# Patient Record
Sex: Female | Born: 1935 | Race: White | Hispanic: No | Smoking: Current every day smoker
Health system: Southern US, Community
[De-identification: ages and names within clinical notes are randomized; demographics above are authoritative.]

## PROBLEM LIST (undated history)

## (undated) DIAGNOSIS — Z923 Personal history of irradiation: Secondary | ICD-10-CM

## (undated) DIAGNOSIS — C3490 Malignant neoplasm of unspecified part of unspecified bronchus or lung: Secondary | ICD-10-CM

## (undated) DIAGNOSIS — I10 Essential (primary) hypertension: Secondary | ICD-10-CM

## (undated) DIAGNOSIS — T4145XA Adverse effect of unspecified anesthetic, initial encounter: Secondary | ICD-10-CM

## (undated) DIAGNOSIS — C801 Malignant (primary) neoplasm, unspecified: Secondary | ICD-10-CM

## (undated) DIAGNOSIS — Z8619 Personal history of other infectious and parasitic diseases: Secondary | ICD-10-CM

## (undated) DIAGNOSIS — G9340 Encephalopathy, unspecified: Secondary | ICD-10-CM

## (undated) DIAGNOSIS — M1990 Unspecified osteoarthritis, unspecified site: Secondary | ICD-10-CM

## (undated) DIAGNOSIS — G4734 Idiopathic sleep related nonobstructive alveolar hypoventilation: Secondary | ICD-10-CM

## (undated) DIAGNOSIS — J449 Chronic obstructive pulmonary disease, unspecified: Secondary | ICD-10-CM

## (undated) DIAGNOSIS — T8859XA Other complications of anesthesia, initial encounter: Secondary | ICD-10-CM

## (undated) DIAGNOSIS — Z720 Tobacco use: Secondary | ICD-10-CM

## (undated) DIAGNOSIS — I739 Peripheral vascular disease, unspecified: Secondary | ICD-10-CM

## (undated) HISTORY — PX: OTHER SURGICAL HISTORY: SHX169

## (undated) HISTORY — PX: APPENDECTOMY: SHX54

## (undated) HISTORY — DX: Idiopathic sleep related nonobstructive alveolar hypoventilation: G47.34

## (undated) HISTORY — DX: Personal history of other infectious and parasitic diseases: Z86.19

## (undated) HISTORY — DX: Personal history of irradiation: Z92.3

## (undated) HISTORY — PX: TONSILLECTOMY: SUR1361

## (undated) HISTORY — DX: Tobacco use: Z72.0

## (undated) HISTORY — DX: Chronic obstructive pulmonary disease, unspecified: J44.9

## (undated) HISTORY — DX: Essential (primary) hypertension: I10

## (undated) HISTORY — DX: Peripheral vascular disease, unspecified: I73.9

---

## 1983-12-11 DIAGNOSIS — Z8619 Personal history of other infectious and parasitic diseases: Secondary | ICD-10-CM

## 1983-12-11 HISTORY — DX: Personal history of other infectious and parasitic diseases: Z86.19

## 1999-12-12 ENCOUNTER — Other Ambulatory Visit: Admission: RE | Admit: 1999-12-12 | Discharge: 1999-12-12 | Payer: Self-pay | Admitting: Cardiology

## 2000-02-01 ENCOUNTER — Ambulatory Visit (HOSPITAL_COMMUNITY): Admission: RE | Admit: 2000-02-01 | Discharge: 2000-02-01 | Payer: Self-pay | Admitting: Gastroenterology

## 2000-02-01 ENCOUNTER — Encounter (INDEPENDENT_AMBULATORY_CARE_PROVIDER_SITE_OTHER): Payer: Self-pay | Admitting: Specialist

## 2003-10-22 ENCOUNTER — Encounter (INDEPENDENT_AMBULATORY_CARE_PROVIDER_SITE_OTHER): Payer: Self-pay | Admitting: Specialist

## 2003-10-22 ENCOUNTER — Encounter: Admission: RE | Admit: 2003-10-22 | Discharge: 2003-10-22 | Payer: Self-pay | Admitting: General Surgery

## 2006-12-10 HISTORY — PX: ELBOW SURGERY: SHX618

## 2007-02-14 ENCOUNTER — Encounter: Admission: RE | Admit: 2007-02-14 | Discharge: 2007-03-16 | Payer: Self-pay | Admitting: Orthopedic Surgery

## 2007-03-17 ENCOUNTER — Encounter: Admission: RE | Admit: 2007-03-17 | Discharge: 2007-05-20 | Payer: Self-pay | Admitting: Orthopedic Surgery

## 2007-07-28 ENCOUNTER — Ambulatory Visit (HOSPITAL_COMMUNITY): Admission: RE | Admit: 2007-07-28 | Discharge: 2007-07-28 | Payer: Self-pay | Admitting: Internal Medicine

## 2007-08-13 ENCOUNTER — Ambulatory Visit: Payer: Self-pay | Admitting: Internal Medicine

## 2007-09-25 ENCOUNTER — Ambulatory Visit: Payer: Self-pay | Admitting: Internal Medicine

## 2009-05-05 ENCOUNTER — Ambulatory Visit: Payer: Self-pay | Admitting: Emergency Medicine

## 2009-05-05 ENCOUNTER — Inpatient Hospital Stay (HOSPITAL_COMMUNITY): Admission: EM | Admit: 2009-05-05 | Discharge: 2009-05-23 | Payer: Self-pay | Admitting: Emergency Medicine

## 2009-05-05 ENCOUNTER — Ambulatory Visit: Payer: Self-pay | Admitting: Cardiology

## 2009-05-08 ENCOUNTER — Encounter (INDEPENDENT_AMBULATORY_CARE_PROVIDER_SITE_OTHER): Payer: Self-pay | Admitting: Internal Medicine

## 2009-06-23 ENCOUNTER — Ambulatory Visit (HOSPITAL_COMMUNITY): Admission: RE | Admit: 2009-06-23 | Discharge: 2009-06-23 | Payer: Self-pay | Admitting: Internal Medicine

## 2009-07-04 ENCOUNTER — Encounter: Admission: RE | Admit: 2009-07-04 | Discharge: 2009-08-30 | Payer: Self-pay | Admitting: Internal Medicine

## 2010-07-15 ENCOUNTER — Encounter: Admission: RE | Admit: 2010-07-15 | Discharge: 2010-07-15 | Payer: Self-pay | Admitting: Internal Medicine

## 2010-12-21 ENCOUNTER — Inpatient Hospital Stay (HOSPITAL_COMMUNITY)
Admission: AD | Admit: 2010-12-21 | Discharge: 2010-12-26 | Payer: Self-pay | Source: Home / Self Care | Attending: Internal Medicine | Admitting: Internal Medicine

## 2010-12-25 LAB — DIFFERENTIAL
Basophils Absolute: 0 10*3/uL (ref 0.0–0.1)
Basophils Absolute: 0 10*3/uL (ref 0.0–0.1)
Basophils Relative: 0 % (ref 0–1)
Basophils Relative: 0 % (ref 0–1)
Eosinophils Absolute: 0 10*3/uL (ref 0.0–0.7)
Eosinophils Absolute: 0 10*3/uL (ref 0.0–0.7)
Eosinophils Relative: 0 % (ref 0–5)
Eosinophils Relative: 0 % (ref 0–5)
Lymphocytes Relative: 7 % — ABNORMAL LOW (ref 12–46)
Lymphocytes Relative: 8 % — ABNORMAL LOW (ref 12–46)
Lymphs Abs: 1 10*3/uL (ref 0.7–4.0)
Lymphs Abs: 1.7 10*3/uL (ref 0.7–4.0)
Monocytes Absolute: 0.3 10*3/uL (ref 0.1–1.0)
Monocytes Absolute: 0.5 10*3/uL (ref 0.1–1.0)
Monocytes Relative: 2 % — ABNORMAL LOW (ref 3–12)
Monocytes Relative: 2 % — ABNORMAL LOW (ref 3–12)
Neutro Abs: 13.2 10*3/uL — ABNORMAL HIGH (ref 1.7–7.7)
Neutro Abs: 20.5 10*3/uL — ABNORMAL HIGH (ref 1.7–7.7)
Neutrophils Relative %: 91 % — ABNORMAL HIGH (ref 43–77)
Neutrophils Relative %: 90 % — ABNORMAL HIGH (ref 43–77)

## 2010-12-25 LAB — COMPREHENSIVE METABOLIC PANEL
ALT: 20 U/L (ref 0–35)
ALT: 34 U/L (ref 0–35)
AST: 27 U/L (ref 0–37)
AST: 35 U/L (ref 0–37)
Albumin: 2.9 g/dL — ABNORMAL LOW (ref 3.5–5.2)
Albumin: 2.5 g/dL — ABNORMAL LOW (ref 3.5–5.2)
Alkaline Phosphatase: 58 U/L (ref 39–117)
Alkaline Phosphatase: 57 U/L (ref 39–117)
BUN: 6 mg/dL (ref 6–23)
BUN: 14 mg/dL (ref 6–23)
CO2: 28 mEq/L (ref 19–32)
CO2: 30 mEq/L (ref 19–32)
Calcium: 9.3 mg/dL (ref 8.4–10.5)
Calcium: 9.2 mg/dL (ref 8.4–10.5)
Chloride: 96 mEq/L (ref 96–112)
Chloride: 88 mEq/L — ABNORMAL LOW (ref 96–112)
Creatinine, Ser: 0.61 mg/dL (ref 0.4–1.2)
Creatinine, Ser: 0.58 mg/dL (ref 0.4–1.2)
GFR calc Af Amer: >60 mL/min (ref >60)
GFR calc Af Amer: >60 mL/min (ref >60)
GFR calc non Af Amer: >60 mL/min (ref >60)
GFR calc non Af Amer: >60 mL/min (ref >60)
Glucose, Bld: 155 mg/dL — ABNORMAL HIGH (ref 70–99)
Glucose, Bld: 170 mg/dL — ABNORMAL HIGH (ref 70–99)
Potassium: 3.6 mEq/L (ref 3.5–5.1)
Potassium: 3.8 mEq/L (ref 3.5–5.1)
Sodium: 134 mEq/L — ABNORMAL LOW (ref 135–145)
Sodium: 126 mEq/L — ABNORMAL LOW (ref 135–145)
Total Bilirubin: 0.6 mg/dL (ref 0.3–1.2)
Total Bilirubin: 0.4 mg/dL (ref 0.3–1.2)
Total Protein: 7.2 g/dL (ref 6.0–8.3)
Total Protein: 6.1 g/dL (ref 6.0–8.3)

## 2010-12-25 LAB — BASIC METABOLIC PANEL
BUN: 15 mg/dL (ref 6–23)
CO2: 28 mEq/L (ref 19–32)
Calcium: 9.3 mg/dL (ref 8.4–10.5)
Chloride: 88 mEq/L — ABNORMAL LOW (ref 96–112)
Creatinine, Ser: 0.68 mg/dL (ref 0.4–1.2)
GFR calc Af Amer: >60 mL/min (ref >60)
GFR calc non Af Amer: >60 mL/min (ref >60)
Glucose, Bld: 128 mg/dL — ABNORMAL HIGH (ref 70–99)
Potassium: 4.5 mEq/L (ref 3.5–5.1)
Sodium: 126 mEq/L — ABNORMAL LOW (ref 135–145)

## 2010-12-25 LAB — GLUCOSE, CAPILLARY
Glucose-Capillary: 293 mg/dL — ABNORMAL HIGH (ref 70–99)
Glucose-Capillary: 169 mg/dL — ABNORMAL HIGH (ref 70–99)
Glucose-Capillary: 240 mg/dL — ABNORMAL HIGH (ref 70–99)
Glucose-Capillary: 174 mg/dL — ABNORMAL HIGH (ref 70–99)
Glucose-Capillary: 169 mg/dL — ABNORMAL HIGH (ref 70–99)
Glucose-Capillary: 235 mg/dL — ABNORMAL HIGH (ref 70–99)
Glucose-Capillary: 152 mg/dL — ABNORMAL HIGH (ref 70–99)
Glucose-Capillary: 108 mg/dL — ABNORMAL HIGH (ref 70–99)
Glucose-Capillary: 133 mg/dL — ABNORMAL HIGH (ref 70–99)
Glucose-Capillary: 174 mg/dL — ABNORMAL HIGH (ref 70–99)
Glucose-Capillary: 181 mg/dL — ABNORMAL HIGH (ref 70–99)

## 2010-12-25 LAB — CBC
HCT: 37.3 % (ref 36.0–46.0)
HCT: 32.8 % — ABNORMAL LOW (ref 36.0–46.0)
HCT: 34.4 % — ABNORMAL LOW (ref 36.0–46.0)
Hemoglobin: 12.8 g/dL (ref 12.0–15.0)
Hemoglobin: 11.2 g/dL — ABNORMAL LOW (ref 12.0–15.0)
Hemoglobin: 11.7 g/dL — ABNORMAL LOW (ref 12.0–15.0)
MCH: 32.7 pg (ref 26.0–34.0)
MCH: 32 pg (ref 26.0–34.0)
MCH: 31.9 pg (ref 26.0–34.0)
MCHC: 34.3 g/dL (ref 30.0–36.0)
MCHC: 34.1 g/dL (ref 30.0–36.0)
MCHC: 34 g/dL (ref 30.0–36.0)
MCV: 95.2 fL (ref 78.0–100.0)
MCV: 93.7 fL (ref 78.0–100.0)
MCV: 93.7 fL (ref 78.0–100.0)
Platelets: 303 10*3/uL (ref 150–400)
Platelets: 320 10*3/uL (ref 150–400)
Platelets: 393 10*3/uL (ref 150–400)
RBC: 3.92 MIL/uL (ref 3.87–5.11)
RBC: 3.5 MIL/uL — ABNORMAL LOW (ref 3.87–5.11)
RBC: 3.67 MIL/uL — ABNORMAL LOW (ref 3.87–5.11)
RDW: 13.5 % (ref 11.5–15.5)
RDW: 13.4 % (ref 11.5–15.5)
RDW: 13.6 % (ref 11.5–15.5)
WBC: 14.5 10*3/uL — ABNORMAL HIGH (ref 4.0–10.5)
WBC: 22.7 10*3/uL — ABNORMAL HIGH (ref 4.0–10.5)
WBC: 20.6 10*3/uL — ABNORMAL HIGH (ref 4.0–10.5)

## 2010-12-25 LAB — PHOSPHORUS: Phosphorus: 3.2 mg/dL (ref 2.3–4.6)

## 2010-12-25 LAB — MAGNESIUM: Magnesium: 1.7 mg/dL (ref 1.5–2.5)

## 2010-12-27 LAB — BASIC METABOLIC PANEL
BUN: 14 mg/dL (ref 6–23)
CO2: 32 mEq/L (ref 19–32)
Calcium: 9.4 mg/dL (ref 8.4–10.5)
Chloride: 99 mEq/L (ref 96–112)
Creatinine, Ser: 0.6 mg/dL (ref 0.4–1.2)
GFR calc Af Amer: >60 mL/min (ref >60)
GFR calc non Af Amer: >60 mL/min (ref >60)
Glucose, Bld: 97 mg/dL (ref 70–99)
Potassium: 4.2 mEq/L (ref 3.5–5.1)
Sodium: 138 mEq/L (ref 135–145)

## 2010-12-27 LAB — CBC
HCT: 36.9 % (ref 36.0–46.0)
Hemoglobin: 12.4 g/dL (ref 12.0–15.0)
MCH: 32.4 pg (ref 26.0–34.0)
MCHC: 33.6 g/dL (ref 30.0–36.0)
MCV: 96.3 fL (ref 78.0–100.0)
Platelets: 379 10*3/uL (ref 150–400)
RBC: 3.83 MIL/uL — ABNORMAL LOW (ref 3.87–5.11)
RDW: 13.9 % (ref 11.5–15.5)
WBC: 14.8 10*3/uL — ABNORMAL HIGH (ref 4.0–10.5)

## 2010-12-27 LAB — GLUCOSE, CAPILLARY
Glucose-Capillary: 77 mg/dL (ref 70–99)
Glucose-Capillary: 127 mg/dL — ABNORMAL HIGH (ref 70–99)
Glucose-Capillary: 190 mg/dL — ABNORMAL HIGH (ref 70–99)
Glucose-Capillary: 107 mg/dL — ABNORMAL HIGH (ref 70–99)
Glucose-Capillary: 77 mg/dL (ref 70–99)

## 2011-01-01 LAB — GLUCOSE, CAPILLARY: Glucose-Capillary: 118 mg/dL — ABNORMAL HIGH (ref 70–99)

## 2011-01-01 NOTE — Discharge Summary (Signed)
NAMEMARIATERESA, [name]             ACCOUNT NO.:  [id number]  MEDICAL RECORD NO.:  [id number]          PATIENT TYPE:  INP  LOCATION:  1435                         FACILITY:  [facility]  PHYSICIAN:  Jarika Robben A. Sylas Twombly, M.D.   DATE OF BIRTH:  08-May-1936  DATE OF ADMISSION:  12/21/2010 DATE OF DISCHARGE:  12/26/2010                              DISCHARGE SUMMARY   DISCHARGE DIAGNOSES: 1. Community-acquired pneumonia. 2. Chronic obstructive pulmonary disease with an acute exacerbation of     chronic destructive pulmonary disease in this admission. 3. Atherosclerotic coronary artery disease by CT scan evidence 4. Type 2 diabetes. 5. Leukocytosis. 6. Hypertension. 7. Hyperlipidemia. 8. Osteoporosis. 9. Peripheral vascular disease. 10.Seasonal allergic rhinitis. 11.Ongoing tobacco abuse.  PROCEDURES:  None.  DISCHARGE MEDICATIONS: 1. Tylenol 650 mg every 6 hours as needed. 2. Nystatin mouthwash 100,000 units/mL 5 mL swish, gargle, and swallow     4 times daily. 3. Prednisone taper 40 mg for 1 day, then 30 mg for 2 days, then 20 mg     for 2 days, and 10 mg for 2 days, then 5 mg for 2 days, then off. 4. Albuterol nebulized one neb every 6 hours as needed. 5. Align over-the-counter for 21 days. 6. Augmentin 875 mg twice daily with food for 10 further days. 7. Lovastatin 40 mg q.h.s. 8. Advair 250/50 one puff twice daily. 9. Aspirin 81 mg daily. 10.Two liters of oxygen per nasal cannula continuous. 11.Cyclobenzaprine 5 mg once daily as needed for muscle spasm. 12.Omega-3 fish oil twice daily. 13.Fosamax 1 tablet weekly as directed. 14.Losartan 50 mg daily. 15.Multivitamin daily. 16.Prilosec 20 mg daily. 17.Albuterol HFA as needed if not doing the albuterol nebulizer. 18.Spiriva 18 mcg one dose inhaled daily. 19.Vitamin D 2000 units daily long-term orally.  HISTORY OF PRESENT ILLNESS:  Andrea [name] a 75 year old female with severe COPD and chronic oxygen use at home with severe and  ongoing tobacco abuse despite an admission a year and half ago with pneumonia and ventilator course where Andrea nearly died during that admission.  Yet, Andrea [name] continued to smoke significant amounts.  Andrea presented to the office with coughing that had become worse.  Andrea had a runny nose,  as a prodrome.  Andrea developed worsening shortness of breath and worsening cough.  Andrea presented to the office where oxygen saturation was 86% on room air.  Andrea required 3 L of oxygen in the office and chest x-ray showed right lower lobe infiltrate and possible right hilar mass.  Andrea was admitted for further care.  HOSPITAL COURSE:  Andrea [name] was admitted to a telemetry bed.  Andrea remained stable from a cardiovascular and pulmonary standpoint.  Andrea was treated with Rocephin and azithromycin intravenously as well as nebulizers and oxygen.  Andrea gradually improved over the next 2 to 3 days.  Andrea had a leukocytosis which improved during her stay.  By December 26, 2010, Andrea was deemed stable for discharge home.  DISCHARGE PHYSICAL EXAM:  VITAL SIGNS:  Temperature 97.5, afebrile, pulse 70, respiratory rate 16, blood pressure 147/67, 93% saturation on 2 L of oxygen.  Blood sugar 107 and 77 on the  2 checks prior to discharge. GENERAL:  Andrea [name] in no acute distress. LUNGS:  Essentially clear to auscultation bilaterally with no wheezes, rales or rhonchi. HEART:  Regular rate and rhythm with no murmur or gallop. ABDOMEN:  Soft, nontender, nondistended with no mass or hepatosplenomegaly. EXTREMITIES:  There was no peripheral edema.  NOTABLE LABORATORY DATA:  On 12/25/2010; sodium 138, potassium 4.2, chloride 99, CO2 32, BUN 14, creatinine 0.6, glucose 97, GFR greater than 60, calcium 9.4.  White count 14,800, hemoglobin 12.4, platelet count 379,000.  White count on December 23, 2010 was 23,000.  Chest x-ray on December 25, 2010 showed decreased and infiltrative density in the right lower midlung zone, atelectasis and  infiltrative densities in the right lung base had increased a little bit.  There was interval decrease in the infiltrate in the left base.  There [name] continued atelectasis and right middle lobe and lingula infiltrate.  Andrea had a CT scan of the chest on December 22, 2010 which showed pneumonia involving the right upper lobe and deep right lower lobe as well as some patchy acid on air- space opacities in the upper lobes bilaterally, right greater than left, all consistent with infection.  Andrea had evidence of COPD and emphysema and Andrea had a left adrenal nodule statistically consistent with adenoma. Also noted, there was a 1.8-cm borderline noncalcified right hilar lymph node.  There was severe three-vessel coronary artery calcification, severe atherosclerosis involving the thoracic and upper abdominal aorta and her branches without aneurysm.  There [name] stenosis of the origin of the left subclavian artery due to calcified and noncalcified plaque.  DISCHARGE INSTRUCTIONS:  Andrea [name] to follow a low-salt, heart- healthy diet.  Andrea [name] to follow with Dr. Waynard Edwards in 1 week in the office. Andrea [name] to call or return to emergency with any significant problems.     Deriyah Kunath A. Waynard Edwards, M.D.     MAP/MEDQ  D:  12/29/2010  T:  12/29/2010  Job:  [id number]  Electronically Signed by Rodrigo Ran M.D. on 01/01/2011 07:59:52 AM

## 2011-03-19 LAB — BLOOD GAS, ARTERIAL
Acid-Base Excess: 9.3 mmol/L — ABNORMAL HIGH (ref 0.0–2.0)
Bicarbonate: 32.1 mEq/L — ABNORMAL HIGH (ref 20.0–24.0)
Drawn by: 24610
FIO2: 0.4 %
FIO2: 0.4 %
MECHVT: 400 mL
O2 Saturation: 93 %
O2 Saturation: 94.9 %
PEEP: 5 cmH2O
PEEP: 5 cmH2O
Patient temperature: 98.6
Patient temperature: 98.6
Pressure support: 5 cmH2O
Pressure support: 5 cmH2O
RATE: 16 resp/min
TCO2: 32.9 mmol/L (ref 0–100)
TCO2: 33.9 mmol/L (ref 0–100)
pCO2 arterial: 55.9 mmHg — ABNORMAL HIGH (ref 35.0–45.0)
pCO2 arterial: 55.9 mmHg — ABNORMAL HIGH (ref 35.0–45.0)
pCO2 arterial: 59.1 mmHg (ref 35.0–45.0)
pH, Arterial: 7.355 (ref 7.350–7.400)
pH, Arterial: 7.387 (ref 7.350–7.400)
pH, Arterial: 7.393 (ref 7.350–7.400)
pO2, Arterial: 63 mmHg — ABNORMAL LOW (ref 80.0–100.0)
pO2, Arterial: 66.5 mmHg — ABNORMAL LOW (ref 80.0–100.0)
pO2, Arterial: 72.7 mmHg — ABNORMAL LOW (ref 80.0–100.0)

## 2011-03-19 LAB — MAGNESIUM
Magnesium: 2.2 mg/dL (ref 1.5–2.5)
Magnesium: 2.3 mg/dL (ref 1.5–2.5)
Magnesium: 2.4 mg/dL (ref 1.5–2.5)

## 2011-03-19 LAB — PHOSPHORUS
Phosphorus: 2.6 mg/dL (ref 2.3–4.6)
Phosphorus: 2.7 mg/dL (ref 2.3–4.6)
Phosphorus: 3.2 mg/dL (ref 2.3–4.6)
Phosphorus: 4.8 mg/dL — ABNORMAL HIGH (ref 2.3–4.6)

## 2011-03-19 LAB — GLUCOSE, CAPILLARY
Glucose-Capillary: 103 mg/dL — ABNORMAL HIGH (ref 70–99)
Glucose-Capillary: 108 mg/dL — ABNORMAL HIGH (ref 70–99)
Glucose-Capillary: 113 mg/dL — ABNORMAL HIGH (ref 70–99)
Glucose-Capillary: 115 mg/dL — ABNORMAL HIGH (ref 70–99)
Glucose-Capillary: 117 mg/dL — ABNORMAL HIGH (ref 70–99)
Glucose-Capillary: 119 mg/dL — ABNORMAL HIGH (ref 70–99)
Glucose-Capillary: 119 mg/dL — ABNORMAL HIGH (ref 70–99)
Glucose-Capillary: 122 mg/dL — ABNORMAL HIGH (ref 70–99)
Glucose-Capillary: 123 mg/dL — ABNORMAL HIGH (ref 70–99)
Glucose-Capillary: 126 mg/dL — ABNORMAL HIGH (ref 70–99)
Glucose-Capillary: 127 mg/dL — ABNORMAL HIGH (ref 70–99)
Glucose-Capillary: 128 mg/dL — ABNORMAL HIGH (ref 70–99)
Glucose-Capillary: 138 mg/dL — ABNORMAL HIGH (ref 70–99)
Glucose-Capillary: 138 mg/dL — ABNORMAL HIGH (ref 70–99)
Glucose-Capillary: 138 mg/dL — ABNORMAL HIGH (ref 70–99)
Glucose-Capillary: 139 mg/dL — ABNORMAL HIGH (ref 70–99)
Glucose-Capillary: 140 mg/dL — ABNORMAL HIGH (ref 70–99)
Glucose-Capillary: 143 mg/dL — ABNORMAL HIGH (ref 70–99)
Glucose-Capillary: 144 mg/dL — ABNORMAL HIGH (ref 70–99)
Glucose-Capillary: 147 mg/dL — ABNORMAL HIGH (ref 70–99)
Glucose-Capillary: 151 mg/dL — ABNORMAL HIGH (ref 70–99)
Glucose-Capillary: 152 mg/dL — ABNORMAL HIGH (ref 70–99)
Glucose-Capillary: 163 mg/dL — ABNORMAL HIGH (ref 70–99)
Glucose-Capillary: 164 mg/dL — ABNORMAL HIGH (ref 70–99)
Glucose-Capillary: 165 mg/dL — ABNORMAL HIGH (ref 70–99)
Glucose-Capillary: 175 mg/dL — ABNORMAL HIGH (ref 70–99)
Glucose-Capillary: 187 mg/dL — ABNORMAL HIGH (ref 70–99)
Glucose-Capillary: 199 mg/dL — ABNORMAL HIGH (ref 70–99)
Glucose-Capillary: 205 mg/dL — ABNORMAL HIGH (ref 70–99)
Glucose-Capillary: 206 mg/dL — ABNORMAL HIGH (ref 70–99)
Glucose-Capillary: 213 mg/dL — ABNORMAL HIGH (ref 70–99)
Glucose-Capillary: 70 mg/dL (ref 70–99)
Glucose-Capillary: 78 mg/dL (ref 70–99)
Glucose-Capillary: 81 mg/dL (ref 70–99)
Glucose-Capillary: 87 mg/dL (ref 70–99)
Glucose-Capillary: 93 mg/dL (ref 70–99)

## 2011-03-19 LAB — CLOSTRIDIUM DIFFICILE EIA

## 2011-03-19 LAB — BASIC METABOLIC PANEL
BUN: 18 mg/dL (ref 6–23)
BUN: 26 mg/dL — ABNORMAL HIGH (ref 6–23)
BUN: 28 mg/dL — ABNORMAL HIGH (ref 6–23)
BUN: 29 mg/dL — ABNORMAL HIGH (ref 6–23)
CO2: 30 mEq/L (ref 19–32)
CO2: 31 mEq/L (ref 19–32)
CO2: 31 mEq/L (ref 19–32)
CO2: 33 mEq/L — ABNORMAL HIGH (ref 19–32)
CO2: 38 mEq/L — ABNORMAL HIGH (ref 19–32)
Calcium: 8.8 mg/dL (ref 8.4–10.5)
Calcium: 8.9 mg/dL (ref 8.4–10.5)
Chloride: 102 mEq/L (ref 96–112)
Chloride: 102 mEq/L (ref 96–112)
Chloride: 103 mEq/L (ref 96–112)
Chloride: 104 mEq/L (ref 96–112)
Chloride: 107 mEq/L (ref 96–112)
Chloride: 110 mEq/L (ref 96–112)
Chloride: 99 mEq/L (ref 96–112)
Creatinine, Ser: 0.54 mg/dL (ref 0.4–1.2)
Creatinine, Ser: 0.68 mg/dL (ref 0.4–1.2)
GFR calc Af Amer: >60 mL/min (ref >60)
GFR calc Af Amer: >60 mL/min (ref >60)
GFR calc non Af Amer: >60 mL/min (ref >60)
GFR calc non Af Amer: >60 mL/min (ref >60)
Glucose, Bld: 128 mg/dL — ABNORMAL HIGH (ref 70–99)
Glucose, Bld: 137 mg/dL — ABNORMAL HIGH (ref 70–99)
Glucose, Bld: 147 mg/dL — ABNORMAL HIGH (ref 70–99)
Glucose, Bld: 87 mg/dL (ref 70–99)
Potassium: 2.7 mEq/L — CL (ref 3.5–5.1)
Potassium: 3.7 mEq/L (ref 3.5–5.1)
Potassium: 4.5 mEq/L (ref 3.5–5.1)
Potassium: 4.6 mEq/L (ref 3.5–5.1)
Potassium: 4.8 mEq/L (ref 3.5–5.1)
Potassium: 4.9 mEq/L (ref 3.5–5.1)
Sodium: 141 mEq/L (ref 135–145)
Sodium: 141 mEq/L (ref 135–145)
Sodium: 142 mEq/L (ref 135–145)
Sodium: 144 mEq/L (ref 135–145)

## 2011-03-19 LAB — DIFFERENTIAL
Basophils Absolute: 0 10*3/uL (ref 0.0–0.1)
Basophils Absolute: 0.1 10*3/uL (ref 0.0–0.1)
Basophils Absolute: 0.1 10*3/uL (ref 0.0–0.1)
Basophils Relative: 1 % (ref 0–1)
Basophils Relative: 1 % (ref 0–1)
Basophils Relative: 1 % (ref 0–1)
Basophils Relative: 1 % (ref 0–1)
Eosinophils Absolute: 0 10*3/uL (ref 0.0–0.7)
Eosinophils Absolute: 0.2 10*3/uL (ref 0.0–0.7)
Eosinophils Absolute: 0.3 10*3/uL (ref 0.0–0.7)
Eosinophils Absolute: 0.3 10*3/uL (ref 0.0–0.7)
Eosinophils Relative: 3 % (ref 0–5)
Eosinophils Relative: 3 % (ref 0–5)
Lymphocytes Relative: 40 % (ref 12–46)
Lymphocytes Relative: 50 % — ABNORMAL HIGH (ref 12–46)
Lymphs Abs: 3.4 10*3/uL (ref 0.7–4.0)
Lymphs Abs: 3.4 10*3/uL (ref 0.7–4.0)
Lymphs Abs: 3.9 10*3/uL (ref 0.7–4.0)
Monocytes Absolute: 0.4 10*3/uL (ref 0.1–1.0)
Monocytes Absolute: 0.5 10*3/uL (ref 0.1–1.0)
Monocytes Absolute: 0.5 10*3/uL (ref 0.1–1.0)
Monocytes Absolute: 0.6 10*3/uL (ref 0.1–1.0)
Monocytes Relative: 5 % (ref 3–12)
Monocytes Relative: 6 % (ref 3–12)
Monocytes Relative: 6 % (ref 3–12)
Monocytes Relative: 6 % (ref 3–12)
Neutro Abs: 3.5 10*3/uL (ref 1.7–7.7)
Neutro Abs: 3.6 10*3/uL (ref 1.7–7.7)
Neutro Abs: 4.6 10*3/uL (ref 1.7–7.7)
Neutro Abs: 8.1 10*3/uL — ABNORMAL HIGH (ref 1.7–7.7)
Neutrophils Relative %: 43 % (ref 43–77)
Neutrophils Relative %: 45 % (ref 43–77)
Neutrophils Relative %: 51 % (ref 43–77)
Neutrophils Relative %: 78 % — ABNORMAL HIGH (ref 43–77)

## 2011-03-19 LAB — COMPREHENSIVE METABOLIC PANEL
ALT: 103 U/L — ABNORMAL HIGH (ref 0–35)
ALT: 109 U/L — ABNORMAL HIGH (ref 0–35)
ALT: 40 U/L — ABNORMAL HIGH (ref 0–35)
ALT: 45 U/L — ABNORMAL HIGH (ref 0–35)
AST: 31 U/L (ref 0–37)
AST: 95 U/L — ABNORMAL HIGH (ref 0–37)
Albumin: 2.1 g/dL — ABNORMAL LOW (ref 3.5–5.2)
Albumin: 2.3 g/dL — ABNORMAL LOW (ref 3.5–5.2)
Albumin: 2.4 g/dL — ABNORMAL LOW (ref 3.5–5.2)
Albumin: 2.5 g/dL — ABNORMAL LOW (ref 3.5–5.2)
Albumin: 2.8 g/dL — ABNORMAL LOW (ref 3.5–5.2)
Alkaline Phosphatase: 41 U/L (ref 39–117)
Alkaline Phosphatase: 42 U/L (ref 39–117)
Alkaline Phosphatase: 59 U/L (ref 39–117)
BUN: 10 mg/dL (ref 6–23)
BUN: 16 mg/dL (ref 6–23)
BUN: 7 mg/dL (ref 6–23)
CO2: 29 mEq/L (ref 19–32)
CO2: 32 mEq/L (ref 19–32)
Calcium: 9 mg/dL (ref 8.4–10.5)
Calcium: 9.3 mg/dL (ref 8.4–10.5)
Chloride: 102 mEq/L (ref 96–112)
Creatinine, Ser: 0.67 mg/dL (ref 0.4–1.2)
Creatinine, Ser: 0.77 mg/dL (ref 0.4–1.2)
GFR calc Af Amer: >60 mL/min (ref >60)
GFR calc Af Amer: >60 mL/min (ref >60)
GFR calc Af Amer: >60 mL/min (ref >60)
GFR calc non Af Amer: >60 mL/min (ref >60)
GFR calc non Af Amer: >60 mL/min (ref >60)
Glucose, Bld: 76 mg/dL (ref 70–99)
Glucose, Bld: 86 mg/dL (ref 70–99)
Glucose, Bld: 95 mg/dL (ref 70–99)
Glucose, Bld: 98 mg/dL (ref 70–99)
Potassium: 4.5 mEq/L (ref 3.5–5.1)
Potassium: 4.6 mEq/L (ref 3.5–5.1)
Potassium: 4.6 mEq/L (ref 3.5–5.1)
Sodium: 136 mEq/L (ref 135–145)
Sodium: 140 mEq/L (ref 135–145)
Sodium: 140 mEq/L (ref 135–145)
Sodium: 141 mEq/L (ref 135–145)
Sodium: 143 mEq/L (ref 135–145)
Total Bilirubin: 0.5 mg/dL (ref 0.3–1.2)
Total Bilirubin: 0.6 mg/dL (ref 0.3–1.2)
Total Protein: 5.7 g/dL — ABNORMAL LOW (ref 6.0–8.3)
Total Protein: 5.8 g/dL — ABNORMAL LOW (ref 6.0–8.3)
Total Protein: 6 g/dL (ref 6.0–8.3)
Total Protein: 6.6 g/dL (ref 6.0–8.3)

## 2011-03-19 LAB — CBC
HCT: 30.9 % — ABNORMAL LOW (ref 36.0–46.0)
HCT: 35.2 % — ABNORMAL LOW (ref 36.0–46.0)
HCT: 35.9 % — ABNORMAL LOW (ref 36.0–46.0)
Hemoglobin: 12.1 g/dL (ref 12.0–15.0)
Hemoglobin: 12.1 g/dL (ref 12.0–15.0)
Hemoglobin: 12.3 g/dL (ref 12.0–15.0)
Hemoglobin: 12.6 g/dL (ref 12.0–15.0)
Hemoglobin: 12.7 g/dL (ref 12.0–15.0)
MCHC: 33.8 g/dL (ref 30.0–36.0)
MCHC: 33.8 g/dL (ref 30.0–36.0)
MCHC: 33.8 g/dL (ref 30.0–36.0)
MCHC: 34 g/dL (ref 30.0–36.0)
MCHC: 34.1 g/dL (ref 30.0–36.0)
MCHC: 34.2 g/dL (ref 30.0–36.0)
MCHC: 34.3 g/dL (ref 30.0–36.0)
MCV: 100.2 fL — ABNORMAL HIGH (ref 78.0–100.0)
MCV: 98.6 fL (ref 78.0–100.0)
MCV: 98.9 fL (ref 78.0–100.0)
MCV: 99.1 fL (ref 78.0–100.0)
MCV: 99.4 fL (ref 78.0–100.0)
MCV: 99.9 fL (ref 78.0–100.0)
Platelets: 244 10*3/uL (ref 150–400)
Platelets: 252 10*3/uL (ref 150–400)
Platelets: 279 10*3/uL (ref 150–400)
Platelets: 286 10*3/uL (ref 150–400)
Platelets: 287 10*3/uL (ref 150–400)
Platelets: 288 10*3/uL (ref 150–400)
Platelets: 296 10*3/uL (ref 150–400)
Platelets: 312 10*3/uL (ref 150–400)
RBC: 3.59 MIL/uL — ABNORMAL LOW (ref 3.87–5.11)
RBC: 3.62 MIL/uL — ABNORMAL LOW (ref 3.87–5.11)
RBC: 3.8 MIL/uL — ABNORMAL LOW (ref 3.87–5.11)
RDW: 15.1 % (ref 11.5–15.5)
RDW: 15.1 % (ref 11.5–15.5)
RDW: 15.1 % (ref 11.5–15.5)
RDW: 15.3 % (ref 11.5–15.5)
RDW: 15.5 % (ref 11.5–15.5)
RDW: 15.6 % — ABNORMAL HIGH (ref 11.5–15.5)
RDW: 15.9 % — ABNORMAL HIGH (ref 11.5–15.5)
WBC: 13.4 10*3/uL — ABNORMAL HIGH (ref 4.0–10.5)
WBC: 14.6 10*3/uL — ABNORMAL HIGH (ref 4.0–10.5)
WBC: 18.6 10*3/uL — ABNORMAL HIGH (ref 4.0–10.5)
WBC: 19.6 10*3/uL — ABNORMAL HIGH (ref 4.0–10.5)
WBC: 9.2 10*3/uL (ref 4.0–10.5)

## 2011-03-19 LAB — POCT I-STAT 3, ART BLOOD GAS (G3+)
Acid-Base Excess: 10 mmol/L — ABNORMAL HIGH (ref 0.0–2.0)
Bicarbonate: 36 mEq/L — ABNORMAL HIGH (ref 20.0–24.0)
Bicarbonate: 37.5 mEq/L — ABNORMAL HIGH (ref 20.0–24.0)
Bicarbonate: 38.5 mEq/L — ABNORMAL HIGH (ref 20.0–24.0)
O2 Saturation: 91 %
Patient temperature: 37
Patient temperature: 99.1
TCO2: 38 mmol/L (ref 0–100)
TCO2: 39 mmol/L (ref 0–100)
pH, Arterial: 7.412 — ABNORMAL HIGH (ref 7.350–7.400)
pH, Arterial: 7.457 — ABNORMAL HIGH (ref 7.350–7.400)
pH, Arterial: 7.532 — ABNORMAL HIGH (ref 7.350–7.400)
pO2, Arterial: 61 mmHg — ABNORMAL LOW (ref 80.0–100.0)
pO2, Arterial: 71 mmHg — ABNORMAL LOW (ref 80.0–100.0)

## 2011-03-19 LAB — URINALYSIS, ROUTINE W REFLEX MICROSCOPIC
Bilirubin Urine: NEGATIVE
Ketones, ur: 15 mg/dL — AB
Nitrite: NEGATIVE
Protein, ur: 100 mg/dL — AB
pH: 5.5 (ref 5.0–8.0)

## 2011-03-19 LAB — HEPATIC FUNCTION PANEL
AST: 67 U/L — ABNORMAL HIGH (ref 0–37)
Albumin: 2.7 g/dL — ABNORMAL LOW (ref 3.5–5.2)
Alkaline Phosphatase: 40 U/L (ref 39–117)
Bilirubin, Direct: 0.1 mg/dL (ref 0.0–0.3)
Total Bilirubin: 0.5 mg/dL (ref 0.3–1.2)

## 2011-03-19 LAB — URINE MICROSCOPIC-ADD ON

## 2011-03-20 LAB — URINE MICROSCOPIC-ADD ON

## 2011-03-20 LAB — URINE CULTURE: Colony Count: NO GROWTH

## 2011-03-20 LAB — COMPREHENSIVE METABOLIC PANEL
ALT: 41 U/L — ABNORMAL HIGH (ref 0–35)
ALT: 46 U/L — ABNORMAL HIGH (ref 0–35)
AST: 43 U/L — ABNORMAL HIGH (ref 0–37)
Albumin: 2.2 g/dL — ABNORMAL LOW (ref 3.5–5.2)
BUN: 17 mg/dL (ref 6–23)
BUN: 9 mg/dL (ref 6–23)
CO2: 28 mEq/L (ref 19–32)
CO2: 36 mEq/L — ABNORMAL HIGH (ref 19–32)
Calcium: 8.5 mg/dL (ref 8.4–10.5)
Calcium: 9.1 mg/dL (ref 8.4–10.5)
Calcium: 9.4 mg/dL (ref 8.4–10.5)
Chloride: 102 mEq/L (ref 96–112)
Creatinine, Ser: 0.86 mg/dL (ref 0.4–1.2)
GFR calc Af Amer: >60 mL/min (ref >60)
GFR calc non Af Amer: >60 mL/min (ref >60)
GFR calc non Af Amer: >60 mL/min (ref >60)
Glucose, Bld: 163 mg/dL — ABNORMAL HIGH (ref 70–99)
Glucose, Bld: 168 mg/dL — ABNORMAL HIGH (ref 70–99)
Sodium: 136 mEq/L (ref 135–145)
Sodium: 145 mEq/L (ref 135–145)
Total Protein: 6.4 g/dL (ref 6.0–8.3)
Total Protein: 7.8 g/dL (ref 6.0–8.3)

## 2011-03-20 LAB — POCT I-STAT 3, VENOUS BLOOD GAS (G3P V)
Bicarbonate: 31.1 mEq/L — ABNORMAL HIGH (ref 20.0–24.0)
O2 Saturation: 100 %
Patient temperature: 99.3
TCO2: 33 mmol/L (ref 0–100)
pH, Ven: 7.201 — ABNORMAL LOW (ref 7.250–7.300)

## 2011-03-20 LAB — CARDIAC PANEL(CRET KIN+CKTOT+MB+TROPI)
CK, MB: 5.2 ng/mL — ABNORMAL HIGH (ref 0.3–4.0)
CK, MB: 6.8 ng/mL — ABNORMAL HIGH (ref 0.3–4.0)
Relative Index: 2.5 (ref 0.0–2.5)
Relative Index: 4.1 — ABNORMAL HIGH (ref 0.0–2.5)
Total CK: 232 U/L — ABNORMAL HIGH (ref 7–177)
Total CK: 276 U/L — ABNORMAL HIGH (ref 7–177)
Troponin I: 0.17 ng/mL — ABNORMAL HIGH (ref 0.00–0.06)
Troponin I: 0.23 ng/mL — ABNORMAL HIGH (ref 0.00–0.06)

## 2011-03-20 LAB — POCT I-STAT 3, ART BLOOD GAS (G3+)
Acid-Base Excess: 1 mmol/L (ref 0.0–2.0)
Acid-Base Excess: 9 mmol/L — ABNORMAL HIGH (ref 0.0–2.0)
Acid-base deficit: 2 mmol/L (ref 0.0–2.0)
Acid-base deficit: 2 mmol/L (ref 0.0–2.0)
Acid-base deficit: 2 mmol/L (ref 0.0–2.0)
Bicarbonate: 24.7 mEq/L — ABNORMAL HIGH (ref 20.0–24.0)
Bicarbonate: 25.5 mEq/L — ABNORMAL HIGH (ref 20.0–24.0)
Bicarbonate: 26.2 mEq/L — ABNORMAL HIGH (ref 20.0–24.0)
Bicarbonate: 35.5 mEq/L — ABNORMAL HIGH (ref 20.0–24.0)
O2 Saturation: 95 %
O2 Saturation: 97 %
Patient temperature: 97.6
Patient temperature: 98.1
Patient temperature: 98.6
Patient temperature: 98.7
Patient temperature: 98.8
Patient temperature: 98.8
TCO2: 26 mmol/L (ref 0–100)
TCO2: 28 mmol/L (ref 0–100)
pCO2 arterial: 36.8 mmHg (ref 35.0–45.0)
pCO2 arterial: 41.6 mmHg (ref 35.0–45.0)
pCO2 arterial: 52.8 mmHg — ABNORMAL HIGH (ref 35.0–45.0)
pH, Arterial: 7.283 — ABNORMAL LOW (ref 7.350–7.400)
pH, Arterial: 7.294 — ABNORMAL LOW (ref 7.350–7.400)
pH, Arterial: 7.299 — ABNORMAL LOW (ref 7.350–7.400)
pH, Arterial: 7.448 — ABNORMAL HIGH (ref 7.350–7.400)
pO2, Arterial: 100 mmHg (ref 80.0–100.0)
pO2, Arterial: 117 mmHg — ABNORMAL HIGH (ref 80.0–100.0)
pO2, Arterial: 91 mmHg (ref 80.0–100.0)

## 2011-03-20 LAB — RAPID URINE DRUG SCREEN, HOSP PERFORMED
Barbiturates: NOT DETECTED
Opiates: NOT DETECTED

## 2011-03-20 LAB — GLUCOSE, CAPILLARY
Glucose-Capillary: 138 mg/dL — ABNORMAL HIGH (ref 70–99)
Glucose-Capillary: 163 mg/dL — ABNORMAL HIGH (ref 70–99)
Glucose-Capillary: 163 mg/dL — ABNORMAL HIGH (ref 70–99)
Glucose-Capillary: 166 mg/dL — ABNORMAL HIGH (ref 70–99)
Glucose-Capillary: 167 mg/dL — ABNORMAL HIGH (ref 70–99)
Glucose-Capillary: 179 mg/dL — ABNORMAL HIGH (ref 70–99)
Glucose-Capillary: 184 mg/dL — ABNORMAL HIGH (ref 70–99)
Glucose-Capillary: 187 mg/dL — ABNORMAL HIGH (ref 70–99)
Glucose-Capillary: 190 mg/dL — ABNORMAL HIGH (ref 70–99)
Glucose-Capillary: 192 mg/dL — ABNORMAL HIGH (ref 70–99)

## 2011-03-20 LAB — LEGIONELLA ANTIGEN, URINE: Legionella Antigen, Urine: NEGATIVE

## 2011-03-20 LAB — CBC
HCT: 32.4 % — ABNORMAL LOW (ref 36.0–46.0)
HCT: 33.3 % — ABNORMAL LOW (ref 36.0–46.0)
HCT: 33.9 % — ABNORMAL LOW (ref 36.0–46.0)
Hemoglobin: 11.2 g/dL — ABNORMAL LOW (ref 12.0–15.0)
Hemoglobin: 11.6 g/dL — ABNORMAL LOW (ref 12.0–15.0)
Hemoglobin: 14.2 g/dL (ref 12.0–15.0)
MCHC: 34.2 g/dL (ref 30.0–36.0)
MCHC: 34.3 g/dL (ref 30.0–36.0)
MCV: 98.4 fL (ref 78.0–100.0)
MCV: 98.5 fL (ref 78.0–100.0)
Platelets: 240 10*3/uL (ref 150–400)
Platelets: 253 10*3/uL (ref 150–400)
Platelets: 258 10*3/uL (ref 150–400)
RBC: 3.09 MIL/uL — ABNORMAL LOW (ref 3.87–5.11)
RBC: 3.29 MIL/uL — ABNORMAL LOW (ref 3.87–5.11)
RBC: 4.21 MIL/uL (ref 3.87–5.11)
RDW: 14.4 % (ref 11.5–15.5)
RDW: 14.6 % (ref 11.5–15.5)
WBC: 12.4 10*3/uL — ABNORMAL HIGH (ref 4.0–10.5)
WBC: 16.4 10*3/uL — ABNORMAL HIGH (ref 4.0–10.5)
WBC: 9.2 10*3/uL (ref 4.0–10.5)

## 2011-03-20 LAB — BASIC METABOLIC PANEL
BUN: 12 mg/dL (ref 6–23)
BUN: 17 mg/dL (ref 6–23)
Chloride: 95 mEq/L — ABNORMAL LOW (ref 96–112)
Creatinine, Ser: 0.65 mg/dL (ref 0.4–1.2)
GFR calc non Af Amer: >60 mL/min (ref >60)
Glucose, Bld: 138 mg/dL — ABNORMAL HIGH (ref 70–99)
Potassium: 3.3 mEq/L — ABNORMAL LOW (ref 3.5–5.1)
Potassium: 4.4 mEq/L (ref 3.5–5.1)
Sodium: 146 mEq/L — ABNORMAL HIGH (ref 135–145)

## 2011-03-20 LAB — CULTURE, RESPIRATORY W GRAM STAIN

## 2011-03-20 LAB — DIFFERENTIAL
Eosinophils Absolute: 0 10*3/uL (ref 0.0–0.7)
Lymphocytes Relative: 15 % (ref 12–46)
Lymphs Abs: 2.7 10*3/uL (ref 0.7–4.0)
Monocytes Relative: 6 % (ref 3–12)
Neutro Abs: 14.2 10*3/uL — ABNORMAL HIGH (ref 1.7–7.7)
Neutrophils Relative %: 79 % — ABNORMAL HIGH (ref 43–77)

## 2011-03-20 LAB — STREP PNEUMONIAE URINARY ANTIGEN: Strep Pneumo Urinary Antigen: NEGATIVE

## 2011-03-20 LAB — PROTIME-INR
INR: 1.3 (ref 0.00–1.49)
Prothrombin Time: 16.2 seconds — ABNORMAL HIGH (ref 11.6–15.2)

## 2011-03-20 LAB — OSMOLALITY: Osmolality: 275 mOsm/kg (ref 275–300)

## 2011-03-20 LAB — URINALYSIS, ROUTINE W REFLEX MICROSCOPIC
Glucose, UA: NEGATIVE mg/dL
Ketones, ur: 15 mg/dL — AB
pH: 6 (ref 5.0–8.0)

## 2011-03-20 LAB — TSH: TSH: 0.581 u[IU]/mL (ref 0.350–4.500)

## 2011-03-20 LAB — PHOSPHORUS: Phosphorus: 2.9 mg/dL (ref 2.3–4.6)

## 2011-03-20 LAB — CULTURE, BAL-QUANTITATIVE W GRAM STAIN

## 2011-03-20 LAB — POCT CARDIAC MARKERS
CKMB, poc: 37.8 ng/mL (ref 1.0–8.0)
Myoglobin, poc: >500 ng/mL (ref 12–200)

## 2011-03-20 LAB — CULTURE, BLOOD (ROUTINE X 2)

## 2011-03-20 LAB — APTT: aPTT: 40 seconds — ABNORMAL HIGH (ref 24–37)

## 2011-04-24 NOTE — Discharge Summary (Signed)
Andrea [name], Andrea [name]             ACCOUNT NO.:  [id number]   MEDICAL RECORD NO.:  [id number]          PATIENT TYPE:  INP   LOCATION:  5155                         FACILITY:  [facility]   PHYSICIAN:  Mark A. Perini, M.D.   DATE OF BIRTH:  21-Jul-1936   DATE OF ADMISSION:  05/05/2009  DATE OF DISCHARGE:  05/23/2009                               DISCHARGE SUMMARY   DISCHARGE DIAGNOSES:  1. 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  2. Elevated liver function test improving at the time of discharge.  3. Hyperglycemia.  4. Diarrhea, improved at the time of discharge.  5. Delirium, improved at the time of discharge.  6. Tobacco abuse.  7. Chronic hypoxia.  8. Dysphagia.  9. Anemia.  10.Type 2 diabetes.   PROCEDURES:  1. Mechanical ventilation.  2. Swallowing study on May 17, 2009.  3. Abdominal ultrasound on May 06, 2009, which was unremarkable.  4. CT scan of the head on May 05, 2009, which showed no acute      intracranial abnormality, but did show some nasal cavities possibly      due to nasal polyposis.  5. Placement and eventual removal of central venous access.   DISCHARGE MEDICATIONS:  1. Lovastatin 40 mg every other evening.  2. Alendronate 70 mg once weekly as directed.  3. Advair 250/50 one puff twice daily.  4. Albuterol two puffs every 4 hours as needed.  5. Spiriva 18 mcg one puff/dose daily.  6. Oxygen 2 liters per minute nasal cannula, continuous.  7. Vitamin D and multivitamins to be resumed as previously.  She is to      discontinue smoking.  8. She is take Prilosec over-the-counter as needed for stomach acid      issues.  9. Aspirin 81 mg daily.  10.Tylenol 650 mg every 6 hours as needed for pain or fever.   HISTORY OF PRESENT ILLNESS:  Andrea [name] is a 75 year old female with  significant COPD and ongoing tobacco use who  was found down at home by a  caregiver.  She was poorly responsive and she would was brought to the  emergency room.  She was intubated.   HOSPITAL COURSE:  Andrea [name] was admitted to the Pulmonary Critical Care  Service.  She was treated with Rocephin and azithromycin.  Abundant H.  flu was obtained from her tracheal aspirate from May 05, 2009.  Urinary  strep pneumonia study was negative.  She was treated with tube feeds and  free water boluses as well as antibiotics and nebulizer treatments and  ventilator therapy.  She developed diarrhea and was treated empirically  for C. diff colitis, however, this was never proven and her diarrhea  resolved fairly, promptly, arguing against true C. diff colitis.  On  May 12, 2009 and May 13, 2009, she was being actively weaned from the  ventilator.  On May 15, 2009, it was determined that she would be a do  not resuscitate code status and if she  failed extubation that she would  not be reintubated or have a tracheostomy.  She was extubated on May 16, 2009.  Hypokalemia was corrected.  She was treated for agitation with  Seroquel and benzodiazepines.  On May 17, 2009, she was placed on a  dysphagia III mechanical soft diet with nectar-thick liquids.  We  assumed care on May 18, 2009 from the Critical Care Service.  At that  time, a erector of rectal Foley was discontinued.  Physical Therapy and  Occupational Therapy were consulted.  Her steroids were being tapered.  Her Klonopin and Seroquel were also tapered to off over the next few  days.  Progressed over the weekend of May 21, 2009 and on May 23, 2009, she was deemed stable for discharge home.   DISCHARGE PHYSICAL EXAM:  VITAL SIGNS:  Temperature 98.2, pulse 108,  respiratory rate 16, blood pressure 110/72, 95% saturation on 3 liters.  Blood sugars range from 79-175.  GENERAL:  She was alert and oriented x3.  LUNGS:  Clear to auscultation bilaterally.  HEART:  Regular with no murmur.   ABDOMEN:  Soft and nontender.  EXTREMITIES:  She had some mild bruising of her extremities, be dressing  from her left neck, IV access was removed with no sign of infection.   DISCHARGE LABORATORY DATA:  AST was down to 67, ALT was down to 100.  White count 8.2, hemoglobin 12.8, platelet count 252,000.  Serum glucose  was 98 and the rest of her BMET was normal at that time.  Albumin was  2.7.   DISCHARGE INSTRUCTIONS:  Andrea [name] is to follow a diabetic diet.  She is  on a dysphasic diet per Speech Therapy.  She is to call with any  recurrent problems.  She is to increase her activity slowly.  She is to  follow up in our office in 1-2 weeks.  She left the hospital as a full  code status.      Mark A. Perini, M.D.  Electronically Signed     MAP/MEDQ  D:  06/08/2009  T:  06/08/2009  Job:  [id number]

## 2011-04-24 NOTE — Assessment & Plan Note (Signed)
[facility]                             PULMONARY OFFICE NOTE   TONNETTE, ZWIEBEL                    MRN:          [id number]  DATE:09/25/2007                            DOB:          [birth year]    A 75 year old white female with a baseline FEV1 of 0.88 documented  July 28, 2007 who continues to smoke and who did not benefit from  Advair.  However, she did reduce the smoking down to 1/2 pack per day  and could not name anything today that limits her in terms of activities  of daily living.  She has not had any recent exacerbations of cough or  variability in terms of her dyspnea or need for any form of rescue  therapy.   PHYSICAL EXAMINATION:  She is a stoic ambulatory white female in no  acute distress.  She had afebrile vital signs.  HEENT:  Unremarkable, oropharynx clear.  LUNGS:  Fields reveal diminished breath sounds bilaterally, no wheezing.  Regular rhythm without murmur, gallop, rub.  ABDOMEN:  Soft, benign.  EXTREMITIES:  Warm without calf tenderness, cyanosis, clubbing, edema.   IMPRESSION:  1. This patient has at least moderate chronic obstructive pulmonary      disease with nocturnal hypoxemia previously documented and      continues to therefore use oxygen at bedtime.  2. The fact that she has no significant dyspnea with activity tells me      that she is relatively inactive and she could not actually name one      thing that she would like to do but her breathing holds her back.      Since she did not respond in a convincing fashion to Advair and did      not notice the benefit nor the lack of benefit when she stopped it,      I do not believe there is any role here for Advair except to      recommend it if she begins having exacerbations.   Obviously the most important aspect of her care is to fully commit to  smoking cessation.  She says she is making progress but not there yet.  I therefore referred her back to Dr. Rodrigo Ran for regular medical  followup and we would be happy to see her back here at Dr. Laurey Morale  discretion on a p.r.n. basis.     Charlaine Dalton. Sherene Sires, MD, [facility]  Electronically Signed    MBW/MedQ  DD: 09/25/2007  DT: 09/26/2007  Job #: [id number]   cc:   Loraine Leriche A. Perini, M.D.

## 2011-04-24 NOTE — Assessment & Plan Note (Signed)
[facility]                             PULMONARY OFFICE NOTE   Andrea [name], Andrea [name]                    MRN:          [id number]  DATE:08/13/2007                            DOB:          Apr 17, 1936    PULMONARY CONSULTATION:   CHIEF COMPLAINT:  Dyspnea.   HISTORY:  This is a 75 year old white female with moderate obesity, a  long-term smoker who has had a progressive decline over the last year,  more consistent with fatigue than dyspnea, in terms of living activity.  Intermittently, she has severe coughing paroxysms that occur all hours  of the day and night but are productive of minimal white sputum.  This  problem has been worse in the last several weeks, but between paroxysms,  she still feels run down and cannot get her breath like she use to.  She has been given Chantix but has not actually set a quit date or start  of the Chantix yet.   She denies any pleuritic pain, fevers, chills, sweats, or classic  pattern of environmental or weather-related changes in her breathing.   PAST MEDICAL HISTORY:  Significant for the absence of chronic heart  disease or diabetes.  She does have hyperlipidemia.   ALLERGIES:  None known.   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   FAMILY HISTORY:  Positive for colon cancer in her mother's side of the  family.   REVIEW OF SYSTEMS:  Taken in detail on the work sheet and essentially  negative except as outlined above.   PHYSICAL EXAMINATION:  This is a slightly anxious white female in no  acute distress.  She is afebrile with normal vital signs.  HEENT:  Unremarkable.  Her oropharynx is clear.  Nasal turbinates are  normal.  Ear canals are clear bilaterally.  Dentition was intact.  NECK:  Supple without cervical adenopathy or tenderness.  Trachea is  midline.  No thyromegaly.  LUNGS:  Diminished breath sounds bilaterally.   There is no wheezing.  HEART:  Regular rhythm without murmur, rub or gallop.  ABDOMEN:  Obese, soft, benign.  EXTREMITIES:  Warm without calf tenderness, clubbing, cyanosis or edema.   Chest x-ray is reported to be normal at Dr. Laurey Morale office within the  last three months, not available today.   Overnight pulse oximetry indicates desaturation to 48%.  Lung functions  were done on August 18th, indicating an FEV1 of 0.88 but a ratio of 60%.   IMPRESSION:  This patient has moderate chronic obstructive pulmonary  disease with probable restrictive component secondary to morbid obesity  and is still actively smoking with what appears to be intermittent acute  exacerbation of chronic obstructive pulmonary disease with bronchitis.  I think it would be reasonable to challenge her with Advair 250/50  b.i.d., to see to what extent it improves her best-day function, reduces  exacerbation rate, and helps her with her cough.  One caveat is that it  may backfire because of reflux, which I found Advair will bring  out in  patients who are prone to it because of the effect on the upper airway.   I therefore spent extra time educating her regarding the impact of  reflux and her risk because of her abdominal compartment obesity for  having reflux, advised diet plus rinse and gargle after Advair.  Advised  that if she coughs, to use Mucinex DM 1-2 q.12h., and if she continues  to cough, to add empiric Prilosec over the counter 30 minutes before  breakfast and supper until she returns for followup here in six weeks.   I also reviewed a GERD diet and lifestyle modifications, the first of  which is to stop smoking in all forms.   The nocturnal hypoxemia is not surprising, given her abdominal girth and  smoking history.  I note that she has been a full seven hours at a  saturation below 88% during this study and would probably have it  repeated on oxygen to see if the problem has been eliminated at some   point but obviously, the emphasis here is on smoking cessation rather  than considering additional pulmonary interventions.   I spent extra time teaching her optimal DPI technique, which she seemed  to master fairly quickly, and we will see her back here in 4-6 weeks for  followup, sooner if needed.     Charlaine Dalton. Sherene Sires, MD, [facility]  Electronically Signed    MBW/MedQ  DD: 08/13/2007  DT: 08/13/2007  Job #: [id number]   cc:   Loraine Leriche A. Perini, M.D.

## 2011-09-26 ENCOUNTER — Other Ambulatory Visit: Payer: Self-pay | Admitting: Internal Medicine

## 2011-09-26 DIAGNOSIS — R911 Solitary pulmonary nodule: Secondary | ICD-10-CM

## 2011-09-28 ENCOUNTER — Other Ambulatory Visit: Payer: Self-pay

## 2011-11-22 ENCOUNTER — Other Ambulatory Visit (HOSPITAL_COMMUNITY): Payer: Self-pay | Admitting: Internal Medicine

## 2011-11-22 DIAGNOSIS — R918 Other nonspecific abnormal finding of lung field: Secondary | ICD-10-CM

## 2011-11-28 ENCOUNTER — Other Ambulatory Visit: Payer: Self-pay

## 2011-11-28 ENCOUNTER — Encounter (HOSPITAL_COMMUNITY)
Admission: RE | Admit: 2011-11-28 | Discharge: 2011-11-28 | Disposition: A | Discharge status: Home / Self Care | Payer: Medicare Other | Source: Ambulatory Visit | Attending: Internal Medicine | Admitting: Internal Medicine

## 2011-11-28 ENCOUNTER — Encounter: Payer: Self-pay | Admitting: Thoracic Surgery

## 2011-11-28 ENCOUNTER — Other Ambulatory Visit: Payer: Self-pay | Admitting: Thoracic Surgery

## 2011-11-28 ENCOUNTER — Ambulatory Visit (INDEPENDENT_AMBULATORY_CARE_PROVIDER_SITE_OTHER): Payer: Medicare Other | Admitting: Thoracic Surgery

## 2011-11-28 VITALS — BP 130/67 | HR 59 | Resp 16 | Ht 59.5 in | Wt 134.0 lb

## 2011-11-28 DIAGNOSIS — D381 Neoplasm of uncertain behavior of trachea, bronchus and lung: Secondary | ICD-10-CM

## 2011-11-28 DIAGNOSIS — R918 Other nonspecific abnormal finding of lung field: Secondary | ICD-10-CM

## 2011-11-28 DIAGNOSIS — C7949 Secondary malignant neoplasm of other parts of nervous system: Secondary | ICD-10-CM

## 2011-11-28 DIAGNOSIS — C3410 Malignant neoplasm of upper lobe, unspecified bronchus or lung: Secondary | ICD-10-CM | POA: Insufficient documentation

## 2011-11-28 DIAGNOSIS — R222 Localized swelling, mass and lump, trunk: Secondary | ICD-10-CM | POA: Insufficient documentation

## 2011-11-28 DIAGNOSIS — R51 Headache: Secondary | ICD-10-CM

## 2011-11-28 DIAGNOSIS — C7931 Secondary malignant neoplasm of brain: Secondary | ICD-10-CM

## 2011-11-28 LAB — CREATININE, SERUM: Creat: 0.76 mg/dL (ref 0.50–1.10)

## 2011-11-28 LAB — GLUCOSE, CAPILLARY: Glucose-Capillary: 105 mg/dL — ABNORMAL HIGH (ref 70–99)

## 2011-11-28 LAB — BUN: BUN: 17 mg/dL (ref 6–23)

## 2011-11-28 MED ORDER — FLUDEOXYGLUCOSE F - 18 (FDG) INJECTION
16.8000 | Freq: Once | INTRAVENOUS | Status: AC | PRN
  Administered 2011-11-28: 16.8 via INTRAVENOUS

## 2011-11-28 NOTE — Progress Notes (Signed)
PCP is [name], MD, MD Referring Provider is Perini, Redge Gainer, MD  Chief Complaint  Patient presents with  . Lung Mass    Referrl from Dr Waynard Edwards for eval on Lung mass    HPI: This 75 year old patient was in the hospital with severe pneumonia in early 2012.SHe has a history of chronic obstructive pulmonary disease. She is on home oxygen. She was found to have a left upper lobe nodule CT scan actually revealed 2 left upper lobe nodules and is a small 3 mm nodule in the left lower lobe. PET scan was positive in the left upper lobe nodules. She is referred here for evaluation. She's had no mouth is fever chills or excessive sputum.   No past medical history on file.  No past surgical history on file.  No family history on file.  Social History History  Substance Use Topics  . Smoking status: Current Everyday Smoker -- 1.0 packs/day for 40 years    Types: Cigarettes  . Smokeless tobacco: Never Used  . Alcohol Use: No    No current outpatient prescriptions on file.   No current facility-administered medications for this visit.   Facility-Administered Medications Ordered in Other Visits  Medication Dose Route Frequency Provider Last Rate Last Dose  . fludeoxyglucose F - 18 (FDG) injection 16.8 milli Curie  16.8 milli Curie Intravenous Once PRN Medication Radiologist   16.8 milli Curie at 11/28/11 [phone]    No Known Allergies  Review of Systems  Constitutional: Negative.   Eyes: Negative.   Respiratory: Positive for shortness of breath and stridor.   Cardiovascular: Negative.   Genitourinary: Negative.   Musculoskeletal: Negative.   Neurological: Negative.   Hematological: Negative.   Psychiatric/Behavioral: Positive for agitation.    BP 130/67  Pulse 59  Resp 16  Ht 4' 11.5" (1.511 m)  Wt 134 lb (60.782 kg)  BMI 26.61 kg/m2  SpO2 92% Physical Exam  Constitutional: She is oriented to person, place, and time. She appears well-developed.  HENT:  Head: Normocephalic  and atraumatic.  Right Ear: External ear normal.  Mouth/Throat: Oropharynx is clear and moist.  Eyes: Conjunctivae are normal. Pupils are equal, round, and reactive to light.  Neck: Normal range of motion. Neck supple.  Cardiovascular: Normal rate, normal heart sounds and intact distal pulses.   Pulmonary/Chest: Effort normal. No respiratory distress. She has wheezes.  Abdominal: Soft. Bowel sounds are normal.  Musculoskeletal: Normal range of motion.  Lymphadenopathy:    She has no cervical adenopathy.  Neurological: She is alert and oriented to person, place, and time. She has normal reflexes.  Skin: Skin is warm and dry.  Psychiatric: She has a normal mood and affect. Her behavior is normal.     Diagnostic Tests:   Impression:   Plan:

## 2011-11-29 ENCOUNTER — Ambulatory Visit (HOSPITAL_COMMUNITY)
Admission: RE | Admit: 2011-11-29 | Discharge: 2011-11-29 | Disposition: A | Discharge status: Home / Self Care | Payer: Medicare Other | Source: Ambulatory Visit | Attending: Thoracic Surgery | Admitting: Thoracic Surgery

## 2011-11-29 ENCOUNTER — Encounter: Payer: Self-pay | Admitting: *Deleted

## 2011-11-29 DIAGNOSIS — I739 Peripheral vascular disease, unspecified: Secondary | ICD-10-CM | POA: Insufficient documentation

## 2011-11-29 DIAGNOSIS — F17200 Nicotine dependence, unspecified, uncomplicated: Secondary | ICD-10-CM | POA: Insufficient documentation

## 2011-11-29 DIAGNOSIS — I672 Cerebral atherosclerosis: Secondary | ICD-10-CM | POA: Insufficient documentation

## 2011-11-29 DIAGNOSIS — J449 Chronic obstructive pulmonary disease, unspecified: Secondary | ICD-10-CM | POA: Insufficient documentation

## 2011-11-29 DIAGNOSIS — R05 Cough: Secondary | ICD-10-CM | POA: Insufficient documentation

## 2011-11-29 DIAGNOSIS — R0609 Other forms of dyspnea: Secondary | ICD-10-CM | POA: Insufficient documentation

## 2011-11-29 DIAGNOSIS — R059 Cough, unspecified: Secondary | ICD-10-CM | POA: Insufficient documentation

## 2011-11-29 DIAGNOSIS — C7931 Secondary malignant neoplasm of brain: Secondary | ICD-10-CM

## 2011-11-29 DIAGNOSIS — Z79899 Other long term (current) drug therapy: Secondary | ICD-10-CM | POA: Insufficient documentation

## 2011-11-29 DIAGNOSIS — R51 Headache: Secondary | ICD-10-CM | POA: Insufficient documentation

## 2011-11-29 DIAGNOSIS — C3490 Malignant neoplasm of unspecified part of unspecified bronchus or lung: Secondary | ICD-10-CM | POA: Insufficient documentation

## 2011-11-29 DIAGNOSIS — G4734 Idiopathic sleep related nonobstructive alveolar hypoventilation: Secondary | ICD-10-CM | POA: Insufficient documentation

## 2011-11-29 DIAGNOSIS — R0989 Other specified symptoms and signs involving the circulatory and respiratory systems: Secondary | ICD-10-CM | POA: Insufficient documentation

## 2011-11-29 DIAGNOSIS — Z8619 Personal history of other infectious and parasitic diseases: Secondary | ICD-10-CM | POA: Insufficient documentation

## 2011-11-29 DIAGNOSIS — I10 Essential (primary) hypertension: Secondary | ICD-10-CM | POA: Insufficient documentation

## 2011-11-29 DIAGNOSIS — D381 Neoplasm of uncertain behavior of trachea, bronchus and lung: Secondary | ICD-10-CM

## 2011-11-29 DIAGNOSIS — R222 Localized swelling, mass and lump, trunk: Secondary | ICD-10-CM

## 2011-11-29 LAB — PULMONARY FUNCTION TEST

## 2011-11-29 MED ORDER — IOHEXOL 300 MG/ML  SOLN
100.0000 mL | Freq: Once | INTRAMUSCULAR | Status: AC | PRN
  Administered 2011-11-29: 100 mL via INTRAVENOUS

## 2011-11-30 ENCOUNTER — Encounter (HOSPITAL_COMMUNITY): Payer: Self-pay

## 2011-12-03 ENCOUNTER — Other Ambulatory Visit (HOSPITAL_COMMUNITY): Payer: Self-pay | Admitting: Physician Assistant

## 2011-12-06 ENCOUNTER — Encounter (HOSPITAL_COMMUNITY): Payer: Self-pay

## 2011-12-06 ENCOUNTER — Ambulatory Visit (HOSPITAL_COMMUNITY)
Admission: RE | Admit: 2011-12-06 | Discharge: 2011-12-06 | Disposition: A | Discharge status: Home / Self Care | Payer: Medicare Other | Source: Ambulatory Visit | Attending: Thoracic Surgery | Admitting: Thoracic Surgery

## 2011-12-06 ENCOUNTER — Ambulatory Visit (HOSPITAL_COMMUNITY)
Admission: RE | Admit: 2011-12-06 | Discharge: 2011-12-06 | Disposition: A | Discharge status: Home / Self Care | Payer: Medicare Other | Source: Ambulatory Visit | Attending: Diagnostic Radiology | Admitting: Diagnostic Radiology

## 2011-12-06 DIAGNOSIS — C3490 Malignant neoplasm of unspecified part of unspecified bronchus or lung: Secondary | ICD-10-CM

## 2011-12-06 DIAGNOSIS — I739 Peripheral vascular disease, unspecified: Secondary | ICD-10-CM | POA: Insufficient documentation

## 2011-12-06 DIAGNOSIS — D381 Neoplasm of uncertain behavior of trachea, bronchus and lung: Secondary | ICD-10-CM | POA: Insufficient documentation

## 2011-12-06 DIAGNOSIS — J4489 Other specified chronic obstructive pulmonary disease: Secondary | ICD-10-CM | POA: Insufficient documentation

## 2011-12-06 DIAGNOSIS — Z79899 Other long term (current) drug therapy: Secondary | ICD-10-CM | POA: Insufficient documentation

## 2011-12-06 DIAGNOSIS — J449 Chronic obstructive pulmonary disease, unspecified: Secondary | ICD-10-CM | POA: Insufficient documentation

## 2011-12-06 DIAGNOSIS — I10 Essential (primary) hypertension: Secondary | ICD-10-CM | POA: Insufficient documentation

## 2011-12-06 DIAGNOSIS — Z7982 Long term (current) use of aspirin: Secondary | ICD-10-CM | POA: Insufficient documentation

## 2011-12-06 HISTORY — DX: Malignant neoplasm of unspecified part of unspecified bronchus or lung: C34.90

## 2011-12-06 LAB — CBC
HCT: 34.7 % — ABNORMAL LOW (ref 36.0–46.0)
MCH: 31.6 pg (ref 26.0–34.0)
MCV: 93.8 fL (ref 78.0–100.0)
RBC: 3.7 MIL/uL — ABNORMAL LOW (ref 3.87–5.11)
WBC: 7.6 10*3/uL (ref 4.0–10.5)

## 2011-12-06 MED ORDER — FENTANYL CITRATE 0.05 MG/ML IJ SOLN
INTRAMUSCULAR | Status: AC | PRN
  Administered 2011-12-06: 100 ug via INTRAVENOUS

## 2011-12-06 MED ORDER — SODIUM CHLORIDE 0.9 % IV SOLN
INTRAVENOUS | Status: DC
  Administered 2011-12-06: 12:00:00 via INTRAVENOUS

## 2011-12-06 MED ORDER — MIDAZOLAM HCL 5 MG/5ML IJ SOLN
INTRAMUSCULAR | Status: AC | PRN
  Administered 2011-12-06: 1 mg via INTRAVENOUS

## 2011-12-06 NOTE — Progress Notes (Signed)
Called IR and updated patient on time of her procedure. She verbalizes understanding. Sister verbalizes understanding.

## 2011-12-06 NOTE — Progress Notes (Signed)
CXR shows no pneumothorax. Patient allowed to eat lunch and drink a soda.

## 2011-12-06 NOTE — ED Notes (Signed)
Patient denies pain and is resting comfortably.  

## 2011-12-06 NOTE — Progress Notes (Signed)
Portable CXR taken.

## 2011-12-06 NOTE — H&P (Signed)
Andrea [name] is an 75 y.o. female.   Chief Complaint: LEFT LUNG MASS HPI: Patient with history of COPD and two hypermetabolic left upper lung nodules presents today for elective CT guided left upper lung mass(larger of two) biopsy.  Past Medical History  Diagnosis Date  . Hypertension   . PVD (peripheral vascular disease)   . COPD (chronic obstructive pulmonary disease)   . History of shingles 1985  . Sleep related hypoxia     USES CPAP  . Tobacco abuse     Past Surgical History  Procedure Date  . Appendectomy     1957  . Elbow surgery 2008    right elbow  . Tonsillectomy     Family History  Problem Relation Age of Onset  . Stroke Mother   . Stroke Father   . Cancer Sister   father with lung cancer; sister and uncle with colon cancer Social History:  reports that she has been smoking Cigarettes.  She has a 40 pack-year smoking history. She has never used smokeless tobacco. She reports that she drinks alcohol. She reports that she does not use illicit drugs.  Allergies: No Known Allergies  Medications Prior to Admission  Medication Sig Dispense Refill  . albuterol (PROVENTIL HFA;VENTOLIN HFA) 108 (90 BASE) MCG/ACT inhaler Inhale 2 puffs into the lungs every 4 (four) hours as needed.        Marland Kitchen albuterol (PROVENTIL) (2.5 MG/3ML) 0.083% nebulizer solution Take 2.5 mg by nebulization every 4 (four) hours as needed.        Marland Kitchen alendronate (FOSAMAX) 70 MG tablet Take 70 mg by mouth every 7 (seven) days. Take with a full glass of water on an empty stomach.       Marland Kitchen aspirin EC 81 MG tablet Take 81 mg by mouth daily.        . cholecalciferol (VITAMIN D) 1000 UNITS tablet Take 2,000 Units by mouth daily.        . cyclobenzaprine (FLEXERIL) 5 MG tablet Take 5 mg by mouth 3 (three) times daily as needed.        . Fluticasone-Salmeterol (ADVAIR) 250-50 MCG/DOSE AEPB Inhale 1 puff into the lungs every 12 (twelve) hours.        Marland Kitchen galantamine (RAZADYNE ER) 8 MG 24 hr capsule Take 8 mg by  mouth daily with breakfast.        . losartan (COZAAR) 50 MG tablet Take 50 mg by mouth daily.        Marland Kitchen lovastatin (MEVACOR) 40 MG tablet Take 40 mg by mouth at bedtime.        . memantine (NAMENDA) 10 MG tablet Take 10 mg by mouth 2 (two) times daily.        . Multiple Vitamin (MULITIVITAMIN WITH MINERALS) TABS Take 1 tablet by mouth daily.        . Omega-3 Fatty Acids (FISH OIL) 1200 MG CAPS Take 1 capsule by mouth 2 (two) times daily.        . roflumilast (DALIRESP) 500 MCG TABS tablet Take 500 mcg by mouth daily.        Marland Kitchen tiotropium (SPIRIVA) 18 MCG inhalation capsule Place 18 mcg into inhaler and inhale daily.        . NON FORMULARY Inhale 2 puffs into the lungs daily. Respimate Inhaler through clinical study.        Medications Prior to Admission  Medication Dose Route Frequency Provider Last Rate Last Dose  . 0.9 %  sodium  chloride infusion   Intravenous Continuous Anselm Pancoast, PA 20 mL/hr at 12/06/11 1134      Results for orders placed during the hospital encounter of 12/06/11 (from the past 48 hour(s))  CBC     Status: Abnormal   Collection Time   12/06/11 11:25 AM      Component Value Range Comment   WBC 7.6  4.0 - 10.5 (K/uL)    RBC 3.70 (*) 3.87 - 5.11 (MIL/uL)    Hemoglobin 11.7 (*) 12.0 - 15.0 (g/dL)    HCT 16.1 (*) 09.6 - 46.0 (%)    MCV 93.8  78.0 - 100.0 (fL)    MCH 31.6  26.0 - 34.0 (pg)    MCHC 33.7  30.0 - 36.0 (g/dL)    RDW 04.5  40.9 - 81.1 (%)    Platelets 222  150 - 400 (K/uL)   PROTIME-INR     Status: Normal   Collection Time   12/06/11 11:25 AM      Component Value Range Comment   Prothrombin Time 13.4  11.6 - 15.2 (seconds)    INR 1.00  0.00 - 1.49     No results found.  Review of Systems  Constitutional: Negative for fever and chills.  Respiratory: Positive for cough and shortness of breath.        Occasional cough, some dyspnea with exertion  Cardiovascular: Negative for chest pain.  Gastrointestinal: Negative for nausea, vomiting and  abdominal pain.  Neurological: Negative for headaches.  Endo/Heme/Allergies: Does not bruise/bleed easily.    Blood pressure 121/66, pulse 90, temperature 97.3 F (36.3 C), temperature source Oral, resp. rate 18, SpO2 95.00%. Physical Exam  Constitutional: She is oriented to person, place, and time. She appears well-developed and well-nourished.  Cardiovascular: Normal rate and regular rhythm.   Respiratory: Effort normal and breath sounds normal.  GI: Soft. Bowel sounds are normal. She exhibits no distension. There is no tenderness.  Musculoskeletal: Normal range of motion. She exhibits no edema.  Neurological: She is alert and oriented to person, place, and time.     Assessment/Plan Patient with significant smoking history/COPD and two hypermetabolic left upper lung nodules; plan is for CT guided biopsy of larger left upper lung nodule. Details/risks of procedure (including, but not limited to, internal bleeding, infection, pneumothorax with chest tube placement) discussed with patient/sister with their apparent understanding and consent.  Maynard David,D KEVIN 12/06/2011, 12:31 PM

## 2011-12-06 NOTE — Procedures (Signed)
CT guided FNAs of left upper lobe nodule.  3 FNAs.  No immediate complication.

## 2011-12-06 NOTE — ED Notes (Signed)
Biopsy is complete. MD reviewing lab collection before pt. Is removed from CT table.

## 2011-12-07 NOTE — H&P (Signed)
Agree with PA note. 

## 2011-12-12 ENCOUNTER — Encounter: Payer: Self-pay | Admitting: Thoracic Surgery

## 2011-12-12 ENCOUNTER — Other Ambulatory Visit: Payer: Self-pay

## 2011-12-12 ENCOUNTER — Ambulatory Visit (INDEPENDENT_AMBULATORY_CARE_PROVIDER_SITE_OTHER): Payer: Medicare Other | Admitting: Thoracic Surgery

## 2011-12-12 ENCOUNTER — Other Ambulatory Visit (HOSPITAL_COMMUNITY): Payer: Medicare Other

## 2011-12-12 VITALS — BP 121/64 | HR 110 | Resp 20 | Ht 59.5 in | Wt 135.0 lb

## 2011-12-12 DIAGNOSIS — C3410 Malignant neoplasm of upper lobe, unspecified bronchus or lung: Secondary | ICD-10-CM

## 2011-12-12 DIAGNOSIS — C3490 Malignant neoplasm of unspecified part of unspecified bronchus or lung: Secondary | ICD-10-CM

## 2011-12-12 DIAGNOSIS — Z9889 Other specified postprocedural states: Secondary | ICD-10-CM

## 2011-12-12 NOTE — Progress Notes (Signed)
HPI patient returns for followup of her needle biopsy. The needle biopsy was positive for non-small cell lung cancer. Her pulmonary function tests were surprisingly good with a FVC of 1.8 an FEV1 of 1.2 and a diffusion capacity of 54%. All these are satisfactory for a left upper lobectomy. I've recommended a left upper lobectomy and the patient agrees to surgery. Risk of the surgery were explained to the patient. There is a good chance of success.   Current Outpatient Prescriptions  Medication Sig Dispense Refill  . albuterol (PROVENTIL HFA;VENTOLIN HFA) 108 (90 BASE) MCG/ACT inhaler Inhale 2 puffs into the lungs every 4 (four) hours as needed.        . albuterol (PROVENTIL) (2.5 MG/3ML) 0.083% nebulizer solution Take 2.5 mg by nebulization every 4 (four) hours as needed.        . alendronate (FOSAMAX) 70 MG tablet Take 70 mg by mouth every 7 (seven) days. Take with a full glass of water on an empty stomach.       . aspirin EC 81 MG tablet Take 81 mg by mouth daily.        . cholecalciferol (VITAMIN D) 1000 UNITS tablet Take 2,000 Units by mouth daily.        . cyclobenzaprine (FLEXERIL) 5 MG tablet Take 5 mg by mouth 3 (three) times daily as needed.        . Fluticasone-Salmeterol (ADVAIR) 250-50 MCG/DOSE AEPB Inhale 1 puff into the lungs every 12 (twelve) hours.        . galantamine (RAZADYNE ER) 8 MG 24 hr capsule Take 8 mg by mouth daily with breakfast.        . losartan (COZAAR) 50 MG tablet Take 50 mg by mouth daily.        . lovastatin (MEVACOR) 40 MG tablet Take 40 mg by mouth at bedtime.        . memantine (NAMENDA) 10 MG tablet Take 10 mg by mouth 2 (two) times daily.        . Multiple Vitamin (MULITIVITAMIN WITH MINERALS) TABS Take 1 tablet by mouth daily.        . NON FORMULARY Inhale 2 puffs into the lungs daily. Respimate Inhaler through clinical study.       . Omega-3 Fatty Acids (FISH OIL) 1200 MG CAPS Take 1 capsule by mouth 2 (two) times daily.        . roflumilast (DALIRESP) 500  MCG TABS tablet Take 500 mcg by mouth daily.        . tiotropium (SPIRIVA) 18 MCG inhalation capsule Place 18 mcg into inhaler and inhale daily.           Review of Systems: Unchanged   Physical Exam lungs are clear aspiration percussion heart regular sinus rhythm   Diagnostic Tests: Needle biopsy rule out non-small cell lung cancer left upper lobe   Impression: Stage IIa non-small cell lung cancer left upper lobe 2 lesions in left upper lobe   Plan: Left upper lobectomy with node dissection      

## 2011-12-14 ENCOUNTER — Encounter (HOSPITAL_COMMUNITY): Payer: Self-pay | Admitting: *Deleted

## 2011-12-14 NOTE — Progress Notes (Signed)
Pt is HOH.  Get pts attention, speak clearly in right ear- per pt.

## 2011-12-16 MED ORDER — DEXTROSE 5 % IV SOLN
1.5000 g | INTRAVENOUS | Status: AC
  Administered 2011-12-17: 1.5 g via INTRAVENOUS
  Filled 2011-12-16: qty 1.5

## 2011-12-17 ENCOUNTER — Other Ambulatory Visit: Payer: Self-pay

## 2011-12-17 ENCOUNTER — Encounter (HOSPITAL_COMMUNITY): Payer: Self-pay | Admitting: *Deleted

## 2011-12-17 ENCOUNTER — Encounter (HOSPITAL_COMMUNITY): Payer: Self-pay | Admitting: Anesthesiology

## 2011-12-17 ENCOUNTER — Encounter (HOSPITAL_COMMUNITY)
Admission: RE | Disposition: A | Discharge status: Home / Self Care | Payer: Self-pay | Source: Ambulatory Visit | Attending: Thoracic Surgery

## 2011-12-17 ENCOUNTER — Ambulatory Visit (HOSPITAL_COMMUNITY): Payer: Medicare Other

## 2011-12-17 ENCOUNTER — Inpatient Hospital Stay (HOSPITAL_COMMUNITY)
Admission: RE | Admit: 2011-12-17 | Discharge: 2011-12-22 | DRG: 165 | Disposition: A | Discharge status: Home / Self Care | Payer: Medicare Other | Source: Ambulatory Visit | Attending: Thoracic Surgery | Admitting: Thoracic Surgery

## 2011-12-17 ENCOUNTER — Other Ambulatory Visit: Payer: Self-pay | Admitting: Thoracic Surgery

## 2011-12-17 ENCOUNTER — Ambulatory Visit (HOSPITAL_COMMUNITY): Payer: Medicare Other | Admitting: Anesthesiology

## 2011-12-17 DIAGNOSIS — J4489 Other specified chronic obstructive pulmonary disease: Secondary | ICD-10-CM | POA: Diagnosis present

## 2011-12-17 DIAGNOSIS — Z7982 Long term (current) use of aspirin: Secondary | ICD-10-CM

## 2011-12-17 DIAGNOSIS — F0390 Unspecified dementia without behavioral disturbance: Secondary | ICD-10-CM | POA: Diagnosis present

## 2011-12-17 DIAGNOSIS — Z79899 Other long term (current) drug therapy: Secondary | ICD-10-CM

## 2011-12-17 DIAGNOSIS — Z87891 Personal history of nicotine dependence: Secondary | ICD-10-CM

## 2011-12-17 DIAGNOSIS — G4734 Idiopathic sleep related nonobstructive alveolar hypoventilation: Secondary | ICD-10-CM | POA: Diagnosis present

## 2011-12-17 DIAGNOSIS — I10 Essential (primary) hypertension: Secondary | ICD-10-CM | POA: Diagnosis present

## 2011-12-17 DIAGNOSIS — C3410 Malignant neoplasm of upper lobe, unspecified bronchus or lung: Secondary | ICD-10-CM

## 2011-12-17 DIAGNOSIS — J449 Chronic obstructive pulmonary disease, unspecified: Secondary | ICD-10-CM | POA: Diagnosis present

## 2011-12-17 DIAGNOSIS — I739 Peripheral vascular disease, unspecified: Secondary | ICD-10-CM | POA: Diagnosis present

## 2011-12-17 HISTORY — DX: Adverse effect of unspecified anesthetic, initial encounter: T41.45XA

## 2011-12-17 HISTORY — DX: Other complications of anesthesia, initial encounter: T88.59XA

## 2011-12-17 HISTORY — DX: Idiopathic sleep related nonobstructive alveolar hypoventilation: G47.34

## 2011-12-17 HISTORY — DX: Malignant (primary) neoplasm, unspecified: C80.1

## 2011-12-17 HISTORY — DX: Unspecified osteoarthritis, unspecified site: M19.90

## 2011-12-17 LAB — URINALYSIS, ROUTINE W REFLEX MICROSCOPIC
Bilirubin Urine: NEGATIVE
Hgb urine dipstick: NEGATIVE
Nitrite: NEGATIVE
Specific Gravity, Urine: 1.015 (ref 1.005–1.030)
pH: 5.5 (ref 5.0–8.0)

## 2011-12-17 LAB — TYPE AND SCREEN

## 2011-12-17 LAB — CBC
HCT: 35.6 % — ABNORMAL LOW (ref 36.0–46.0)
MCV: 94.4 fL (ref 78.0–100.0)
RBC: 3.77 MIL/uL — ABNORMAL LOW (ref 3.87–5.11)
RDW: 14.3 % (ref 11.5–15.5)
WBC: 7.6 10*3/uL (ref 4.0–10.5)

## 2011-12-17 LAB — COMPREHENSIVE METABOLIC PANEL
BUN: 10 mg/dL (ref 6–23)
CO2: 25 mEq/L (ref 19–32)
Chloride: 106 mEq/L (ref 96–112)
Creatinine, Ser: 0.54 mg/dL (ref 0.50–1.10)
GFR calc Af Amer: >90 mL/min (ref >90)
GFR calc non Af Amer: >90 mL/min (ref >90)
Glucose, Bld: 94 mg/dL (ref 70–99)
Total Bilirubin: 0.5 mg/dL (ref 0.3–1.2)

## 2011-12-17 LAB — BLOOD GAS, ARTERIAL
Acid-Base Excess: 1.4 mmol/L (ref 0.0–2.0)
TCO2: 27 mmol/L (ref 0–100)
pCO2 arterial: 42.9 mmHg (ref 35.0–45.0)

## 2011-12-17 LAB — PROTIME-INR
INR: 1.01 (ref 0.00–1.49)
Prothrombin Time: 13.5 seconds (ref 11.6–15.2)

## 2011-12-17 LAB — GLUCOSE, CAPILLARY: Glucose-Capillary: 120 mg/dL — ABNORMAL HIGH (ref 70–99)

## 2011-12-17 LAB — ABO/RH: ABO/RH(D): A POS

## 2011-12-17 LAB — SURGICAL PCR SCREEN
MRSA, PCR: NEGATIVE
Staphylococcus aureus: NEGATIVE

## 2011-12-17 SURGERY — VIDEO ASSISTED THORACOSCOPY (VATS)/ LOBECTOMY
Anesthesia: General | Site: Chest | Laterality: Left | Wound class: Clean Contaminated

## 2011-12-17 MED ORDER — ONDANSETRON HCL 4 MG/2ML IJ SOLN: 4.0000 mg | Freq: Four times a day (QID) | INTRAMUSCULAR | Status: DC | PRN

## 2011-12-17 MED ORDER — BUPIVACAINE-EPINEPHRINE 0.25% -1:200000 IJ SOLN
INTRAMUSCULAR | Status: DC | PRN
  Administered 2011-12-17: 30 mL

## 2011-12-17 MED ORDER — OXYCODONE-ACETAMINOPHEN 5-325 MG PO TABS: 1.0000 | ORAL_TABLET | ORAL | Status: DC | PRN

## 2011-12-17 MED ORDER — SODIUM CHLORIDE 0.9 % IJ SOLN: 9.0000 mL | INTRAMUSCULAR | Status: DC | PRN

## 2011-12-17 MED ORDER — ONDANSETRON HCL 4 MG/2ML IJ SOLN
INTRAMUSCULAR | Status: DC | PRN
  Administered 2011-12-17: 4 mg via INTRAVENOUS

## 2011-12-17 MED ORDER — FLUTICASONE-SALMETEROL 250-50 MCG/DOSE IN AEPB
1.0000 | INHALATION_SPRAY | Freq: Two times a day (BID) | RESPIRATORY_TRACT | Status: DC
  Administered 2011-12-18 – 2011-12-22 (×9): 1 via RESPIRATORY_TRACT
  Filled 2011-12-17: qty 14

## 2011-12-17 MED ORDER — NEOSTIGMINE METHYLSULFATE 1 MG/ML IJ SOLN
INTRAMUSCULAR | Status: DC | PRN
  Administered 2011-12-17: 4 mg via INTRAVENOUS

## 2011-12-17 MED ORDER — POTASSIUM CHLORIDE 10 MEQ/50ML IV SOLN
10.0000 meq | Freq: Every day | INTRAVENOUS | Status: DC | PRN
  Administered 2011-12-19: 10 meq via INTRAVENOUS
  Filled 2011-12-17: qty 50

## 2011-12-17 MED ORDER — MIDAZOLAM HCL 5 MG/5ML IJ SOLN
INTRAMUSCULAR | Status: DC | PRN
  Administered 2011-12-17 (×2): 1 mg via INTRAVENOUS

## 2011-12-17 MED ORDER — MEMANTINE HCL 10 MG PO TABS
10.0000 mg | ORAL_TABLET | Freq: Two times a day (BID) | ORAL | Status: DC
  Administered 2011-12-18 – 2011-12-22 (×10): 10 mg via ORAL
  Filled 2011-12-17 (×11): qty 1

## 2011-12-17 MED ORDER — FENTANYL CITRATE 0.05 MG/ML IJ SOLN
INTRAMUSCULAR | Status: DC | PRN
  Administered 2011-12-17: 100 ug via INTRAVENOUS
  Administered 2011-12-17 (×2): 50 ug via INTRAVENOUS

## 2011-12-17 MED ORDER — BISACODYL 5 MG PO TBEC
10.0000 mg | DELAYED_RELEASE_TABLET | Freq: Every day | ORAL | Status: DC
  Administered 2011-12-18 – 2011-12-21 (×4): 10 mg via ORAL
  Filled 2011-12-17 (×4): qty 2

## 2011-12-17 MED ORDER — SENNOSIDES-DOCUSATE SODIUM 8.6-50 MG PO TABS
1.0000 | ORAL_TABLET | Freq: Every evening | ORAL | Status: DC | PRN
  Filled 2011-12-17: qty 1

## 2011-12-17 MED ORDER — DEXTROSE 5 % IV SOLN
1.5000 g | Freq: Two times a day (BID) | INTRAVENOUS | Status: AC
  Administered 2011-12-17 – 2011-12-18 (×2): 1.5 g via INTRAVENOUS
  Filled 2011-12-17 (×3): qty 1.5

## 2011-12-17 MED ORDER — BUPIVACAINE 0.25 % ON-Q PUMP SINGLE CATH 400 ML
400.0000 mL | INJECTION | Status: DC
  Filled 2011-12-17: qty 400

## 2011-12-17 MED ORDER — SIMVASTATIN 40 MG PO TABS
40.0000 mg | ORAL_TABLET | Freq: Every day | ORAL | Status: DC
  Administered 2011-12-18 – 2011-12-21 (×3): 40 mg via ORAL
  Filled 2011-12-17 (×6): qty 1

## 2011-12-17 MED ORDER — ASPIRIN EC 81 MG PO TBEC
81.0000 mg | DELAYED_RELEASE_TABLET | Freq: Every day | ORAL | Status: DC
  Administered 2011-12-18 – 2011-12-22 (×5): 81 mg via ORAL
  Filled 2011-12-17 (×5): qty 1

## 2011-12-17 MED ORDER — BUPIVACAINE 0.5 % ON-Q PUMP SINGLE CATH 400 ML
400.0000 mL | INJECTION | Status: AC
  Filled 2011-12-17: qty 400

## 2011-12-17 MED ORDER — VECURONIUM BROMIDE 10 MG IV SOLR
INTRAVENOUS | Status: DC | PRN
  Administered 2011-12-17: 1 mg via INTRAVENOUS

## 2011-12-17 MED ORDER — BUPIVACAINE ON-Q PAIN PUMP (FOR ORDER SET NO CHG)
INJECTION | Status: DC
  Filled 2011-12-17: qty 1

## 2011-12-17 MED ORDER — MEPERIDINE HCL 25 MG/ML IJ SOLN: 6.2500 mg | INTRAMUSCULAR | Status: DC | PRN

## 2011-12-17 MED ORDER — HETASTARCH-ELECTROLYTES 6 % IV SOLN
INTRAVENOUS | Status: DC | PRN
  Administered 2011-12-17: 09:00:00 via INTRAVENOUS

## 2011-12-17 MED ORDER — PROPOFOL 10 MG/ML IV EMUL
INTRAVENOUS | Status: DC | PRN
  Administered 2011-12-17: 90 mg via INTRAVENOUS

## 2011-12-17 MED ORDER — 0.9 % SODIUM CHLORIDE (POUR BTL) OPTIME
TOPICAL | Status: DC | PRN
  Administered 2011-12-17: 2000 mL

## 2011-12-17 MED ORDER — ACETAMINOPHEN 10 MG/ML IV SOLN
1000.0000 mg | Freq: Four times a day (QID) | INTRAVENOUS | Status: AC
  Administered 2011-12-17 – 2011-12-18 (×4): 1000 mg via INTRAVENOUS
  Filled 2011-12-17 (×6): qty 100

## 2011-12-17 MED ORDER — ALBUTEROL SULFATE HFA 108 (90 BASE) MCG/ACT IN AERS
2.0000 | INHALATION_SPRAY | RESPIRATORY_TRACT | Status: DC | PRN
  Administered 2011-12-19: 2 via RESPIRATORY_TRACT
  Filled 2011-12-17: qty 6.7

## 2011-12-17 MED ORDER — ZOLPIDEM TARTRATE 5 MG PO TABS: 5.0000 mg | ORAL_TABLET | Freq: Every evening | ORAL | Status: DC | PRN

## 2011-12-17 MED ORDER — OXYCODONE-ACETAMINOPHEN 5-325 MG PO TABS
1.0000 | ORAL_TABLET | ORAL | Status: DC | PRN
  Administered 2011-12-19 – 2011-12-20 (×2): 1 via ORAL
  Filled 2011-12-17 (×2): qty 1

## 2011-12-17 MED ORDER — CYCLOBENZAPRINE HCL 10 MG PO TABS: 5.0000 mg | ORAL_TABLET | Freq: Three times a day (TID) | ORAL | Status: DC | PRN

## 2011-12-17 MED ORDER — LACTATED RINGERS IV SOLN
INTRAVENOUS | Status: DC | PRN
  Administered 2011-12-17: 08:00:00 via INTRAVENOUS

## 2011-12-17 MED ORDER — GALANTAMINE HYDROBROMIDE ER 8 MG PO CP24: 8.0000 mg | ORAL_CAPSULE | Freq: Every day | ORAL | Status: DC

## 2011-12-17 MED ORDER — TRAMADOL HCL 50 MG PO TABS
50.0000 mg | ORAL_TABLET | Freq: Four times a day (QID) | ORAL | Status: DC | PRN
  Administered 2011-12-18 – 2011-12-19 (×2): 100 mg via ORAL
  Filled 2011-12-17 (×2): qty 2

## 2011-12-17 MED ORDER — PROMETHAZINE HCL 25 MG/ML IJ SOLN: 6.2500 mg | INTRAMUSCULAR | Status: DC | PRN

## 2011-12-17 MED ORDER — LACTATED RINGERS IV SOLN
INTRAVENOUS | Status: DC | PRN
  Administered 2011-12-17 (×2): via INTRAVENOUS

## 2011-12-17 MED ORDER — DIPHENHYDRAMINE HCL 50 MG/ML IJ SOLN
12.5000 mg | Freq: Four times a day (QID) | INTRAMUSCULAR | Status: DC | PRN
  Filled 2011-12-17: qty 0.25

## 2011-12-17 MED ORDER — OXYCODONE HCL 5 MG PO TABS: 5.0000 mg | ORAL_TABLET | ORAL | Status: AC | PRN

## 2011-12-17 MED ORDER — ROCURONIUM BROMIDE 100 MG/10ML IV SOLN
INTRAVENOUS | Status: DC | PRN
  Administered 2011-12-17: 50 mg via INTRAVENOUS

## 2011-12-17 MED ORDER — HEMOSTATIC AGENTS (NO CHARGE) OPTIME
TOPICAL | Status: DC | PRN
  Administered 2011-12-17: 1 via TOPICAL

## 2011-12-17 MED ORDER — TIOTROPIUM BROMIDE MONOHYDRATE 18 MCG IN CAPS
18.0000 ug | ORAL_CAPSULE | Freq: Every day | RESPIRATORY_TRACT | Status: DC
  Administered 2011-12-18 – 2011-12-22 (×5): 18 ug via RESPIRATORY_TRACT
  Filled 2011-12-17: qty 5

## 2011-12-17 MED ORDER — ROFLUMILAST 500 MCG PO TABS
500.0000 ug | ORAL_TABLET | Freq: Every day | ORAL | Status: DC
  Administered 2011-12-18 – 2011-12-22 (×5): 500 ug via ORAL
  Filled 2011-12-17 (×5): qty 1

## 2011-12-17 MED ORDER — PHENYLEPHRINE HCL 10 MG/ML IJ SOLN
10.0000 mg | INTRAVENOUS | Status: DC | PRN
  Administered 2011-12-17: 5 ug/min via INTRAVENOUS

## 2011-12-17 MED ORDER — ACETAMINOPHEN 10 MG/ML IV SOLN
1000.0000 mg | INTRAVENOUS | Status: AC
  Filled 2011-12-17 (×2): qty 100

## 2011-12-17 MED ORDER — GALANTAMINE HYDROBROMIDE ER 8 MG PO CP24
8.0000 mg | ORAL_CAPSULE | Freq: Every day | ORAL | Status: DC
  Administered 2011-12-18: 8 mg via ORAL
  Filled 2011-12-17 (×3): qty 1

## 2011-12-17 MED ORDER — POTASSIUM CHLORIDE IN NACL 20-0.9 MEQ/L-% IV SOLN
INTRAVENOUS | Status: DC
  Administered 2011-12-17 – 2011-12-18 (×2): via INTRAVENOUS
  Administered 2011-12-19: 75 mL/h via INTRAVENOUS
  Filled 2011-12-17 (×9): qty 1000

## 2011-12-17 MED ORDER — DIPHENHYDRAMINE HCL 12.5 MG/5ML PO ELIX
12.5000 mg | ORAL_SOLUTION | Freq: Four times a day (QID) | ORAL | Status: DC | PRN
  Filled 2011-12-17: qty 5

## 2011-12-17 MED ORDER — HYDROMORPHONE HCL PF 1 MG/ML IJ SOLN
0.2500 mg | INTRAMUSCULAR | Status: DC | PRN
  Administered 2011-12-17 (×2): 0.5 mg via INTRAVENOUS

## 2011-12-17 MED ORDER — ONDANSETRON HCL 4 MG/2ML IJ SOLN
4.0000 mg | Freq: Four times a day (QID) | INTRAMUSCULAR | Status: DC | PRN
  Filled 2011-12-17: qty 2

## 2011-12-17 MED ORDER — NALOXONE HCL 0.4 MG/ML IJ SOLN
0.4000 mg | INTRAMUSCULAR | Status: DC | PRN
  Filled 2011-12-17: qty 1

## 2011-12-17 MED ORDER — GLYCOPYRROLATE 0.2 MG/ML IJ SOLN
INTRAMUSCULAR | Status: DC | PRN
  Administered 2011-12-17: .8 mg via INTRAVENOUS

## 2011-12-17 MED ORDER — FENTANYL 10 MCG/ML IV SOLN
INTRAVENOUS | Status: DC
  Administered 2011-12-17: 10 ug via INTRAVENOUS
  Administered 2011-12-17: 30 ug via INTRAVENOUS
  Administered 2011-12-18: 70 ug via INTRAVENOUS
  Administered 2011-12-18: 40 ug via INTRAVENOUS
  Administered 2011-12-18: 25.99 ug via INTRAVENOUS
  Administered 2011-12-18: 22 ug via INTRAVENOUS
  Administered 2011-12-18: 20 ug via INTRAVENOUS
  Administered 2011-12-18: 80 ug via INTRAVENOUS
  Administered 2011-12-19: 30 ug via INTRAVENOUS
  Administered 2011-12-19: 20 ug via INTRAVENOUS
  Administered 2011-12-19: 70 ug via INTRAVENOUS
  Administered 2011-12-19: 20 ug via INTRAVENOUS
  Filled 2011-12-17: qty 50

## 2011-12-17 MED ORDER — INSULIN ASPART 100 UNIT/ML ~~LOC~~ SOLN
0.0000 [IU] | Freq: Three times a day (TID) | SUBCUTANEOUS | Status: DC
  Administered 2011-12-18 – 2011-12-19 (×5): 2 [IU] via SUBCUTANEOUS
  Filled 2011-12-17: qty 3

## 2011-12-17 MED ORDER — MUPIROCIN 2 % EX OINT
TOPICAL_OINTMENT | CUTANEOUS | Status: AC
  Filled 2011-12-17: qty 22

## 2011-12-17 SURGICAL SUPPLY — 84 items
APPLICATOR TIP EXT COSEAL (VASCULAR PRODUCTS) IMPLANT
APPLIER CLIP ROT 10 11.4 M/L (STAPLE) ×2
BENZOIN TINCTURE PRP APPL 2/3 (GAUZE/BANDAGES/DRESSINGS) ×2 IMPLANT
CANISTER SUCTION 2500CC (MISCELLANEOUS) ×2 IMPLANT
CATH HYDRAGLIDE XL THORACIC (CATHETERS) IMPLANT
CATH KIT ON Q 5IN SLV (PAIN MANAGEMENT) ×2 IMPLANT
CATH THORACIC 28FR (CATHETERS) ×4 IMPLANT
CATH THORACIC 28FR RT ANG (CATHETERS) IMPLANT
CATH THORACIC 36FR (CATHETERS) IMPLANT
CATH THORACIC 36FR RT ANG (CATHETERS) IMPLANT
CATH URET ROBINSON 16FR STRL (CATHETERS) IMPLANT
CLIP APPLIE ROT 10 11.4 M/L (STAPLE) ×1 IMPLANT
CLIP TI MEDIUM 6 (CLIP) ×4 IMPLANT
CLOTH BEACON ORANGE TIMEOUT ST (SAFETY) ×2 IMPLANT
CONN Y 3/8X3/8X3/8  BEN (MISCELLANEOUS)
CONN Y 3/8X3/8X3/8 BEN (MISCELLANEOUS) IMPLANT
CONT SPEC 4OZ CLIKSEAL STRL BL (MISCELLANEOUS) ×4 IMPLANT
COVER SURGICAL LIGHT HANDLE (MISCELLANEOUS) ×4 IMPLANT
DECANTER SPIKE VIAL GLASS SM (MISCELLANEOUS) ×2 IMPLANT
DERMABOND ADVANCED (GAUZE/BANDAGES/DRESSINGS) ×1
DERMABOND ADVANCED .7 DNX12 (GAUZE/BANDAGES/DRESSINGS) ×1 IMPLANT
DRAPE CAMERA CLOSED 9X96 (DRAPES) ×2 IMPLANT
DRAPE LAPAROSCOPIC ABDOMINAL (DRAPES) ×2 IMPLANT
DRAPE WARM FLUID 44X44 (DRAPE) ×2 IMPLANT
DRILL BIT 7/64X5 (BIT) ×2 IMPLANT
DRSG TEGADERM 4X10 (GAUZE/BANDAGES/DRESSINGS) ×2 IMPLANT
ELECT REM PT RETURN 9FT ADLT (ELECTROSURGICAL) ×2
ELECTRODE REM PT RTRN 9FT ADLT (ELECTROSURGICAL) ×1 IMPLANT
GLOVE BIOGEL PI IND STRL 6.5 (GLOVE) ×2 IMPLANT
GLOVE BIOGEL PI IND STRL 7.0 (GLOVE) ×2 IMPLANT
GLOVE BIOGEL PI IND STRL 7.5 (GLOVE) ×1 IMPLANT
GLOVE BIOGEL PI INDICATOR 6.5 (GLOVE) ×2
GLOVE BIOGEL PI INDICATOR 7.0 (GLOVE) ×2
GLOVE BIOGEL PI INDICATOR 7.5 (GLOVE) ×1
GLOVE ECLIPSE 7.0 STRL STRAW (GLOVE) ×2 IMPLANT
GLOVE SS BIOGEL STRL SZ 6.5 (GLOVE) ×1 IMPLANT
GLOVE SUPERSENSE BIOGEL SZ 6.5 (GLOVE) ×1
GLOVE SURG SIGNA 7.5 PF LTX (GLOVE) ×4 IMPLANT
GOWN BRE IMP PREV XXLGXLNG (GOWN DISPOSABLE) ×2 IMPLANT
GOWN STRL NON-REIN LRG LVL3 (GOWN DISPOSABLE) ×4 IMPLANT
HANDLE STAPLE ENDO GIA SHORT (STAPLE) ×1
HEMOSTAT SURGICEL 2X14 (HEMOSTASIS) ×2 IMPLANT
KIT BASIN OR (CUSTOM PROCEDURE TRAY) ×2 IMPLANT
KIT ROOM TURNOVER OR (KITS) ×2 IMPLANT
LIGASURE 5MM LAPAROSCOPIC (INSTRUMENTS) IMPLANT
LOOP VESSEL MAXI BLUE (MISCELLANEOUS) ×2 IMPLANT
NS IRRIG 1000ML POUR BTL (IV SOLUTION) ×4 IMPLANT
PACK CHEST (CUSTOM PROCEDURE TRAY) ×2 IMPLANT
PAD ARMBOARD 7.5X6 YLW CONV (MISCELLANEOUS) ×4 IMPLANT
RELOAD EGIA TRIS TAN 45 CVD (STAPLE) ×4 IMPLANT
RELOAD ENDO GIA 30 3.5 (STAPLE) ×2 IMPLANT
RELOAD GIA 30 2.0 ROTI (ENDOMECHANICALS) ×2 IMPLANT
SEALANT PROGEL (MISCELLANEOUS) ×2 IMPLANT
SEALANT SURG COSEAL 4ML (VASCULAR PRODUCTS) IMPLANT
SEALANT SURG COSEAL 8ML (VASCULAR PRODUCTS) IMPLANT
SOLUTION ANTI FOG 6CC (MISCELLANEOUS) ×2 IMPLANT
SPONGE GAUZE 4X4 12PLY (GAUZE/BANDAGES/DRESSINGS) ×2 IMPLANT
STAPLER ENDO GIA 12MM SHORT (STAPLE) ×1 IMPLANT
STAPLER TA30 4.8 NON-ABS (STAPLE) ×2 IMPLANT
STRIP PERI DRY VERITAS 45 (STAPLE) IMPLANT
STRIP PERI DRY VERITAS 60 (STAPLE) IMPLANT
SUT PROLENE 3 0 SH DA (SUTURE) IMPLANT
SUT PROLENE 4 0 RB 1 (SUTURE)
SUT PROLENE 4-0 RB1 .5 CRCL 36 (SUTURE) IMPLANT
SUT SILK 0 FSL (SUTURE) ×8 IMPLANT
SUT SILK 2 0SH CR/8 30 (SUTURE) ×2 IMPLANT
SUT VIC AB 1 CTX 18 (SUTURE) ×2 IMPLANT
SUT VIC AB 1 CTX 36 (SUTURE)
SUT VIC AB 1 CTX36XBRD ANBCTR (SUTURE) IMPLANT
SUT VIC AB 2-0 CTX 36 (SUTURE) ×2 IMPLANT
SUT VIC AB 3-0 MH 27 (SUTURE) ×2 IMPLANT
SUT VIC AB 3-0 X1 27 (SUTURE) ×2 IMPLANT
SUT VICRYL 2 TP 1 (SUTURE) ×2 IMPLANT
SYSTEM SAHARA CHEST DRAIN RE-I (WOUND CARE) ×2 IMPLANT
TAPE CLOTH 4X10 WHT NS (GAUZE/BANDAGES/DRESSINGS) ×2 IMPLANT
TAPE CLOTH SURG 6X10 WHT LF (GAUZE/BANDAGES/DRESSINGS) ×2 IMPLANT
TAPE TRANSPARENT 1/2IN (GAUZE/BANDAGES/DRESSINGS) ×2 IMPLANT
TAPE UMBILICAL COTTON 1/8X30 (MISCELLANEOUS) ×2 IMPLANT
TIP APPLICATOR SPRAY EXTEND 16 (VASCULAR PRODUCTS) ×2 IMPLANT
TOWEL OR 17X24 6PK STRL BLUE (TOWEL DISPOSABLE) ×4 IMPLANT
TOWEL OR 17X26 10 PK STRL BLUE (TOWEL DISPOSABLE) ×4 IMPLANT
TRAY FOLEY CATH 14FRSI W/METER (CATHETERS) ×2 IMPLANT
TUNNELER SHEATH ON-Q 11GX8 (MISCELLANEOUS) ×2 IMPLANT
WATER STERILE IRR 1000ML POUR (IV SOLUTION) ×4 IMPLANT

## 2011-12-17 NOTE — Interval H&P Note (Signed)
History and Physical Interval Note:  12/17/2011 8:10 AM  Andrea [name]  has presented today for surgery, with the diagnosis of Lung Cancer  The various methods of treatment have been discussed with the patient and family. After consideration of risks, benefits and other options for treatment, the patient has consented to  Procedure(s): VIDEO ASSISTED THORACOSCOPY (VATS)/ LOBECTOMY as a surgical intervention .  The patients' history has been reviewed, patient examined, no change in status, stable for surgery.  I have reviewed the patients' chart and labs.  Questions were answered to the patient's satisfaction.     Cameron Proud

## 2011-12-17 NOTE — Progress Notes (Signed)
Report to Delorise Shiner, Charity fundraiser. Primary caregiver.

## 2011-12-17 NOTE — Anesthesia Postprocedure Evaluation (Signed)
  Anesthesia Post-op Note  Patient: Andrea [name]  Procedure(s) Performed:  VIDEO ASSISTED THORACOSCOPY (VATS)/ LOBECTOMY - Left video assisted thoracoscopy thoracotomy, left upper lobectomy with node dissection  Patient Location: PACU  Anesthesia Type: General  Level of Consciousness: awake  Airway and Oxygen Therapy: Patient Spontanous Breathing and Patient connected to face mask oxygen  Post-op Pain: mild  Post-op Assessment: Post-op Vital signs reviewed, Patient's Cardiovascular Status Stable, Respiratory Function Stable and Patent Airway  Post-op Vital Signs: Reviewed and stable  Complications: No apparent anesthesia complications

## 2011-12-17 NOTE — Progress Notes (Signed)
CBC, CMP sent to lab

## 2011-12-17 NOTE — Brief Op Note (Signed)
12/17/2011  10:49 AM  PATIENT:  Andrea [name]  76 y.o. female  PRE-OPERATIVE DIAGNOSIS:  Lung Cancer  POST-OPERATIVE DIAGNOSIS:   Non small cell carcinoma  PROCEDURE:  Procedure(s): VIDEO ASSISTED THORACOSCOPY (VATS)/ LOBECTOMY  SURGEON:  Surgeon(s): D Karle Plumber, MD  PHYSICIAN ASSISTANT:   ASSISTANTS: Gerald Dexter RNFA   ANESTHESIA:   general  EBL:  Total I/O In: 2100 [I.V.:1600; IV Piggyback:500] Out: 200 [Urine:200]  BLOOD ADMINISTERED:none  DRAINS: left Chest Tube(s) in the pleura   LOCAL MEDICATIONS USED:  MARCAINE 30CC  SPECIMEN:  Excision  DISPOSITION OF SPECIMEN:  PATHOLOGY  COUNTS:  YES  TOURNIQUET:  * No tourniquets in log *  DICTATION: .Other Dictation: Dictation Number [id number]  PLAN OF CARE: Admit to inpatient   PATIENT DISPOSITION:  PACU - hemodynamically stable.   Delay start of Pharmacological VTE agent (>24hrs) due to surgical blood loss or risk of bleeding:  YES

## 2011-12-17 NOTE — H&P (View-Only) (Signed)
HPI patient returns for followup of her needle biopsy. The needle biopsy was positive for non-small cell lung cancer. Her pulmonary function tests were surprisingly good with a FVC of 1.8 an FEV1 of 1.2 and a diffusion capacity of 54%. All these are satisfactory for a left upper lobectomy. I've recommended a left upper lobectomy and the patient agrees to surgery. Risk of the surgery were explained to the patient. There is a good chance of success.   Current Outpatient Prescriptions  Medication Sig Dispense Refill  . albuterol (PROVENTIL HFA;VENTOLIN HFA) 108 (90 BASE) MCG/ACT inhaler Inhale 2 puffs into the lungs every 4 (four) hours as needed.        Marland Kitchen albuterol (PROVENTIL) (2.5 MG/3ML) 0.083% nebulizer solution Take 2.5 mg by nebulization every 4 (four) hours as needed.        Marland Kitchen alendronate (FOSAMAX) 70 MG tablet Take 70 mg by mouth every 7 (seven) days. Take with a full glass of water on an empty stomach.       Marland Kitchen aspirin EC 81 MG tablet Take 81 mg by mouth daily.        . cholecalciferol (VITAMIN D) 1000 UNITS tablet Take 2,000 Units by mouth daily.        . cyclobenzaprine (FLEXERIL) 5 MG tablet Take 5 mg by mouth 3 (three) times daily as needed.        . Fluticasone-Salmeterol (ADVAIR) 250-50 MCG/DOSE AEPB Inhale 1 puff into the lungs every 12 (twelve) hours.        Marland Kitchen galantamine (RAZADYNE ER) 8 MG 24 hr capsule Take 8 mg by mouth daily with breakfast.        . losartan (COZAAR) 50 MG tablet Take 50 mg by mouth daily.        Marland Kitchen lovastatin (MEVACOR) 40 MG tablet Take 40 mg by mouth at bedtime.        . memantine (NAMENDA) 10 MG tablet Take 10 mg by mouth 2 (two) times daily.        . Multiple Vitamin (MULITIVITAMIN WITH MINERALS) TABS Take 1 tablet by mouth daily.        . NON FORMULARY Inhale 2 puffs into the lungs daily. Respimate Inhaler through clinical study.       . Omega-3 Fatty Acids (FISH OIL) 1200 MG CAPS Take 1 capsule by mouth 2 (two) times daily.        . roflumilast (DALIRESP) 500  MCG TABS tablet Take 500 mcg by mouth daily.        Marland Kitchen tiotropium (SPIRIVA) 18 MCG inhalation capsule Place 18 mcg into inhaler and inhale daily.           Review of Systems: Unchanged   Physical Exam lungs are clear aspiration percussion heart regular sinus rhythm   Diagnostic Tests: Needle biopsy rule out non-small cell lung cancer left upper lobe   Impression: Stage IIa non-small cell lung cancer left upper lobe 2 lesions in left upper lobe   Plan: Left upper lobectomy with node dissection

## 2011-12-17 NOTE — Anesthesia Preprocedure Evaluation (Addendum)
Anesthesia Evaluation  Patient identified by MRN, date of birth, ID band Patient awake    Reviewed: Allergy & Precautions, H&P , NPO status , Patient's Chart, lab work & pertinent test results, reviewed documented beta blocker date and time   Airway Mallampati: II TM Distance: >3 FB Neck ROM: Full    Dental No notable dental hx. (+) Partial Lower and Partial Upper   Pulmonary COPD COPD inhaler,  clear to auscultation  Pulmonary exam normal       Cardiovascular hypertension, On Medications Regular Normal    Neuro/Psych PSYCHIATRIC DISORDERS Negative Neurological ROS     GI/Hepatic negative GI ROS, Neg liver ROS,   Endo/Other  Negative Endocrine ROS  Renal/GU negative Renal ROS  Genitourinary negative   Musculoskeletal   Abdominal   Peds  Hematology negative hematology ROS (+)   Anesthesia Other Findings   Reproductive/Obstetrics negative OB ROS                          Anesthesia Physical Anesthesia Plan  ASA: III  Anesthesia Plan: General   Post-op Pain Management:    Induction: Intravenous  Airway Management Planned: Double Lumen EBT  Additional Equipment: Arterial line, CVP and Ultrasound Guidance Line Placement  Intra-op Plan:   Post-operative Plan: Extubation in OR and Possible Post-op intubation/ventilation  Informed Consent: I have reviewed the patients History and Physical, chart, labs and discussed the procedure including the risks, benefits and alternatives for the proposed anesthesia with the patient or authorized representative who has indicated his/her understanding and acceptance.     Plan Discussed with: CRNA  Anesthesia Plan Comments:         Anesthesia Quick Evaluation

## 2011-12-17 NOTE — Transfer of Care (Signed)
Immediate Anesthesia Transfer of Care Note  Patient: Andrea [name]  Procedure(s) Performed:  VIDEO ASSISTED THORACOSCOPY (VATS)/ LOBECTOMY - Left video assisted thoracoscopy thoracotomy, left upper lobectomy with node dissection  Patient Location: PACU  Anesthesia Type: General  Level of Consciousness: sedated  Airway & Oxygen Therapy: Patient Spontanous Breathing and Patient connected to face mask oxygen  Post-op Assessment: Report given to PACU RN and Post -op Vital signs reviewed and stable  Post vital signs: Reviewed and stable  Complications: No apparent anesthesia complications

## 2011-12-17 NOTE — Preoperative (Signed)
Beta Blockers   Reason not to administer Beta Blockers:Not Applicable. No home beta blockers 

## 2011-12-17 NOTE — Anesthesia Procedure Notes (Signed)
Procedure Name: Intubation Date/Time: 12/17/2011 8:56 AM Performed by: Margaree Mackintosh Pre-anesthesia Checklist: Patient identified, Emergency Drugs available, Patient being monitored, Suction available and Timeout performed Patient Re-evaluated:Patient Re-evaluated prior to inductionOxygen Delivery Method: Circle System Utilized Preoxygenation: Pre-oxygenation with 100% oxygen Intubation Type: IV induction Ventilation: Mask ventilation without difficulty Laryngoscope Size: Mac and 3 Grade View: Grade I Tube type: MLT Endobronchial tube: Left, Double lumen EBT, EBT position confirmed by auscultation and EBT position confirmed by fiberoptic bronchoscope and 37 Fr Number of attempts: 1 Airway Equipment and Method: stylet and fiberoptic brochoscope Placement Confirmation: ETT inserted through vocal cords under direct vision,  positive ETCO2 and breath sounds checked- equal and bilateral Secured at: 28 cm Tube secured with: Tape Dental Injury: Teeth and Oropharynx as per pre-operative assessment

## 2011-12-18 ENCOUNTER — Other Ambulatory Visit (HOSPITAL_COMMUNITY): Payer: Medicare Other

## 2011-12-18 ENCOUNTER — Inpatient Hospital Stay (HOSPITAL_COMMUNITY): Payer: Medicare Other

## 2011-12-18 LAB — BASIC METABOLIC PANEL
BUN: 8 mg/dL (ref 6–23)
CO2: 26 mEq/L (ref 19–32)
Chloride: 108 mEq/L (ref 96–112)
GFR calc Af Amer: >90 mL/min (ref >90)
Potassium: 4.4 mEq/L (ref 3.5–5.1)

## 2011-12-18 LAB — CBC
HCT: 33.1 % — ABNORMAL LOW (ref 36.0–46.0)
MCV: 95.1 fL (ref 78.0–100.0)
Platelets: 193 10*3/uL (ref 150–400)
RBC: 3.48 MIL/uL — ABNORMAL LOW (ref 3.87–5.11)
RDW: 14.1 % (ref 11.5–15.5)
WBC: 10.7 10*3/uL — ABNORMAL HIGH (ref 4.0–10.5)

## 2011-12-18 LAB — GLUCOSE, CAPILLARY
Glucose-Capillary: 97 mg/dL (ref 70–99)
Glucose-Capillary: 132 mg/dL — ABNORMAL HIGH (ref 70–99)
Glucose-Capillary: 144 mg/dL — ABNORMAL HIGH (ref 70–99)

## 2011-12-18 LAB — MRSA PCR SCREENING: MRSA by PCR: NEGATIVE

## 2011-12-18 MED ORDER — BIOTENE DRY MOUTH MT LIQD
15.0000 mL | Freq: Two times a day (BID) | OROMUCOSAL | Status: DC
  Administered 2011-12-18 – 2011-12-22 (×8): 15 mL via OROMUCOSAL

## 2011-12-18 MED FILL — Mupirocin Oint 2%: CUTANEOUS | Qty: 22 | Status: AC

## 2011-12-18 NOTE — Progress Notes (Signed)
                                              1 Day Post-Op Procedure(s) (LRB): VIDEO ASSISTED THORACOSCOPY (VATS)/ LOBECTOMY (Left) Subjective: Stable. I no airleak. Patient is doing well overall. We will discontinue arterial line. Will leave in the ICU one more day. Will DC suction.  Objective: Vital signs in last 24 hours: Temp:  [97 F (36.1 C)-99.4 F (37.4 C)] 97.8 F (36.6 C) (01/08 0300) Pulse Rate:  [46-134] 90  (01/08 0700) Cardiac Rhythm:  [-] Normal sinus rhythm (01/08 0400) Resp:  [10-27] 20  (01/08 0700) BP: (100-151)/(42-94) 133/42 mmHg (01/08 0700) SpO2:  [92 %-100 %] 96 % (01/08 0700) Arterial Line BP: (127-184)/(45-65) 153/47 mmHg (01/08 0700) Weight:  [67.6 kg (149 lb 0.5 oz)-71.5 kg (157 lb 10.1 oz)] 149 lb 0.5 oz (67.6 kg) (01/08 0600)  Hemodynamic parameters for last 24 hours:    Intake/Output from previous day: 01/07 0701 - 01/08 0700 In: 4627 [P.O.:140; I.V.:3537; IV Piggyback:950] Out: 1745 [Urine:1185; Blood:50; Chest Tube:510] Intake/Output this shift:    General appearance: alert Heart: regular rate and rhythm, S1, S2 normal, no murmur, click, rub or gallop Lungs: clear to auscultation bilaterally  Lab Results:  Basename 12/18/11 0400 12/17/11 0642  WBC 10.7* 7.6  HGB 11.0* 12.0  HCT 33.1* 35.6*  PLT 193 211   BMET:  Basename 12/18/11 0400 12/17/11 0642  NA 139 141  K 4.4 3.7  CL 108 106  CO2 26 25  GLUCOSE 103* 94  BUN 8 10  CREATININE 0.54 0.54  CALCIUM 8.4 9.6    PT/INR:  Basename 12/17/11 0642  LABPROT 13.5  INR 1.01   ABG    Component Value Date/Time   PHART 7.395 12/17/2011 0642   HCO3 25.7* 12/17/2011 0642   TCO2 27.0 12/17/2011 0642   ACIDBASEDEF 2.0 05/06/2009 0232   O2SAT 95.7 12/17/2011 0642   CBG (last 3)   Basename 12/18/11 0034 12/17/11 1723  GLUCAP 134* 120*    Assessment/Plan: S/P Procedure(s) (LRB): VIDEO ASSISTED THORACOSCOPY (VATS)/ LOBECTOMY (Left) Decreased suction. Discontinue arterial line.   LOS: 1 day    Andrea [name] PATRICK 12/18/2011

## 2011-12-18 NOTE — Progress Notes (Signed)
eLink Physician-Brief Progress Note Patient Name: CHERELLE MIDKIFF DOB: Nov 01, 1936 MRN: [id number]  Date of Service  12/18/2011   HPI/Events of Note   Best Practice  eICU Interventions  SCDs ordered   Intervention Category Intermediate Interventions: Best-practice therapies (e.g. DVT, beta blocker, etc.)  DETERDING,ELIZABETH 12/18/2011, 2:50 AM

## 2011-12-18 NOTE — Op Note (Signed)
NAMECARIANN, KINNAMON NO.:  [id number]  MEDICAL RECORD NO.:  [id number]  LOCATION:  2313                         FACILITY:  [facility]  PHYSICIAN:  Ines Bloomer, M.D. DATE OF BIRTH:  09-Jan-1936  DATE OF PROCEDURE:  12/17/2011 DATE OF DISCHARGE:                              OPERATIVE REPORT   PREOPERATIVE DIAGNOSIS:  Non-small cell lung cancer, left upper lobe.  POSTOPERATIVE DIAGNOSIS:  Non-small cell lung cancer, left upper lobe.  OPERATION PERFORMED:  Left video-assisted thoracoscopy, left thoracotomy, left upper lobectomy.  SURGEON:  Ines Bloomer, MD  FIRST ASSISTANT:  Billee Cashing, P.A.-C.  ANESTHESIA:  General anesthesia.  DESCRIPTION OF PROCEDURE:  After general anesthesia, the patient __________ dual-lumen tube was inserted and was turned into the left lateral thoracotomy position, prepped and draped in the usual sterile manner, 2 dual-lumen tubes were QAMARKER] inserted.  An exploration was carried out and no intrapleural lesions were seen.  So, we decided to proceed with an incision of about 5-6 cm over the triangle of auscultation.  We partially divided the latissimus, resected, deflated the serratus anteriorly and put a small tube __________.  The patient had multiple nodes and we first dissected out the nodes out of the pulmonary window dissecting out both 5 and 6 nodes.  Then, we dissected around the superior pulmonary vein, looped it with a vascular tape, and dissected superiorly and dissected out the apical posterior branch, and looped the largest branch with a vascular tape.  The other one we ligated with 2-0 silk, clipped, and divided.  Several 10R nodes were dissected free as well as a 4R node dissected down near the mainstem bronchus.  We then went posteriorly and dissected the rest of the posterior mediastinum, dissecting out some more 10R nodes.  We identified the fissure and dissected out several 11L and 10L nodes from around the  lingular branch and divided the lingular branch with Covidien stapler and divided the apical branch with the Covidien stapler, and also divided the superior pulmonary vein with hook Covidien stapler. The patient had a large anterior branch, which we dissected out and divided with the Covidien hook stapler.  Several more 10R nodes were dissected free from the bronchus.  The bronchus was looped with umbilical tape, and then stapled with a TA-30 after checking that the lower lobe would expand.  We then divided the bronchus with a 16 blade and sent the specimen.  There were 2 lesions in the left upper lobe, both were squamous cell cancer on frozen section and the margins were negative.  Inferior pulmonary ligament was taken down the electrocautery.  All bleeding was electrocoagulated.  ProGEL was applied to the staple line.  Two chest tubes were brought into the trocar sites and put in place, tied, and placed with 0 silk.  A Marcaine block was done in the usual fashion.  A single On-Q was inserted in the usual fashion.  The patient was turned to the recovery room in stable condition.     Ines Bloomer, M.D.     DPB/MEDQ  D:  12/17/2011  T:  12/18/2011  Job:  [id number]

## 2011-12-18 NOTE — Progress Notes (Signed)
eLink Physician-Brief Progress Note Patient Name: MAKAYLIE DEDEAUX DOB: June 17, 1936 MRN: [id number]  Date of Service  12/18/2011   HPI/Events of Note   Best Practice  eICU Interventions  SCDs placed   Intervention Category Intermediate Interventions: Best-practice therapies (e.g. DVT, beta blocker, etc.)  DETERDING,ELIZABETH 12/18/2011, 12:16 AM

## 2011-12-19 ENCOUNTER — Inpatient Hospital Stay (HOSPITAL_COMMUNITY): Payer: Medicare Other

## 2011-12-19 LAB — COMPREHENSIVE METABOLIC PANEL WITH GFR
ALT: 14 U/L (ref 0–35)
AST: 21 U/L (ref 0–37)
Albumin: 2.7 g/dL — ABNORMAL LOW (ref 3.5–5.2)
Alkaline Phosphatase: 43 U/L (ref 39–117)
BUN: 7 mg/dL (ref 6–23)
CO2: 27 meq/L (ref 19–32)
Calcium: 8.6 mg/dL (ref 8.4–10.5)
Chloride: 103 meq/L (ref 96–112)
Creatinine, Ser: 0.5 mg/dL (ref 0.50–1.10)
GFR calc Af Amer: >90 mL/min
GFR calc non Af Amer: >90 mL/min
Glucose, Bld: 127 mg/dL — ABNORMAL HIGH (ref 70–99)
Potassium: 3.5 meq/L (ref 3.5–5.1)
Sodium: 136 meq/L (ref 135–145)
Total Bilirubin: 0.6 mg/dL (ref 0.3–1.2)
Total Protein: 5.5 g/dL — ABNORMAL LOW (ref 6.0–8.3)

## 2011-12-19 LAB — GLUCOSE, CAPILLARY
Glucose-Capillary: 116 mg/dL — ABNORMAL HIGH (ref 70–99)
Glucose-Capillary: 124 mg/dL — ABNORMAL HIGH (ref 70–99)
Glucose-Capillary: 110 mg/dL — ABNORMAL HIGH (ref 70–99)

## 2011-12-19 LAB — CBC
HCT: 31.2 % — ABNORMAL LOW (ref 36.0–46.0)
Hemoglobin: 10.6 g/dL — ABNORMAL LOW (ref 12.0–15.0)
MCH: 32.1 pg (ref 26.0–34.0)
MCHC: 34 g/dL (ref 30.0–36.0)
RDW: 14 % (ref 11.5–15.5)

## 2011-12-19 MED ORDER — METOPROLOL TARTRATE 25 MG PO TABS
25.0000 mg | ORAL_TABLET | Freq: Two times a day (BID) | ORAL | Status: DC
  Filled 2011-12-19 (×2): qty 1

## 2011-12-19 MED ORDER — METOPROLOL TARTRATE 12.5 MG HALF TABLET
12.5000 mg | ORAL_TABLET | Freq: Two times a day (BID) | ORAL | Status: DC
  Filled 2011-12-19 (×3): qty 1

## 2011-12-19 MED ORDER — POTASSIUM CHLORIDE 10 MEQ/100ML IV SOLN: 10.0000 meq | INTRAVENOUS | Status: AC

## 2011-12-19 MED ORDER — METOPROLOL TARTRATE 1 MG/ML IV SOLN
2.5000 mg | INTRAVENOUS | Status: DC | PRN
  Administered 2011-12-19: 2.5 mg via INTRAVENOUS
  Filled 2011-12-19: qty 5

## 2011-12-19 MED ORDER — POTASSIUM CHLORIDE 10 MEQ/50ML IV SOLN
10.0000 meq | INTRAVENOUS | Status: AC | PRN
  Administered 2011-12-19 (×3): 10 meq via INTRAVENOUS
  Filled 2011-12-19: qty 150

## 2011-12-19 MED ORDER — GALANTAMINE HYDROBROMIDE 4 MG PO TABS
8.0000 mg | ORAL_TABLET | Freq: Two times a day (BID) | ORAL | Status: DC
  Administered 2011-12-19 – 2011-12-22 (×7): 8 mg via ORAL
  Filled 2011-12-19 (×9): qty 2

## 2011-12-19 MED ORDER — FENTANYL CITRATE 0.05 MG/ML IJ SOLN: 50.0000 ug | INTRAMUSCULAR | Status: DC | PRN

## 2011-12-19 NOTE — Progress Notes (Signed)
                                              2 Days Post-Op Procedure(s) (LRB): VIDEO ASSISTED THORACOSCOPY (VATS)/ LOBECTOMY (Left) Subjective: Patient has no air leak. Chest x-ray shows lung expanded. Patient is not using PCA. Patient is stable overall. Potassium is 3.5. We'll transfer to 3300. Will DC posterior chest tube. Will DC on Q and Foley.  Objective: Vital signs in last 24 hours: Temp:  [97.9 F (36.6 C)-98.5 F (36.9 C)] 98.3 F (36.8 C) (01/09 0719) Pulse Rate:  [45-117] 99  (01/09 0700) Cardiac Rhythm:  [-] Sinus tachycardia (01/09 0700) Resp:  [15-31] 26  (01/09 0700) BP: (122-159)/(40-86) 128/58 mmHg (01/09 0700) SpO2:  [90 %-98 %] 95 % (01/09 0700) Arterial Line BP: (145)/(47) 145/47 mmHg (01/08 0800) Weight:  [67.9 kg (149 lb 11.1 oz)] 67.9 kg (149 lb 11.1 oz) (01/09 0615)  Hemodynamic parameters for last 24 hours:    Intake/Output from previous day: 01/08 0701 - 01/09 0700 In: 2366.3 [P.O.:490; I.V.:1776.3; IV Piggyback:100] Out: 3810 [Urine:3600; Chest Tube:210] Intake/Output this shift:    General appearance: alert Heart: regular rate and rhythm, S1, S2 normal, no murmur, click, rub or gallop Lungs: clear to auscultation bilaterally  Lab Results:  Basename 12/19/11 0350 12/18/11 0400  WBC 12.2* 10.7*  HGB 10.6* 11.0*  HCT 31.2* 33.1*  PLT 170 193   BMET:  Basename 12/19/11 0350 12/18/11 0400  NA 136 139  K 3.5 4.4  CL 103 108  CO2 27 26  GLUCOSE 127* 103*  BUN 7 8  CREATININE 0.50 0.54  CALCIUM 8.6 8.4    PT/INR:  Basename 12/17/11 0642  LABPROT 13.5  INR 1.01   ABG    Component Value Date/Time   PHART 7.395 12/17/2011 0642   HCO3 25.7* 12/17/2011 0642   TCO2 27.0 12/17/2011 0642   ACIDBASEDEF 2.0 05/06/2009 0232   O2SAT 95.7 12/17/2011 0642   CBG (last 3)   Basename 12/18/11 2050 12/18/11 1747 12/18/11 1209  GLUCAP 144* 132* 118*    Assessment/Plan: S/P Procedure(s) (LRB): VIDEO ASSISTED THORACOSCOPY (VATS)/ LOBECTOMY  (Left) Plan to discontinue posterior chest tube, PCA, on Q. Transfer to 3300   LOS: 2 days    Tynlee Bayle PATRICK 12/19/2011

## 2011-12-19 NOTE — Progress Notes (Signed)
UR Completed.  Smantha Boakye Jane [phone] 12/19/2011     

## 2011-12-20 ENCOUNTER — Inpatient Hospital Stay (HOSPITAL_COMMUNITY): Payer: Medicare Other

## 2011-12-20 ENCOUNTER — Other Ambulatory Visit (HOSPITAL_COMMUNITY): Payer: Medicare Other

## 2011-12-20 LAB — BASIC METABOLIC PANEL
Calcium: 9.3 mg/dL (ref 8.4–10.5)
GFR calc Af Amer: >90 mL/min (ref >90)
GFR calc non Af Amer: >90 mL/min (ref >90)
Sodium: 135 mEq/L (ref 135–145)

## 2011-12-20 LAB — CBC
MCH: 32.5 pg (ref 26.0–34.0)
MCHC: 34.2 g/dL (ref 30.0–36.0)
Platelets: 182 10*3/uL (ref 150–400)
RDW: 14.2 % (ref 11.5–15.5)

## 2011-12-20 LAB — GLUCOSE, CAPILLARY
Glucose-Capillary: 107 mg/dL — ABNORMAL HIGH (ref 70–99)
Glucose-Capillary: 95 mg/dL (ref 70–99)

## 2011-12-20 MED ORDER — SODIUM CHLORIDE 0.9 % IJ SOLN
INTRAMUSCULAR | Status: AC
  Filled 2011-12-20: qty 20

## 2011-12-20 MED ORDER — METOPROLOL TARTRATE 12.5 MG HALF TABLET
12.5000 mg | ORAL_TABLET | Freq: Two times a day (BID) | ORAL | Status: DC
  Administered 2011-12-20 – 2011-12-22 (×4): 12.5 mg via ORAL
  Filled 2011-12-20 (×6): qty 1

## 2011-12-20 MED FILL — Galantamine Hydrobromide Tab 4 MG: ORAL | Qty: 2 | Status: AC

## 2011-12-20 NOTE — Progress Notes (Signed)
Pt. Ambulated entire unit on room air and several times oxygen sats dropped down to 87% but pt. Quickly recovered back up in the 90's. Pt. Does wear oxygen at home mainly at night.

## 2011-12-20 NOTE — Progress Notes (Signed)
                                              3 Days Post-Op Procedure(s) (LRB): VIDEO ASSISTED THORACOSCOPY (VATS)/ LOBECTOMY (Left) Subjective: CXR is stable. No air leak. DC chest tube.Home1-2 days.Path is T3N0M0.  Objective: Vital signs in last 24 hours: Temp:  [98.2 F (36.8 C)-99.2 F (37.3 C)] 98.9 F (37.2 C) (01/10 0415) Pulse Rate:  [50-115] 96  (01/10 0415) Cardiac Rhythm:  [-] Sinus tachycardia (01/10 0400) Resp:  [20-32] 26  (01/10 0415) BP: (71-130)/(32-59) 118/53 mmHg (01/10 0415) SpO2:  [93 %-97 %] 95 % (01/10 0415)  Hemodynamic parameters for last 24 hours:    Intake/Output from previous day: 01/09 0701 - 01/10 0700 In: 1164.7 [P.O.:600; I.V.:414.7; IV Piggyback:150] Out: 2775 [Urine:2775] Intake/Output this shift: Total I/O In: -  Out: 100 [Urine:100]  General appearance: alert Heart: regular rate and rhythm, S1, S2 normal, no murmur, click, rub or gallop Lungs: clear to auscultation bilaterally  Lab Results:  Basename 12/20/11 0420 12/19/11 0350  WBC 9.8 12.2*  HGB 10.3* 10.6*  HCT 30.1* 31.2*  PLT 182 170   BMET:  Basename 12/20/11 0420 12/19/11 0350  NA 135 136  K 4.0 3.5  CL 102 103  CO2 28 27  GLUCOSE 102* 127*  BUN 8 7  CREATININE 0.53 0.50  CALCIUM 9.3 8.6    PT/INR: No results found for this basename: LABPROT,INR in the last 72 hours ABG    Component Value Date/Time   PHART 7.395 12/17/2011 0642   HCO3 25.7* 12/17/2011 0642   TCO2 27.0 12/17/2011 0642   ACIDBASEDEF 2.0 05/06/2009 0232   O2SAT 95.7 12/17/2011 0642   CBG (last 3)   Basename 12/20/11 0730 12/19/11 2128 12/19/11 1803  GLUCAP 103* 122* 110*    Assessment/Plan: S/P Procedure(s) (LRB): VIDEO ASSISTED THORACOSCOPY (VATS)/ LOBECTOMY (Left) DC chest tube.   LOS: 3 days    Andrea [name] PATRICK 12/20/2011

## 2011-12-20 NOTE — Progress Notes (Signed)
2nd chest tube D/C at 13:40 pm, pt. Tolerated removal well. Unable to document properly due to previous nurse closing documentation. See flow sheet if any questions.

## 2011-12-20 NOTE — Progress Notes (Signed)
                    [address]            [name] [address]          [phone]     3 Days Post-Op Procedure(s) (LRB): VIDEO ASSISTED THORACOSCOPY (VATS)/ LOBECTOMY (Left)  Subjective: Stable, no complaints.  + cough.  Brief runs AF/PACs this am.   Objective: Vital signs in last 24 hours: Patient Vitals for the past 24 hrs:  BP Temp Temp src Pulse Resp SpO2  12/20/11 0415 118/53 mmHg 98.9 F (37.2 C) Oral 96  26  95 %  12/20/11 0027 106/53 mmHg 99.2 F (37.3 C) Oral 102  24  94 %  12/19/11 2153 100/46 mmHg - - - - -  12/19/11 2119 - - - - - 95 %  12/19/11 2005 130/48 mmHg 98.6 F (37 C) Oral 103  24  93 %  12/19/11 1645 - 98.5 F (36.9 C) Oral - - -  12/19/11 1600 108/59 mmHg - - 50  26  -  12/19/11 1523 - 98.2 F (36.8 C) Oral - - -  12/19/11 1500 107/43 mmHg - - 52  26  96 %  12/19/11 1400 107/51 mmHg - - 115  32  93 %  12/19/11 1300 105/40 mmHg - - 52  23  96 %  12/19/11 1210 - 98.2 F (36.8 C) Oral - - -  12/19/11 1200 118/51 mmHg - - 113  24  96 %  12/19/11 1100 105/38 mmHg - - 62  24  96 %  12/19/11 1000 71/32 mmHg - - 110  22  96 %  12/19/11 0900 116/49 mmHg - - 101  20  96 %  12/19/11 0833 - - - - - 97 %   Current Weight  12/19/11 67.9 kg (149 lb 11.1 oz)     Intake/Output from previous day: 01/09 0701 - 01/10 0700 In: 1164.7 [P.O.:600; I.V.:414.7; IV Piggyback:150] Out: 2775 [Urine:2775]    PHYSICAL EXAM:  Heart: RRR, freq PACs, runs of AF Lungs: generally clear Wound: stable Chest tube: no air leak  Lab Results: CBC: Basename 12/20/11 0420 12/19/11 0350  WBC 9.8 12.2*  HGB 10.3* 10.6*  HCT 30.1* 31.2*  PLT 182 170   BMET:  Basename 12/20/11 0420 12/19/11 0350  NA 135 136  K 4.0 3.5  CL 102 103  CO2 28 27  GLUCOSE 102* 127*  BUN 8 7  CREATININE 0.53 0.50  CALCIUM 9.3 8.6    CXR- stable  Assessment/Plan: S/P Procedure(s) (LRB): VIDEO ASSISTED THORACOSCOPY (VATS)/ LOBECTOMY (Left) D/C chest tube, IVF  today PACs/AF- Lopressor held yesterday secondary to low BP.  Will write hold parameters and continue beta blocker.  Watch rhythm. Mobilize, continue pulm toilet.   LOS: 3 days    Andrea [name] H 12/20/2011

## 2011-12-21 ENCOUNTER — Inpatient Hospital Stay (HOSPITAL_COMMUNITY): Payer: Medicare Other

## 2011-12-21 LAB — GLUCOSE, CAPILLARY
Glucose-Capillary: 100 mg/dL — ABNORMAL HIGH (ref 70–99)
Glucose-Capillary: 97 mg/dL (ref 70–99)

## 2011-12-21 MED ORDER — METOPROLOL TARTRATE 12.5 MG HALF TABLET: 12.5000 mg | ORAL_TABLET | Freq: Two times a day (BID) | ORAL | Status: DC

## 2011-12-21 MED ORDER — TRAMADOL HCL 50 MG PO TABS: 50.0000 mg | ORAL_TABLET | Freq: Four times a day (QID) | ORAL | Status: AC | PRN

## 2011-12-21 NOTE — Discharge Summary (Signed)
[address]            [name] [address]          [phone]         Discharge Summary  Name: Andrea [name] DOB: 07-30-1936 76 y.o. 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 female with a history of COPD who was hospitalized in early 2012 with severe pneumonia. At that time she was found to have 2 nodules in the left upper lobe and a small 3 mm nodule in the left lower lobe. A PET scan was performed which showed increased uptake in the left upper lobe. She was referred to Dr. Edwyna Shell for surgical evaluation. A CT-guided needle biopsy was performed and was positive for non-small cell carcinoma. He recommended proceeding with a left upper lobectomy at this time.   Hospital Course:  The patient was admitted to [facility] on 12/17/2011. All risks, benefits and alternatives of surgery were explained in detail, and the patient agreed to proceed. The patient was taken to the operating room and underwent the above procedure.    The postoperative course has generally been uneventful. She did have one brief episode of atrial fibrillation which converted spontaneously to sinus rhythm, but she continued to have frequent PACs. She was started on low dose Lopressor and since that time has remained in sinus rhythm.  Her pulmonary status has remained stable. Her chest tubes have been removed in the standard fashion and her followup chest x-ray shows no pneumothorax. Her final pathology was positive for invasive poorly differentiated squamous  cell carcinoma (T3, N0).  She has remained afebrile and her vital signs have been stable. She is tolerating a regular diet and is having normal bowel and bladder function. She is ambulating in the halls without difficulty. She will undergo a repeat chest x-ray and hopefully will be ready for discharge home within the next 24 hours if she remains stable.   Recent vital signs:  Filed Vitals:   12/21/11 1200  BP: 120/44  Pulse: 85  Temp: 98.6 F (37 C)  Resp: 28    Recent laboratory studies:  CBC: Basename 12/20/11 0420 12/19/11 0350  WBC 9.8 12.2*  HGB 10.3* 10.6*  HCT 30.1* 31.2*  PLT 182 170   BMET:  Basename 12/20/11 0420 12/19/11 0350  NA 135 136  K 4.0 3.5  CL 102 103  CO2 28 27  GLUCOSE 102* 127*  BUN 8 7  CREATININE 0.53 0.50  CALCIUM 9.3 8.6    PT/INR: No results found for this basename: LABPROT,INR in the last 72 hours  Discharge Medications:  Current Discharge Medication List    START taking these medications   Details  metoprolol tartrate (LOPRESSOR) 12.5 mg TABS Take 0.5 tablets (12.5 mg total) by mouth 2 (two) times daily. Qty: 30 tablet, Refills: 1    traMADol (ULTRAM) 50 MG tablet Take 1-2 tablets (50-100 mg total) by mouth every 6 (six) hours as needed for pain. Qty: 30 tablet, Refills: 0  CONTINUE these medications which have NOT CHANGED   Details  albuterol (PROVENTIL HFA;VENTOLIN HFA) 108 (90 BASE) MCG/ACT inhaler Inhale 2 puffs into the lungs every 4 (four) hours as needed.      albuterol (PROVENTIL) (2.5 MG/3ML) 0.083% nebulizer solution Take 2.5 mg by nebulization every 4 (four) hours as needed.      alendronate (FOSAMAX) 70 MG tablet Take 70 mg by mouth every 7 (seven) days. Take with a full glass of water on an empty stomach.     aspirin EC 81 MG tablet Take 81 mg by mouth daily.      cholecalciferol (VITAMIN D) 1000 UNITS tablet Take 2,000 Units by mouth daily.     Fluticasone-Salmeterol (ADVAIR) 250-50 MCG/DOSE AEPB Inhale 1 puff  into the lungs every 12 (twelve) hours.      galantamine (RAZADYNE ER) 8 MG 24 hr capsule Take 8 mg by mouth daily with breakfast.      lovastatin (MEVACOR) 40 MG tablet Take 40 mg by mouth at bedtime.      memantine (NAMENDA) 10 MG tablet Take 10 mg by mouth 2 (two) times daily.      Multiple Vitamin (MULITIVITAMIN WITH MINERALS) TABS Take 1 tablet by mouth daily.      NON FORMULARY Inhale 2 puffs into the lungs daily. Respimate Inhaler through clinical study.    Omega-3 Fatty Acids (FISH OIL) 1200 MG CAPS Take 1 capsule by mouth 2 (two) times daily.      roflumilast (DALIRESP) 500 MCG TABS tablet Take 500 mcg by mouth daily.      tiotropium (SPIRIVA) 18 MCG inhalation capsule Place 18 mcg into inhaler and inhale daily.        STOP taking these medications     losartan (COZAAR) 50 MG tablet         Discharge Instructions:  The patient is to refrain from driving, heavy lifting or strenuous activity.  May shower daily and clean incisions with soap and water.  May resume regular diet.  Discharge Orders    Future Appointments: Provider: Department: Dept Phone: Center:   01/01/2012 1:15 PM D Karle Plumber, MD [facility] [phone] [facility]      Follow-up Information    Follow up with Cameron Proud, MD on 01/01/2012. (Have a chest x-ray at 12:30, then see MD at 1:15)    Contact information:   [address] [place] [address] [phone]           Esco Joslyn H 12/21/2011, 2:09 PM

## 2011-12-21 NOTE — Progress Notes (Signed)
                                              4 Days Post-Op Procedure(s) (LRB): VIDEO ASSISTED THORACOSCOPY (VATS)/ LOBECTOMY (Left) Subjective: Stable CXR is stable with lung expanded. Talked to patient and family. Plan dc tomorrow Objective: Vital signs in last 24 hours: Temp:  [97.8 F (36.6 C)-98.4 F (36.9 C)] 98.2 F (36.8 C) (01/11 0413) Pulse Rate:  [81-118] 93  (01/11 0413) Cardiac Rhythm:  [-] Normal sinus rhythm;Sinus tachycardia (01/11 0400) Resp:  [16-25] 25  (01/11 0413) BP: (91-124)/(45-54) 100/52 mmHg (01/11 0413) SpO2:  [94 %-98 %] 97 % (01/11 0413)  Hemodynamic parameters for last 24 hours:    Intake/Output from previous day: 01/10 0701 - 01/11 0700 In: 1080 [P.O.:1040; I.V.:40] Out: 1240 [Urine:1240] Intake/Output this shift:    General appearance: alert Heart: regular rate and rhythm, S1, S2 normal, no murmur, click, rub or gallop Lungs: clear to auscultation bilaterally  Lab Results:  Basename 12/20/11 0420 12/19/11 0350  WBC 9.8 12.2*  HGB 10.3* 10.6*  HCT 30.1* 31.2*  PLT 182 170   BMET:  Basename 12/20/11 0420 12/19/11 0350  NA 135 136  K 4.0 3.5  CL 102 103  CO2 28 27  GLUCOSE 102* 127*  BUN 8 7  CREATININE 0.53 0.50  CALCIUM 9.3 8.6    PT/INR: No results found for this basename: LABPROT,INR in the last 72 hours ABG    Component Value Date/Time   PHART 7.395 12/17/2011 0642   HCO3 25.7* 12/17/2011 0642   TCO2 27.0 12/17/2011 0642   ACIDBASEDEF 2.0 05/06/2009 0232   O2SAT 95.7 12/17/2011 0642   CBG (last 3)   Basename 12/20/11 2152 12/20/11 1651 12/20/11 1208  GLUCAP 104* 95 107*    Assessment/Plan: S/P Procedure(s) (LRB): VIDEO ASSISTED THORACOSCOPY (VATS)/ LOBECTOMY (Left) dc cvp line. wean oxygen   LOS: 4 days    Andrea [name] PATRICK 12/21/2011

## 2011-12-22 ENCOUNTER — Inpatient Hospital Stay (HOSPITAL_COMMUNITY): Payer: Medicare Other

## 2011-12-22 NOTE — Progress Notes (Signed)
                                              5 Days Post-Op Procedure(s) (LRB): VIDEO ASSISTED THORACOSCOPY (VATS)/ LOBECTOMY (Left) Subjective: Chest x-ray is stable. Will discharge home today.  Objective: Vital signs in last 24 hours: Temp:  [97.1 F (36.2 C)-98.9 F (37.2 C)] 98.1 F (36.7 C) (01/12 0742) Pulse Rate:  [85-126] 85  (01/12 0742) Cardiac Rhythm:  [-] Normal sinus rhythm (01/12 0400) Resp:  [18-28] 22  (01/12 0742) BP: (104-120)/(44-74) 111/50 mmHg (01/12 0742) SpO2:  [91 %-98 %] 97 % (01/12 0742)  Hemodynamic parameters for last 24 hours:    Intake/Output from previous day: 01/11 0701 - 01/12 0700 In: 360 [P.O.:360] Out: -  Intake/Output this shift:    General appearance: alert Heart: regular rate and rhythm, S1, S2 normal, no murmur, click, rub or gallop Lungs: clear to auscultation bilaterally  Lab Results:  [facility] 12/20/11 0420  WBC 9.8  HGB 10.3*  HCT 30.1*  PLT 182   BMET:  Basename 12/20/11 0420  NA 135  K 4.0  CL 102  CO2 28  GLUCOSE 102*  BUN 8  CREATININE 0.53  CALCIUM 9.3    PT/INR: No results found for this basename: LABPROT,INR in the last 72 hours ABG    Component Value Date/Time   PHART 7.395 12/17/2011 0642   HCO3 25.7* 12/17/2011 0642   TCO2 27.0 12/17/2011 0642   ACIDBASEDEF 2.0 05/06/2009 0232   O2SAT 95.7 12/17/2011 0642   CBG (last 3)   Basename 12/22/11 0811 12/21/11 2141 12/21/11 1728  GLUCAP 91 107* 97    Assessment/Plan: S/P Procedure(s) (LRB): VIDEO ASSISTED THORACOSCOPY (VATS)/ LOBECTOMY (Left) Plan for discharge: see discharge orders   LOS: 5 days    Andrea [name] PATRICK 12/22/2011

## 2011-12-22 NOTE — Progress Notes (Signed)
Pt discharged home with family per MD order. All discharge instruction reviewed and all questions answered. Pt belonging sent home with patient including, glasses, dentures, and hearing aids.

## 2011-12-28 ENCOUNTER — Other Ambulatory Visit: Payer: Self-pay | Admitting: Thoracic Surgery

## 2011-12-28 DIAGNOSIS — C3410 Malignant neoplasm of upper lobe, unspecified bronchus or lung: Secondary | ICD-10-CM

## 2012-01-01 ENCOUNTER — Encounter: Payer: Self-pay | Admitting: Thoracic Surgery

## 2012-01-01 ENCOUNTER — Ambulatory Visit
Admission: RE | Admit: 2012-01-01 | Discharge: 2012-01-01 | Disposition: A | Discharge status: Home / Self Care | Payer: Medicare Other | Source: Ambulatory Visit | Attending: Thoracic Surgery | Admitting: Thoracic Surgery

## 2012-01-01 ENCOUNTER — Ambulatory Visit (INDEPENDENT_AMBULATORY_CARE_PROVIDER_SITE_OTHER): Payer: Self-pay | Admitting: Thoracic Surgery

## 2012-01-01 VITALS — BP 118/72 | HR 98 | Temp 97.2°F | Resp 18 | Ht 60.0 in | Wt 143.0 lb

## 2012-01-01 DIAGNOSIS — C3410 Malignant neoplasm of upper lobe, unspecified bronchus or lung: Secondary | ICD-10-CM

## 2012-01-01 NOTE — Progress Notes (Signed)
HPI returns today. Incisions are healing well. We removed the chest tube sutures. Chest x-ray is stable she is status post a left upper lobectomy for non-small cell lung cancer stage Ib. I'll refer her to Dr. Gwenyth Bouillon for evaluation told her to gradually increase her activities. I will see her back in and 4 weeks with a chest   Current Outpatient Prescriptions  Medication Sig Dispense Refill  . albuterol (PROVENTIL HFA;VENTOLIN HFA) 108 (90 BASE) MCG/ACT inhaler Inhale 2 puffs into the lungs every 4 (four) hours as needed.        Marland Kitchen albuterol (PROVENTIL) (2.5 MG/3ML) 0.083% nebulizer solution Take 2.5 mg by nebulization every 4 (four) hours as needed.        Marland Kitchen alendronate (FOSAMAX) 70 MG tablet Take 70 mg by mouth every 7 (seven) days. Take with a full glass of water on an empty stomach.       Marland Kitchen aspirin EC 81 MG tablet Take 81 mg by mouth daily.        . cholecalciferol (VITAMIN D) 1000 UNITS tablet Take 2,000 Units by mouth daily.       . Fluticasone-Salmeterol (ADVAIR) 250-50 MCG/DOSE AEPB Inhale 1 puff into the lungs every 12 (twelve) hours.        Marland Kitchen galantamine (RAZADYNE ER) 8 MG 24 hr capsule Take 8 mg by mouth daily with breakfast.        . lovastatin (MEVACOR) 40 MG tablet Take 40 mg by mouth at bedtime.        . memantine (NAMENDA) 10 MG tablet Take 10 mg by mouth 2 (two) times daily.        . metoprolol tartrate (LOPRESSOR) 12.5 mg TABS Take 0.5 tablets (12.5 mg total) by mouth 2 (two) times daily.  30 tablet  1  . Multiple Vitamin (MULITIVITAMIN WITH MINERALS) TABS Take 1 tablet by mouth daily.        . NON FORMULARY Inhale 2 puffs into the lungs daily. Respimate Inhaler through clinical study.      . Omega-3 Fatty Acids (FISH OIL) 1200 MG CAPS Take 1 capsule by mouth 2 (two) times daily.        . roflumilast (DALIRESP) 500 MCG TABS tablet Take 500 mcg by mouth daily.        Marland Kitchen tiotropium (SPIRIVA) 18 MCG inhalation capsule Place 18 mcg into inhaler and inhale daily.        . traMADol  (ULTRAM) 50 MG tablet Take 1-2 tablets (50-100 mg total) by mouth every 6 (six) hours as needed for pain.  30 tablet  0     Review of Systems: No change   Physical Exam lungs were clear to auscultation percussion incision is healing well chest x-ray shows normal postoperative changes   Diagnostic Tests:   Impression: Status post left upper lobectomy for stage Ib non-small cell lung cancer   Plan: Return in 4 weeks

## 2012-01-02 ENCOUNTER — Telehealth: Payer: Self-pay | Admitting: Internal Medicine

## 2012-01-02 NOTE — Telephone Encounter (Signed)
called pt and she is aware of 1/28 appt,letter faxed to  Dr Edwyna Shell   aom

## 2012-01-03 ENCOUNTER — Telehealth: Payer: Self-pay | Admitting: Internal Medicine

## 2012-01-03 NOTE — Telephone Encounter (Signed)
Referred by Dr. Edwyna Shell, Dx- Lung Ca

## 2012-01-04 ENCOUNTER — Other Ambulatory Visit: Payer: Self-pay | Admitting: *Deleted

## 2012-01-04 DIAGNOSIS — C3490 Malignant neoplasm of unspecified part of unspecified bronchus or lung: Secondary | ICD-10-CM

## 2012-01-07 ENCOUNTER — Ambulatory Visit (HOSPITAL_BASED_OUTPATIENT_CLINIC_OR_DEPARTMENT_OTHER): Payer: Medicare Other | Admitting: Internal Medicine

## 2012-01-07 ENCOUNTER — Ambulatory Visit: Payer: Medicare Other

## 2012-01-07 ENCOUNTER — Encounter: Payer: Self-pay | Admitting: Internal Medicine

## 2012-01-07 ENCOUNTER — Other Ambulatory Visit: Payer: Medicare Other | Admitting: Lab

## 2012-01-07 VITALS — BP 127/69 | HR 100 | Temp 97.0°F | Ht 60.0 in | Wt 138.3 lb

## 2012-01-07 DIAGNOSIS — C3490 Malignant neoplasm of unspecified part of unspecified bronchus or lung: Secondary | ICD-10-CM

## 2012-01-07 LAB — COMPREHENSIVE METABOLIC PANEL
CO2: 24 mEq/L (ref 19–32)
Creatinine, Ser: 0.58 mg/dL (ref 0.50–1.10)
Glucose, Bld: 92 mg/dL (ref 70–99)
Total Bilirubin: 0.5 mg/dL (ref 0.3–1.2)

## 2012-01-07 LAB — CBC WITH DIFFERENTIAL/PLATELET
Eosinophils Absolute: 0.3 10*3/uL (ref 0.0–0.5)
HCT: 35.7 % (ref 34.8–46.6)
LYMPH%: 42.4 % (ref 14.0–49.7)
MCHC: 34.2 g/dL (ref 31.5–36.0)
MONO#: 0.8 10*3/uL (ref 0.1–0.9)
NEUT%: 44.1 % (ref 38.4–76.8)
Platelets: 313 10*3/uL (ref 145–400)
WBC: 9 10*3/uL (ref 3.9–10.3)

## 2012-01-07 NOTE — Progress Notes (Signed)
[facility] CONSULT NOTE  REASON FOR CONSULTATION:  76 years old white female diagnosed with non-small cell lung cancer.  HPI Andrea [name] is a 76 y.o. female was past medical history significant for hypertension, COPD, peripheral vascular disease, and long history of smoking. The patient mentions that in the fall 2012, she had several episodes of questionable pneumonia. She was seen by her primary care physician Dr. Waynard Edwards and chest x-ray was performed which showed questionable nodules in the left lung. CT scan of the chest followed by a PET scan on 11/28/2011 showed Within the left upper lobe, there is a hypermetabolic pulmonary nodule measuring 14 mm (image 60) with SUV max = 7.0. Within the same left upper lobe, there is a 10 mm nodule with SUV max = 5.6. There are no hypermetabolic mediastinal lymph nodes. A 9 mm precarinal lymph node has metabolic activity below blood pool activity. CT of the head on 11/29/2011 and showed no acute or focal findings and no evidence of metastatic disease. On 12/06/2011 the patient underwent CT-guided fine needle aspiration of a left upper lobe nodule by interventional radiology and the final cytology showed non-small cell lung cancer. The patient was then referred to Dr. Edwyna Shell and on 12/17/2011, she underwent Left video-assisted thoracoscopy, left  thoracotomy, left upper lobectomy. The final pathology showed invasive poorly differentiated squamous cell carcinoma, 1.5 CM and 1.1 CM, confined within the lung parenchyma.  No evidence of angiolymphatic invasion or visceral pleural involvement. The dissected lymph nodes from 11 L, 10 L, 4 L, 5 and 6 were negative for malignancy. The patient is recovering well from her surgery. Dr. Edwyna Shell kindly referred him to me today for evaluation and discussion of her adjuvant chemotherapy options. The patient denied having any significant complaints except for mild soreness in the left side of her chest as  well as shortness of breath with exertion and dry cough. She lost around 20 pounds in the last 6 months. No other significant complaints. She was accompanied by her sister Andrea [name]. 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 She has a history of smoking for about 35 years and unfortunately she continues to smoke.   @SFHPI @  Past Medical History  Diagnosis Date  . Hypertension   . PVD (peripheral vascular disease)   . History of shingles 1985  . Sleep related hypoxia     USES CPAP  . Tobacco abuse   . Complication of anesthesia     confusion after surgery  . COPD (chronic obstructive pulmonary disease)     Emphezma  . Hypoxia, sleep related     Oxygen 2 liters at night, HOB  approx 30-45 degree  . Arthritis     Hands  . DEMENTIA   . Cancer     l lung cancer  . Hearing loss     Past Surgical History  Procedure Date  . Appendectomy     1957  . Elbow surgery 2008    right elbow  . Tonsillectomy   . Lung biospy     Family History  Problem Relation Age of Onset  . Stroke Mother   . Stroke Father   . Cancer Sister   . Anesthesia problems Neg Hx     Social History History  Substance Use Topics  . Smoking status: Current Some Day Smoker -- 1.0 packs/day for 40 years    Types: Cigarettes  . Smokeless tobacco: Never Used  . Alcohol Use: Yes  rarely    No Known Allergies  Current Outpatient Prescriptions  Medication Sig Dispense Refill  . traMADol (ULTRAM) 50 MG tablet Take 50 mg by mouth every 6 (six) hours as needed.      Marland Kitchen albuterol (PROVENTIL HFA;VENTOLIN HFA) 108 (90 BASE) MCG/ACT inhaler Inhale 2 puffs into the lungs every 4 (four) hours as needed.        Marland Kitchen albuterol (PROVENTIL) (2.5 MG/3ML) 0.083% nebulizer solution Take 2.5 mg by nebulization every 4 (four) hours as needed.        Marland Kitchen alendronate (FOSAMAX) 70 MG tablet Take 70 mg by mouth every 7 (seven) days. Take with a full glass of water on an empty stomach.       Marland Kitchen aspirin EC 81 MG tablet Take 81 mg  by mouth daily.        . cholecalciferol (VITAMIN D) 1000 UNITS tablet Take 2,000 Units by mouth daily.       . Fluticasone-Salmeterol (ADVAIR) 250-50 MCG/DOSE AEPB Inhale 1 puff into the lungs every 12 (twelve) hours.        Marland Kitchen galantamine (RAZADYNE ER) 8 MG 24 hr capsule Take 8 mg by mouth daily with breakfast.        . lovastatin (MEVACOR) 40 MG tablet Take 40 mg by mouth at bedtime.        . memantine (NAMENDA) 10 MG tablet Take 10 mg by mouth 2 (two) times daily.        . metoprolol tartrate (LOPRESSOR) 12.5 mg TABS Take 0.5 tablets (12.5 mg total) by mouth 2 (two) times daily.  30 tablet  1  . Multiple Vitamin (MULITIVITAMIN WITH MINERALS) TABS Take 1 tablet by mouth daily.        . NON FORMULARY Inhale 2 puffs into the lungs daily. Respimate Inhaler through clinical study.      . Omega-3 Fatty Acids (FISH OIL) 1200 MG CAPS Take 1 capsule by mouth 2 (two) times daily.        . roflumilast (DALIRESP) 500 MCG TABS tablet Take 500 mcg by mouth daily.        Marland Kitchen tiotropium (SPIRIVA) 18 MCG inhalation capsule Place 18 mcg into inhaler and inhale daily.          Review of Systems  A comprehensive review of systems was negative except for: Constitutional: positive for fatigue and weight loss Respiratory: positive for cough, dyspnea on exertion and pleurisy/chest pain  Physical Exam  ZOX:WRUEA, healthy, no distress, well nourished and well developed SKIN: skin color, texture, turgor are normal HEAD: Normocephalic EYES: normal, PERRLA EARS: External ears normal OROPHARYNX:no exudate, no erythema and lips, buccal mucosa, and tongue normal  NECK: supple, no adenopathy LYMPH:  no palpable lymphadenopathy, no hepatosplenomegaly BREAST:not examined LUNGS: clear to auscultation  HEART: regular rate & rhythm, no murmurs and no gallops ABDOMEN:abdomen soft, non-tender and normal bowel sounds BACK: Back symmetric, no curvature. EXTREMITIES:no joint deformities, effusion, or inflammation, no edema,  no skin discoloration, no clubbing, no cyanosis  NEURO: alert & oriented x 3 with fluent speech, no focal motor/sensory deficits    Studies/Results: Dg Chest 2 View  01/01/2012  *RADIOLOGY REPORT*  Clinical Data: Status post lung surgery.  CHEST - 2 VIEW  Comparison: 12/22/2011.  Findings: Trachea is midline.  Heart size stable.  Thoracic aorta is calcified.  Postoperative changes and volume loss are seen in the left hemithorax.  Resolving left basilar atelectasis.  Right lung is clear.  No definite pleural fluid.  IMPRESSION:  Postoperative changes and volume loss in the left hemithorax with resolving left basilar atelectasis.  Original Report Authenticated By: Reyes Ivan, M.D.   Dg Chest 2 View  12/21/2011  *RADIOLOGY REPORT*  Clinical Data: Chest tube removal.  CHEST - 2 VIEW  Comparison: 12/20/2011  Findings: Left chest tube has been removed in the interval without evidence for residual pneumothorax.  Left IJ central line tip projects at the innominate vein confluence. The cardiopericardial silhouette is enlarged. Interstitial markings are diffusely coarsened with chronic features.  No overt airspace pulmonary edema or focal lung consolidation.  No substantial pleural effusion. Bones are diffusely demineralized.  IMPRESSION: Left chest tube removal without evidence for pneumothorax.  Original Report Authenticated By: ERIC A. MANSELL, M.D.   Dg Chest 2 View  12/17/2011  *RADIOLOGY REPORT*  Clinical Data: Preoperative radiograph.  VATS.  CHEST - 2 VIEW  Comparison: 12/06/2011.  Findings: Left upper lobe 13 mm pulmonary nodule appears little changed compared to the prior exam of 12/06/2011.  Bilateral parenchymal scarring is present at the lung bases.  There is no airspace disease.  Emphysema.  Aortic arch atherosclerosis.  IMPRESSION: No acute cardiopulmonary disease.  Unchanged 13 mm left upper lobe pulmonary nodule.  Original Report Authenticated By: Andreas Newport, M.D.   Dg Chest Port 1  View  12/22/2011  *RADIOLOGY REPORT*  Clinical Data: Lung cancer  PORTABLE CHEST - 1 VIEW  Comparison: Yesterday  Findings: Mild cardiomegaly.  Bibasilar atelectasis.  Upper lung zones clear.  No pneumothorax.  Left hemidiaphragm remains elevated compatible with volume loss.  No pneumothorax.  IMPRESSION: Stable bibasilar atelectasis.  No pneumothorax.  Original Report Authenticated By: Donavan Burnet, M.D.   Dg Chest Port 1 View  12/20/2011  *RADIOLOGY REPORT*  Clinical Data: Follow-up.  Shortness of breath  PORTABLE CHEST - 1 VIEW  Comparison: 12/19/2011  Findings: The lateral most of the left sided chest tubes has been removed and the medial left chest tube and left internal jugular CVP are stable in position. Heart and mediastinal contours are stable.  The lung fields are notable for bibasilar subsegmental atelectasis and some mild pulmonary vascular congestion.  The lungs are otherwise clear.  No pneumothorax is seen.  IMPRESSION: Stable cardiopulmonary appearance post single chest tube removal.  Original Report Authenticated By: Bertha Stakes, M.D.   Dg Chest Port 1 View  12/19/2011  *RADIOLOGY REPORT*  Clinical Data: Follow up.  Postop chest tubes  PORTABLE CHEST - 1 VIEW  Comparison: 12/18/2011  Findings: Two chest tubes are present on the left.  No pneumothorax.  Mild bibasilar atelectasis is unchanged.  Negative for heart failure.  Surgical clips in the left upper hilar region. Central venous catheter tip in the SVC, unchanged.  IMPRESSION: Mild bibasilar atelectasis, unchanged.  No pneumothorax.  Original Report Authenticated By: Camelia Phenes, M.D.   Dg Chest Port 1 View  12/18/2011  *RADIOLOGY REPORT*  Clinical Data: Chest tubes.  PORTABLE CHEST - 1 VIEW  Comparison: 12/17/2011  Findings: Support devices including two left chest tubes remain in place, unchanged.  Small amount of subcutaneous air in the left chest wall, stable.  No visible pneumothorax.  There is bibasilar atelectasis.   Heart is borderline in size.  No real change.  IMPRESSION: No interval change.  Original Report Authenticated By: Cyndie Chime, M.D.   Dg Chest Portable 1 View  12/17/2011  *RADIOLOGY REPORT*  Clinical Data: Postop left VATS.  Central line insertion.  PORTABLE CHEST - 1 VIEW  Comparison: 12/17/2011.  Findings: Trachea is midline.  Heart size normal.  Left IJ central line tip projects over the upper SVC.  Two left chest tubes are in place.  Heart size stable.  Postoperative changes and volume loss are seen in the left hemithorax.  No definite pneumothorax.  Subcutaneous air is seen along the left chest wall.  Mild bibasilar air space disease, right greater than left.  No definite pleural fluid.  IMPRESSION:  1.  Postoperative changes and volume loss in the left hemithorax without pneumothorax. 2.  Bibasilar air space disease, right greater than left.  Original Report Authenticated By: Reyes Ivan, M.D.     ASSESSMENT AND PLAN: this is a very pleasant 76 years old white female recently diagnosed with a stage IIB (T3, N0, M0) non-small cell lung cancer consistent with invasive squamous cell carcinoma. She status post left upper lobectomy with lymph node dissection. I have a lengthy discussion with the patient and her sister today about her disease stage, prognosis and treatment options. I explained to the patient that there is a survival benefit for adjuvant chemotherapy for patient with a stage IIB non-small cell lung cancer in the range of 5-10%.  I recommended for the patient and platinum based chemotherapy most likely in the form of carboplatin for AUC of 5 and paclitaxel 175 mg/M2 giving every 3 weeks with Neulasta support. I also discussed with the patient adverse effect of the chemotherapy including but not limited to alopecia, myelosuppression, peripheral neuropathy, liver or renal dysfunction, nausea and vomiting. I also explained to the patient that the best time for the adjuvant chemotherapy is  between 4-8 weeks after her surgical resection. The patient and her sister would like to have some time to think about this option before making a final decision. If she declined the adjuvant chemotherapy I will continue the patient observation with repeat CT scan of the chest with contrast in 3 months. If the patient decided to proceed with systemic chemotherapy I will arrange for her to have chemotherapy education class in the next one to 2 weeks and I would see the patient back for followup visit in 3 weeks with the start of cycle #1. I gave the patient and her sister the time to ask questions and I answered them completely to their satisfactions. The patient is expected to call in the next few days with her final decision regarding the adjuvant chemotherapy.  All questions were answered. The patient knows to call the clinic with any problems, questions or concerns. We can certainly see the patient much sooner if necessary.  Thank you so much for allowing me to participate in the care of Andrea [name]. I will continue to follow up the patient with you and assist in her care.  I spent 25 minutes counseling the patient face to face. The total time spent in the appointment was 55 minutes.   [place] K. 01/07/2012, 10:17 PM

## 2012-01-30 ENCOUNTER — Other Ambulatory Visit: Payer: Self-pay | Admitting: Thoracic Surgery

## 2012-01-30 DIAGNOSIS — C3410 Malignant neoplasm of upper lobe, unspecified bronchus or lung: Secondary | ICD-10-CM

## 2012-02-05 ENCOUNTER — Ambulatory Visit: Payer: Self-pay | Admitting: Thoracic Surgery

## 2012-02-11 ENCOUNTER — Other Ambulatory Visit: Payer: Self-pay | Admitting: Thoracic Surgery

## 2012-02-11 DIAGNOSIS — C3410 Malignant neoplasm of upper lobe, unspecified bronchus or lung: Secondary | ICD-10-CM

## 2012-02-12 ENCOUNTER — Encounter: Payer: Self-pay | Admitting: Thoracic Surgery

## 2012-02-12 ENCOUNTER — Ambulatory Visit
Admission: RE | Admit: 2012-02-12 | Discharge: 2012-02-12 | Disposition: A | Discharge status: Home / Self Care | Payer: Medicare Other | Source: Ambulatory Visit | Attending: Thoracic Surgery | Admitting: Thoracic Surgery

## 2012-02-12 ENCOUNTER — Ambulatory Visit (INDEPENDENT_AMBULATORY_CARE_PROVIDER_SITE_OTHER): Payer: Self-pay | Admitting: Thoracic Surgery

## 2012-02-12 VITALS — BP 121/78 | HR 110 | Resp 20 | Ht 60.0 in | Wt 143.0 lb

## 2012-02-12 DIAGNOSIS — C3410 Malignant neoplasm of upper lobe, unspecified bronchus or lung: Secondary | ICD-10-CM

## 2012-02-12 DIAGNOSIS — Z902 Acquired absence of lung [part of]: Secondary | ICD-10-CM

## 2012-02-12 DIAGNOSIS — Z9889 Other specified postprocedural states: Secondary | ICD-10-CM

## 2012-02-12 NOTE — Progress Notes (Signed)
HPI returns for followup today. The patient returns now 3 months and say left upper lobectomy for 2 squamous cell cancers. She was offered chemotherapy but declined. Her chest x-ray shows normal postoperative changes in. Incisions are well-healed. She she is doing well overall. We'll see her back again in 4 months with CT scan.  Current Outpatient Prescriptions  Medication Sig Dispense Refill  . albuterol (PROVENTIL HFA;VENTOLIN HFA) 108 (90 BASE) MCG/ACT inhaler Inhale 2 puffs into the lungs every 4 (four) hours as needed.        Marland Kitchen albuterol (PROVENTIL) (2.5 MG/3ML) 0.083% nebulizer solution Take 2.5 mg by nebulization every 4 (four) hours as needed.        Marland Kitchen alendronate (FOSAMAX) 70 MG tablet Take 70 mg by mouth every 7 (seven) days. Take with a full glass of water on an empty stomach.       Marland Kitchen aspirin EC 81 MG tablet Take 81 mg by mouth daily.        . cholecalciferol (VITAMIN D) 1000 UNITS tablet Take 2,000 Units by mouth daily.       . Fluticasone-Salmeterol (ADVAIR) 250-50 MCG/DOSE AEPB Inhale 1 puff into the lungs every 12 (twelve) hours.        Marland Kitchen galantamine (RAZADYNE ER) 8 MG 24 hr capsule Take 8 mg by mouth daily with breakfast.        . lovastatin (MEVACOR) 40 MG tablet Take 40 mg by mouth at bedtime.        . memantine (NAMENDA) 10 MG tablet Take 10 mg by mouth 2 (two) times daily.        . metoprolol tartrate (LOPRESSOR) 12.5 mg TABS Take 0.5 tablets (12.5 mg total) by mouth 2 (two) times daily.  30 tablet  1  . Multiple Vitamin (MULITIVITAMIN WITH MINERALS) TABS Take 1 tablet by mouth daily.        . NON FORMULARY Inhale 2 puffs into the lungs daily. Respimate Inhaler through clinical study.      . Omega-3 Fatty Acids (FISH OIL) 1200 MG CAPS Take 1 capsule by mouth 2 (two) times daily.        . roflumilast (DALIRESP) 500 MCG TABS tablet Take 500 mcg by mouth daily.        Marland Kitchen tiotropium (SPIRIVA) 18 MCG inhalation capsule Place 18 mcg into inhaler and inhale daily.        . traMADol  (ULTRAM) 50 MG tablet Take 50 mg by mouth every 6 (six) hours as needed.         Review of Systems: Unchanged  Physical Exam lungs are clear to auscultation percussion   Diagnostic Tests: Chest x-ray shows the normal postoperative changes on the left  Impression: Status post resection of squamous cell cancer left upper lobe stage IIa  Plan: Return in 4 months with CT scan of the chest

## 2012-05-26 ENCOUNTER — Other Ambulatory Visit: Payer: Self-pay | Admitting: Thoracic Surgery

## 2012-05-26 DIAGNOSIS — D381 Neoplasm of uncertain behavior of trachea, bronchus and lung: Secondary | ICD-10-CM

## 2012-06-04 ENCOUNTER — Encounter: Payer: Self-pay | Admitting: Thoracic Surgery

## 2012-06-04 ENCOUNTER — Ambulatory Visit
Admission: RE | Admit: 2012-06-04 | Discharge: 2012-06-04 | Disposition: A | Discharge status: Home / Self Care | Payer: Medicare Other | Source: Ambulatory Visit | Attending: Thoracic Surgery | Admitting: Thoracic Surgery

## 2012-06-04 ENCOUNTER — Ambulatory Visit (INDEPENDENT_AMBULATORY_CARE_PROVIDER_SITE_OTHER): Payer: Medicare Other | Admitting: Thoracic Surgery

## 2012-06-04 VITALS — BP 121/63 | HR 62 | Resp 18 | Ht 60.0 in | Wt 142.0 lb

## 2012-06-04 DIAGNOSIS — D381 Neoplasm of uncertain behavior of trachea, bronchus and lung: Secondary | ICD-10-CM

## 2012-06-04 DIAGNOSIS — Z9889 Other specified postprocedural states: Secondary | ICD-10-CM

## 2012-06-04 DIAGNOSIS — C3410 Malignant neoplasm of upper lobe, unspecified bronchus or lung: Secondary | ICD-10-CM

## 2012-06-04 NOTE — Progress Notes (Signed)
HPI patient returns for followup. CT scan today showed no evidence for recurrence or cancer. She still smoking. We will arrange for a followup by Dr. Gwenyth Bouillon. Her incisions are well-healed.  Current Outpatient Prescriptions  Medication Sig Dispense Refill  . albuterol (PROVENTIL HFA;VENTOLIN HFA) 108 (90 BASE) MCG/ACT inhaler Inhale 2 puffs into the lungs every 4 (four) hours as needed.        Marland Kitchen albuterol (PROVENTIL) (2.5 MG/3ML) 0.083% nebulizer solution Take 2.5 mg by nebulization every 4 (four) hours as needed.        Marland Kitchen alendronate (FOSAMAX) 70 MG tablet Take 70 mg by mouth every 7 (seven) days. Take with a full glass of water on an empty stomach.       Marland Kitchen aspirin EC 81 MG tablet Take 81 mg by mouth daily.        . cholecalciferol (VITAMIN D) 1000 UNITS tablet Take 2,000 Units by mouth daily.       . Fluticasone-Salmeterol (ADVAIR) 250-50 MCG/DOSE AEPB Inhale 1 puff into the lungs every 12 (twelve) hours.        Marland Kitchen galantamine (RAZADYNE ER) 8 MG 24 hr capsule Take 8 mg by mouth daily with breakfast.        . lovastatin (MEVACOR) 40 MG tablet Take 40 mg by mouth at bedtime.        . memantine (NAMENDA) 10 MG tablet Take 10 mg by mouth 2 (two) times daily.        . metoprolol tartrate (LOPRESSOR) 12.5 mg TABS Take 0.5 tablets (12.5 mg total) by mouth 2 (two) times daily.  30 tablet  1  . Multiple Vitamin (MULITIVITAMIN WITH MINERALS) TABS Take 1 tablet by mouth daily.        . NON FORMULARY Inhale 2 puffs into the lungs daily. Respimate Inhaler through clinical study.      . Omega-3 Fatty Acids (FISH OIL) 1200 MG CAPS Take 1 capsule by mouth 2 (two) times daily.        . roflumilast (DALIRESP) 500 MCG TABS tablet Take 500 mcg by mouth daily.        Marland Kitchen tiotropium (SPIRIVA) 18 MCG inhalation capsule Place 18 mcg into inhaler and inhale daily.        . traMADol (ULTRAM) 50 MG tablet Take 50 mg by mouth every 6 (six) hours as needed.         Review of Systems unchanged:   Physical Exam lungs are  clear to auscultation percussion   Diagnostic Tests: CT scan showed no evidence of recurrence of her cancer   Impression: Status post left upper lobectomy for stage IIa non-small cell lung cancer   Plan: Followup Dr. Gwenyth Bouillon with CT scan in 6 months

## 2012-07-16 ENCOUNTER — Other Ambulatory Visit: Payer: Self-pay | Admitting: *Deleted

## 2012-07-16 DIAGNOSIS — C3490 Malignant neoplasm of unspecified part of unspecified bronchus or lung: Secondary | ICD-10-CM | POA: Insufficient documentation

## 2012-07-17 ENCOUNTER — Telehealth: Payer: Self-pay | Admitting: Internal Medicine

## 2012-07-17 NOTE — Telephone Encounter (Signed)
8/7 pof there were no orders in for ct and or labs,note to dana to order,pof closed    aom

## 2012-12-16 HISTORY — PX: LUNG REMOVAL, PARTIAL: SHX233

## 2012-12-22 ENCOUNTER — Other Ambulatory Visit: Payer: Self-pay | Admitting: Internal Medicine

## 2012-12-22 ENCOUNTER — Other Ambulatory Visit (HOSPITAL_COMMUNITY): Payer: Self-pay | Admitting: Internal Medicine

## 2012-12-22 DIAGNOSIS — C3490 Malignant neoplasm of unspecified part of unspecified bronchus or lung: Secondary | ICD-10-CM

## 2012-12-22 DIAGNOSIS — N644 Mastodynia: Secondary | ICD-10-CM

## 2012-12-24 ENCOUNTER — Ambulatory Visit (HOSPITAL_COMMUNITY)
Admission: RE | Admit: 2012-12-24 | Discharge: 2012-12-24 | Payer: Medicare Other | Source: Ambulatory Visit | Attending: Internal Medicine | Admitting: Internal Medicine

## 2012-12-31 ENCOUNTER — Ambulatory Visit
Admission: RE | Admit: 2012-12-31 | Discharge: 2012-12-31 | Disposition: A | Discharge status: Home / Self Care | Payer: Medicare Other | Source: Ambulatory Visit | Attending: Internal Medicine | Admitting: Internal Medicine

## 2012-12-31 DIAGNOSIS — N644 Mastodynia: Secondary | ICD-10-CM

## 2013-01-07 ENCOUNTER — Other Ambulatory Visit (HOSPITAL_COMMUNITY): Payer: Medicare Other

## 2013-01-08 ENCOUNTER — Ambulatory Visit (HOSPITAL_COMMUNITY)
Admission: RE | Admit: 2013-01-08 | Discharge: 2013-01-08 | Payer: Medicare Other | Source: Ambulatory Visit | Attending: Internal Medicine | Admitting: Internal Medicine

## 2013-01-12 ENCOUNTER — Ambulatory Visit (HOSPITAL_COMMUNITY)
Admission: RE | Admit: 2013-01-12 | Discharge: 2013-01-12 | Disposition: A | Discharge status: Home / Self Care | Payer: Medicare Other | Source: Ambulatory Visit | Attending: Internal Medicine | Admitting: Internal Medicine

## 2013-01-12 DIAGNOSIS — I7090 Unspecified atherosclerosis: Secondary | ICD-10-CM | POA: Insufficient documentation

## 2013-01-12 DIAGNOSIS — R059 Cough, unspecified: Secondary | ICD-10-CM | POA: Insufficient documentation

## 2013-01-12 DIAGNOSIS — C3490 Malignant neoplasm of unspecified part of unspecified bronchus or lung: Secondary | ICD-10-CM

## 2013-01-12 DIAGNOSIS — R05 Cough: Secondary | ICD-10-CM | POA: Insufficient documentation

## 2013-01-12 DIAGNOSIS — R911 Solitary pulmonary nodule: Secondary | ICD-10-CM | POA: Insufficient documentation

## 2013-01-12 MED ORDER — IOHEXOL 300 MG/ML  SOLN
80.0000 mL | Freq: Once | INTRAMUSCULAR | Status: AC | PRN
  Administered 2013-01-12: 80 mL via INTRAVENOUS

## 2013-06-01 ENCOUNTER — Emergency Department (HOSPITAL_COMMUNITY)
Admission: EM | Admit: 2013-06-01 | Discharge: 2013-06-01 | Disposition: A | Discharge status: Home / Self Care | Payer: Medicare Other | Attending: Emergency Medicine | Admitting: Emergency Medicine

## 2013-06-01 ENCOUNTER — Encounter (HOSPITAL_COMMUNITY): Payer: Self-pay | Admitting: Emergency Medicine

## 2013-06-01 ENCOUNTER — Emergency Department (HOSPITAL_COMMUNITY): Payer: Medicare Other

## 2013-06-01 DIAGNOSIS — Z8669 Personal history of other diseases of the nervous system and sense organs: Secondary | ICD-10-CM | POA: Insufficient documentation

## 2013-06-01 DIAGNOSIS — G4710 Hypersomnia, unspecified: Secondary | ICD-10-CM

## 2013-06-01 DIAGNOSIS — G4700 Insomnia, unspecified: Secondary | ICD-10-CM | POA: Insufficient documentation

## 2013-06-01 DIAGNOSIS — IMO0002 Reserved for concepts with insufficient information to code with codable children: Secondary | ICD-10-CM | POA: Insufficient documentation

## 2013-06-01 DIAGNOSIS — R079 Chest pain, unspecified: Secondary | ICD-10-CM | POA: Insufficient documentation

## 2013-06-01 DIAGNOSIS — Z9981 Dependence on supplemental oxygen: Secondary | ICD-10-CM | POA: Insufficient documentation

## 2013-06-01 DIAGNOSIS — Z7982 Long term (current) use of aspirin: Secondary | ICD-10-CM | POA: Insufficient documentation

## 2013-06-01 DIAGNOSIS — J4489 Other specified chronic obstructive pulmonary disease: Secondary | ICD-10-CM | POA: Insufficient documentation

## 2013-06-01 DIAGNOSIS — Z8619 Personal history of other infectious and parasitic diseases: Secondary | ICD-10-CM | POA: Insufficient documentation

## 2013-06-01 DIAGNOSIS — R0602 Shortness of breath: Secondary | ICD-10-CM | POA: Insufficient documentation

## 2013-06-01 DIAGNOSIS — M19049 Primary osteoarthritis, unspecified hand: Secondary | ICD-10-CM | POA: Insufficient documentation

## 2013-06-01 DIAGNOSIS — J449 Chronic obstructive pulmonary disease, unspecified: Secondary | ICD-10-CM | POA: Insufficient documentation

## 2013-06-01 DIAGNOSIS — R5383 Other fatigue: Secondary | ICD-10-CM | POA: Insufficient documentation

## 2013-06-01 DIAGNOSIS — F17200 Nicotine dependence, unspecified, uncomplicated: Secondary | ICD-10-CM | POA: Insufficient documentation

## 2013-06-01 DIAGNOSIS — I10 Essential (primary) hypertension: Secondary | ICD-10-CM | POA: Insufficient documentation

## 2013-06-01 DIAGNOSIS — Z79899 Other long term (current) drug therapy: Secondary | ICD-10-CM | POA: Insufficient documentation

## 2013-06-01 DIAGNOSIS — Z85118 Personal history of other malignant neoplasm of bronchus and lung: Secondary | ICD-10-CM | POA: Insufficient documentation

## 2013-06-01 DIAGNOSIS — R5381 Other malaise: Secondary | ICD-10-CM | POA: Insufficient documentation

## 2013-06-01 LAB — URINALYSIS, ROUTINE W REFLEX MICROSCOPIC
Hgb urine dipstick: NEGATIVE
Nitrite: NEGATIVE
Specific Gravity, Urine: 1.01 (ref 1.005–1.030)
Urobilinogen, UA: 0.2 mg/dL (ref 0.0–1.0)
pH: 6.5 (ref 5.0–8.0)

## 2013-06-01 LAB — BLOOD GAS, ARTERIAL
Bicarbonate: 27.2 mEq/L — ABNORMAL HIGH (ref 20.0–24.0)
Patient temperature: 98.6
pCO2 arterial: 41.5 mmHg (ref 35.0–45.0)
pH, Arterial: 7.432 (ref 7.350–7.450)
pO2, Arterial: 52 mmHg — ABNORMAL LOW (ref 80.0–100.0)

## 2013-06-01 LAB — BASIC METABOLIC PANEL
CO2: 27 mEq/L (ref 19–32)
Glucose, Bld: 99 mg/dL (ref 70–99)
Potassium: 3.5 mEq/L (ref 3.5–5.1)
Sodium: 135 mEq/L (ref 135–145)

## 2013-06-01 LAB — CBC WITH DIFFERENTIAL/PLATELET
Hemoglobin: 13 g/dL (ref 12.0–15.0)
Lymphocytes Relative: 34 % (ref 12–46)
Lymphs Abs: 2.8 10*3/uL (ref 0.7–4.0)
MCV: 92.4 fL (ref 78.0–100.0)
Neutrophils Relative %: 57 % (ref 43–77)
Platelets: 240 10*3/uL (ref 150–400)
RBC: 4.2 MIL/uL (ref 3.87–5.11)
WBC: 8.3 10*3/uL (ref 4.0–10.5)

## 2013-06-01 MED ORDER — HYDROCODONE-ACETAMINOPHEN 5-325 MG PO TABS
1.0000 | ORAL_TABLET | Freq: Once | ORAL | Status: AC
  Administered 2013-06-01: 1 via ORAL
  Filled 2013-06-01: qty 1

## 2013-06-01 MED ORDER — ONDANSETRON 4 MG PO TBDP
4.0000 mg | ORAL_TABLET | Freq: Once | ORAL | Status: AC
  Administered 2013-06-01: 4 mg via ORAL
  Filled 2013-06-01: qty 1

## 2013-06-01 NOTE — ED Notes (Signed)
Andrea [name], Public librarian calling RT for ABG.

## 2013-06-01 NOTE — ED Provider Notes (Signed)
History    CSN: [id number] Arrival date & time 06/01/13  1522  First MD Initiated Contact with Patient 06/01/13 1700     Chief Complaint  Patient presents with  . sent by pcp   (Consider location/radiation/quality/duration/timing/severity/associated sxs/prior Treatment) HPI  Andrea [name] is a 77 y.o.female with a PMH of hypertension, COPD, lung cancer with lubectomy presents to the ER with complaints of increased sleepiness. She had a repeat chest scan done a few months ago which showed her previous lobectomy without new metastasis. She has been describing some pain of her lower left lung that worsens with a large breath. She wears oxygen at night but yesterday needed it most of the day which is abnormal. She has not been able to go over an hour without having to lay down and sleep. She has been falling asleep while in the middle of an activity. She denies feeling poorly or having other symptoms aside from pain and feeling sleepy. She says that her body is not weak but she simply feels sleepy.   Past Medical History  Diagnosis Date  . Hypertension   . PVD (peripheral vascular disease)   . History of shingles 1985  . Sleep related hypoxia     USES CPAP  . Tobacco abuse   . Complication of anesthesia     confusion after surgery  . COPD (chronic obstructive pulmonary disease)     Emphezma  . Hypoxia, sleep related     Oxygen 2 liters at night, HOB  approx 30-45 degree  . Arthritis     Hands  . DEMENTIA   . Cancer     l lung cancer  . Hearing loss    Past Surgical History  Procedure Laterality Date  . Appendectomy      1957  . Elbow surgery  2008    right elbow  . Tonsillectomy    . Lung biospy     Family History  Problem Relation Age of Onset  . Stroke Mother   . Stroke Father   . Cancer Sister   . Anesthesia problems Neg Hx    History  Substance Use Topics  . Smoking status: Current Some Day Smoker -- 1.00 packs/day for 40 years    Types: Cigarettes  .  Smokeless tobacco: Never Used  . Alcohol Use: Yes     Comment: rarely   OB History   Grav Para Term Preterm Abortions TAB SAB Ect Mult Living                 Review of Systems  Constitutional: Positive for activity change and fatigue. Negative for fever and chills.  HENT: Negative for congestion and neck pain.   Eyes: Negative for pain.  Respiratory: Positive for shortness of breath. Negative for cough.   Cardiovascular: Positive for chest pain.  Gastrointestinal: Negative for vomiting, abdominal pain, diarrhea and constipation.  Genitourinary: Negative for dysuria.  Skin: Negative for wound.  Neurological: Negative for dizziness and weakness.  All other systems reviewed and are negative.    Allergies  Review of patient's allergies indicates no known allergies.  Home Medications   Current Outpatient Rx  Name  Route  Sig  Dispense  Refill  . albuterol (PROVENTIL HFA;VENTOLIN HFA) 108 (90 BASE) MCG/ACT inhaler   Inhalation   Inhale 2 puffs into the lungs every 4 (four) hours as needed for shortness of breath.          Marland Kitchen albuterol (PROVENTIL) (2.5 MG/3ML) 0.083% nebulizer  solution   Nebulization   Take 2.5 mg by nebulization every 4 (four) hours as needed for shortness of breath.          Marland Kitchen alendronate (FOSAMAX) 70 MG tablet   Oral   Take 70 mg by mouth every 7 (seven) days. Sundays         . aspirin EC 81 MG tablet   Oral   Take 81 mg by mouth every morning.          . cholecalciferol (VITAMIN D) 1000 UNITS tablet   Oral   Take 2,000 Units by mouth every morning.          . cyclobenzaprine (FLEXERIL) 5 MG tablet   Oral   Take 5 mg by mouth 3 (three) times daily as needed for muscle spasms.         . Fluticasone-Salmeterol (ADVAIR) 250-50 MCG/DOSE AEPB   Inhalation   Inhale 1 puff into the lungs every 12 (twelve) hours.           Marland Kitchen galantamine (RAZADYNE ER) 16 MG 24 hr capsule   Oral   Take 16 mg by mouth every morning.         Marland Kitchen losartan  (COZAAR) 50 MG tablet   Oral   Take 50 mg by mouth every morning.         . lovastatin (MEVACOR) 40 MG tablet   Oral   Take 40 mg by mouth every morning.          . Memantine HCl ER (NAMENDA XR) 21 MG CP24   Oral   Take 1 capsule by mouth every morning.         . Multiple Vitamin (MULITIVITAMIN WITH MINERALS) TABS   Oral   Take 1 tablet by mouth every morning.          . Omega-3 Fatty Acids (FISH OIL) 1200 MG CAPS   Oral   Take 1 capsule by mouth every morning.          . roflumilast (DALIRESP) 500 MCG TABS tablet   Oral   Take 500 mcg by mouth every morning.          . tiotropium (SPIRIVA) 18 MCG inhalation capsule   Inhalation   Place 18 mcg into inhaler and inhale daily.           BP 133/63  Pulse 89  Temp(Src) 98.1 F (36.7 C) (Oral)  Resp 16  SpO2 91% Physical Exam  Nursing note and vitals reviewed. Constitutional: She appears well-developed and well-nourished. No distress.  HENT:  Head: Normocephalic and atraumatic.  Eyes: Pupils are equal, round, and reactive to light.  Neck: Normal range of motion. Neck supple.  Cardiovascular: Normal rate and regular rhythm.   Pulmonary/Chest: Effort normal.  Abdominal: Soft.  Neurological: She is alert.  Skin: Skin is warm and dry.    ED Course  Procedures (including critical care time) Labs Reviewed  BLOOD GAS, ARTERIAL - Abnormal; Notable for the following:    pO2, Arterial 52.0 (*)    Bicarbonate 27.2 (*)    Acid-Base Excess 3.1 (*)    All other components within normal limits  URINALYSIS, ROUTINE W REFLEX MICROSCOPIC  CBC WITH DIFFERENTIAL  BASIC METABOLIC PANEL   Dg Chest 2 View  06/01/2013   *RADIOLOGY REPORT*  Clinical Data: Left chest pain  CHEST - 2 VIEW  Comparison: CT chest 01/12/2013.  Chest x-ray 02/12/2012  Findings: COPD with scarring and atelectasis in the lung  bases. Right middle lobe and right lower lobe atelectasis have progressed since the prior study.  No definite pneumonia.   Negative for heart failure or effusion.  Surgical clips in the region of the aortopulmonary window.  No mass or adenopathy is detected.  IMPRESSION: COPD with scarring and atelectasis in the bases.  Negative for pneumonia or heart failure.   Original Report Authenticated By: Janeece Riggers, M.D.   1. Excessive sleepiness     MDM  Discussed case with Dr. Radford Pax. Will check abg, basic labs and chest xray.  Lab work, including abg, physiologic. Chest xray showed no pneumonia or concerning lesions. No cause found to explain patient hypersomnia. She admits to not sleeping that well through the night and history of lseep apnea. Not wearing bipap at bedtime.  Recommend repeat sleep study. Will refer to [facility].  76 y.o.Cali P Jaskolski's evaluation in the Emergency Department is complete. It has been determined that no acute conditions requiring further emergency intervention are present at this time. The patient/guardian have been advised of the diagnosis and plan. We have discussed signs and symptoms that warrant return to the ED, such as changes or worsening in symptoms.  Vital signs are stable at discharge. Filed Vitals:   06/01/13 1809  BP: 133/63  Pulse: 89  Temp: 98.1 F (36.7 C)  Resp: 16    Patient/guardian has voiced understanding and agreed to follow-up with the PCP or specialist.   Dorthula Matas, PA-C 06/01/13 1903

## 2013-06-01 NOTE — ED Notes (Addendum)
Pt presenting to ed with c/o sent by her pcp because she is having problems staying awake per sister this has been going on x 1 month or more. Pt is also having left side pain where she had previous lung surgery x 1 1/2 years ago. Pt denies chest pain, nausea and vomiting

## 2013-06-04 NOTE — ED Provider Notes (Signed)
Medical screening examination/treatment/procedure(s) were performed by non-physician practitioner and as supervising physician I was immediately available for consultation/collaboration.   Hema Lanza L Treasure Ingrum, MD 06/04/13 1931 

## 2013-06-15 ENCOUNTER — Other Ambulatory Visit: Payer: Self-pay | Admitting: Internal Medicine

## 2013-06-17 ENCOUNTER — Other Ambulatory Visit: Payer: Medicare Other

## 2013-06-22 ENCOUNTER — Other Ambulatory Visit: Payer: Medicare Other

## 2013-06-23 ENCOUNTER — Ambulatory Visit
Admission: RE | Admit: 2013-06-23 | Discharge: 2013-06-23 | Disposition: A | Discharge status: Home / Self Care | Payer: Medicare Other | Source: Ambulatory Visit | Attending: Internal Medicine | Admitting: Internal Medicine

## 2013-06-23 ENCOUNTER — Institutional Professional Consult (permissible substitution): Payer: Medicare Other | Admitting: Internal Medicine

## 2013-06-23 MED ORDER — IOHEXOL 300 MG/ML  SOLN
75.0000 mL | Freq: Once | INTRAMUSCULAR | Status: AC | PRN
  Administered 2013-06-23: 75 mL via INTRAVENOUS

## 2013-06-30 ENCOUNTER — Encounter: Payer: Self-pay | Admitting: Internal Medicine

## 2013-06-30 ENCOUNTER — Ambulatory Visit (INDEPENDENT_AMBULATORY_CARE_PROVIDER_SITE_OTHER): Payer: Medicare Other | Admitting: Internal Medicine

## 2013-06-30 VITALS — BP 104/60 | HR 90 | Temp 97.2°F | Ht 60.0 in | Wt 158.8 lb

## 2013-06-30 DIAGNOSIS — F17200 Nicotine dependence, unspecified, uncomplicated: Secondary | ICD-10-CM

## 2013-06-30 DIAGNOSIS — C3490 Malignant neoplasm of unspecified part of unspecified bronchus or lung: Secondary | ICD-10-CM

## 2013-06-30 DIAGNOSIS — J449 Chronic obstructive pulmonary disease, unspecified: Secondary | ICD-10-CM

## 2013-06-30 DIAGNOSIS — G4734 Idiopathic sleep related nonobstructive alveolar hypoventilation: Secondary | ICD-10-CM

## 2013-06-30 NOTE — Patient Instructions (Addendum)
The key is to stop smoking completely before smoking completely stops you!   If you feel your breathing is limiting you from a desired activity use the 02 at 2lpm and attempt to do it and monitor your sats above 90%  Please schedule a follow up office visit in 6 weeks, call sooner if needed with pft's  Late add : revisit the issue of who is following her cpap/noct 02 issues and whether mohammed actively following

## 2013-06-30 NOTE — Progress Notes (Signed)
  Subjective:    Patient ID: Andrea [name], female    DOB: [birth year]   MRN: [id number]  HPI  81 yowf smoker referred to pulmonary clinic 7/22/114 by Dr Waynard Edwards for unexplained hypoxemia and hypersomnolence  06/30/2013 1st pulmonary ov cc L chest pain mid axillary around C3-6  ever since surgery for lung ca limited by fatigue  > sob on advair, spiriva, on saba hfa/ neb less than once a month using 02 2lpm noct only plus prn with no am cough/ congestion puts 02 on in am when ready to take it easy but not actually with exertion which she says is not really limited by sob, though she is quite sedentary able to walk flat and slow "anywhere I want"    No obvious daytime variabilty or assoc excess or purulent sputum or cp or chest tightness, subjective wheeze overt sinus or hb symptoms. No unusual exp hx or h/o childhood pna/ asthma or knowledge of premature birth.   Sleeping ok on 02 without nocturnal  or early am exacerbation  of respiratory  c/o's or need for noct saba. Also denies any obvious fluctuation of symptoms with weather or environmental changes or other aggravating or alleviating factors except as outlined above      Review of Systems  Constitutional: Negative for fever, chills and unexpected weight change.  HENT: Positive for congestion. Negative for ear pain, nosebleeds, sore throat, rhinorrhea, sneezing, trouble swallowing, dental problem, voice change, postnasal drip and sinus pressure.   Eyes: Negative for visual disturbance.  Respiratory: Positive for cough and shortness of breath. Negative for choking.   Cardiovascular: Negative for chest pain and leg swelling.  Gastrointestinal: Negative for vomiting, abdominal pain and diarrhea.  Genitourinary: Negative for difficulty urinating.  Musculoskeletal: Positive for arthralgias.  Skin: Negative for rash.  Neurological: Negative for tremors, syncope and headaches.  Hematological: Does not bruise/bleed easily.       Objective:    Physical Exam  amb wf Patient failed to answer a single question asked in a straightforward manner, tending to go off on tangents or answer questions with ambiguous medical terms or diagnoses and seemed aggravated  when asked the same question more than once for clarification.   Wt Readings from Last 3 Encounters:  06/30/13 158 lb 12.8 oz (72.031 kg)  06/04/12 142 lb (64.411 kg)  02/12/12 143 lb (64.864 kg)     HEENT mild turbinate edema.  Oropharynx no thrush or excess pnd or cobblestoning.  No JVD or cervical adenopathy. Mild accessory muscle hypertrophy. Trachea midline, nl thryroid. Chest was hyperinflated by percussion with diminished breath sounds and moderate increased exp time without wheeze. Hoover sign positive at mid inspiration. Regular rate and rhythm without murmur gallop or rub or increase P2 or edema.  Abd: no hsm, nl excursion. Ext warm without cyanosis or clubbing.    CT 06/23/13 Postsurgical changes left hemithorax with slightly increased  pleural thickening at the lateral aspect of mid chest.  New sclerotic focus in the lateral left fifth rib with cortical  destruction along the inferior margin worrisome for a metastatic  lesion.  This is immediately cranial to the area of pleural thickening with  an additional area of thickening of the soft tissues in the  intercostal space between the lateral left fifth and sixth ribs,  potentially postsurgical change due to presence of fat centrally  though an chest wall metastatic lesion is not completely excluded.       Assessment & Plan:

## 2013-07-01 NOTE — Assessment & Plan Note (Addendum)
CT chest 06/23/13 Postsurgical changes left hemithorax with slightly increased  pleural thickening at the lateral aspect of mid chest.  New sclerotic focus in the lateral left fifth rib with cortical  destruction along the inferior margin worrisome for a metastatic  lesion.  This is immediately cranial to the area of pleural thickening with  an additional area of thickening of the soft tissues in the  intercostal space between the lateral left fifth and sixth ribs,  potentially postsurgical change due to presence of fat centrally  though an chest wall metastatic lesion is not completely excluded.  Additionally, new 5 mm right lower lobe pulmonary nodule cannot  exclude metastasis.  Previously questioned nodular foci in bilateral upper lobes no  longer identified.   Strongly suspect she has recurrent or met dz involving L chest wall/ rib > eval and rx planned by [facility] > if needs tissue dx then IR directed FNA best option, not fob > will advise Dr Arbutus Ped

## 2013-07-02 ENCOUNTER — Other Ambulatory Visit: Payer: Self-pay | Admitting: *Deleted

## 2013-07-02 ENCOUNTER — Encounter: Payer: Self-pay | Admitting: *Deleted

## 2013-07-02 ENCOUNTER — Telehealth: Payer: Self-pay | Admitting: Internal Medicine

## 2013-07-02 NOTE — Assessment & Plan Note (Signed)

## 2013-07-02 NOTE — Telephone Encounter (Signed)
REFERRAL FORWARDED TO DANA HERNDON.

## 2013-07-02 NOTE — Assessment & Plan Note (Addendum)
My notes indicate she is already on cpap and if so needs to have this re-titrated and determine whether and how much 02 is needed to maintain sats > 90 but if she refuses to wear cpap or repeat the study it then the best we can do is maintain the sats in low 90's to prevent excessively driving down her drive to breath.  We did not apparently do the initial eval for cpap or 02 but would be happy to refer her to one of our sleep docs for this at the discretion of Dr Waynard Edwards.

## 2013-07-02 NOTE — Assessment & Plan Note (Addendum)
-   Spirometry 11/29/11  FEV1  1.20 (68%) ratio 63 and DLCO54% corrects to 71  - 06/30/2013   Walked RA x one lap @ 185 stopped due to  Sob, no desat    DDX of  difficult airways managment all start with A and  include Adherence, Ace Inhibitors, Acid Reflux, Active Sinus Disease, Alpha 1 Antitripsin deficiency, Anxiety masquerading as Airways dz,  ABPA,  allergy(esp in young), Aspiration (esp in elderly), Adverse effects of DPI,  Active smokers, plus two Bs  = Bronchiectasis and Beta blocker use..and one C= CHF  Adherence is always the initial "prime suspect" and is a multilayered concern that requires a "trust but verify" approach in every patient - starting with knowing how to use medications, especially inhalers, correctly, keeping up with refills and understanding the fundamental difference between maintenance and prns vs those medications only taken for a very short course and then stopped and not refilled.     Active smoking obviously the greatest concern here > discussed separately  See instructions for specific recommendations which were reviewed directly with the patient who was given a copy with highlighter outlining the key components.

## 2013-07-03 ENCOUNTER — Telehealth: Payer: Self-pay | Admitting: Internal Medicine

## 2013-07-03 NOTE — Telephone Encounter (Signed)
s.w. pt and advised on 2week appt...she did not want to sched until she s/w her ride/sister

## 2013-07-03 NOTE — Telephone Encounter (Signed)
pt sister called back and i sched appt 8.6.14...ok and aware

## 2013-07-15 ENCOUNTER — Encounter: Payer: Self-pay | Admitting: Internal Medicine

## 2013-07-15 ENCOUNTER — Telehealth: Payer: Self-pay | Admitting: Internal Medicine

## 2013-07-15 ENCOUNTER — Other Ambulatory Visit (HOSPITAL_BASED_OUTPATIENT_CLINIC_OR_DEPARTMENT_OTHER): Payer: Medicare Other | Admitting: Lab

## 2013-07-15 ENCOUNTER — Ambulatory Visit (HOSPITAL_BASED_OUTPATIENT_CLINIC_OR_DEPARTMENT_OTHER): Payer: Medicare Other | Admitting: Internal Medicine

## 2013-07-15 DIAGNOSIS — C3490 Malignant neoplasm of unspecified part of unspecified bronchus or lung: Secondary | ICD-10-CM

## 2013-07-15 DIAGNOSIS — F17200 Nicotine dependence, unspecified, uncomplicated: Secondary | ICD-10-CM

## 2013-07-15 LAB — BASIC METABOLIC PANEL (CC13)
BUN: 16.9 mg/dL (ref 7.0–26.0)
Chloride: 104 mEq/L (ref 98–109)
Potassium: 3.8 mEq/L (ref 3.5–5.1)

## 2013-07-15 LAB — CBC WITH DIFFERENTIAL/PLATELET
Basophils Absolute: 0.1 10*3/uL (ref 0.0–0.1)
EOS%: 1.6 % (ref 0.0–7.0)
HGB: 12.5 g/dL (ref 11.6–15.9)
MCH: 31.9 pg (ref 25.1–34.0)
NEUT#: 6.2 10*3/uL (ref 1.5–6.5)
RDW: 14.2 % (ref 11.2–14.5)
lymph#: 3.1 10*3/uL (ref 0.9–3.3)

## 2013-07-15 NOTE — Patient Instructions (Signed)
CURRENT THERAPY: None.  CHEMOTHERAPY INTENT: N/A  CURRENT # OF CHEMOTHERAPY CYCLES: 0  CURRENT ANTIEMETICS: N/A  CURRENT SMOKING STATUS: Current smoker. Not willing to quit. Given counseling  ORAL CHEMOTHERAPY AND CONSENT: None  CURRENT BISPHOSPHONATES USE: None  PAIN MANAGEMENT: 3/10 left chest wall pain  NARCOTICS INDUCED CONSTIPATION: None  LIVING WILL AND CODE STATUS: Full code initially but not for long term resuscitation.     

## 2013-07-15 NOTE — Progress Notes (Signed)
[facility] Telephone:([phone]) [phone]   [phone]) [phone]  OFFICE PROGRESS NOTE  Andrea Kayser, MD [address] [place] [address]  DIAGNOSIS: stage IIB (T3, N0, M0) non-small cell lung cancer consistent with invasive squamous cell carcinoma diagnosed in December of 2012.   PRIOR THERAPY: Status post left upper lobectomy with lymph node dissection on 12/17/2011 under the care of Dr. Edwyna Shell.  CURRENT THERAPY: None.  CHEMOTHERAPY INTENT: N/A  CURRENT # OF CHEMOTHERAPY CYCLES: 0  CURRENT ANTIEMETICS: N/A  CURRENT SMOKING STATUS: Current smoker. Not willing to quit. Given counseling  ORAL CHEMOTHERAPY AND CONSENT: None  CURRENT BISPHOSPHONATES USE: None  PAIN MANAGEMENT: 3/10 left chest wall pain  NARCOTICS INDUCED CONSTIPATION: None  LIVING WILL AND CODE STATUS: Full code initially but not for long term resuscitation.  INTERVAL HISTORY: Andrea [name] 77 y.o. female returns to the clinic today for followup visit accompanied by her sister. The patient was seen only 1 time more than 18 months ago when she was initially diagnosed with a stage IIB. I recommended for her at that time adjuvant chemotherapy but the patient declined the treatment at that time and did not follow up for routine scanning. Over the last few months she has been complaining of increasing pain on the left side of her chest as well as shortness of breath. She was seen by her primary care physician Dr. Waynard Edwards and CT scan of the chest was performed on 06/23/2013. It showed Postsurgical changes left hemithorax with slightly increased pleural thickening at the lateral aspect of mid chest. New sclerotic focus in the lateral left fifth rib with cortical destruction along the inferior margin worrisome for a metastatic lesion. This is immediately cranial to the area of pleural thickening with an additional area of thickening of the soft tissues in the intercostal space between the lateral left fifth  and sixth ribs, potentially postsurgical change due to presence of fat centrally though an chest wall metastatic lesion is not completely excluded. Additionally, new 5 mm right lower lobe pulmonary nodule cannot exclude metastasis. She was referred to me today for evaluation and recommendation regarding the abnormal finding on her CT scan.  The patient is feeling fine today except for the pain on the left side of her chest and shortness breath with exertion. Unfortunately she continues to smoke and has no intention to quit smoking. She denied having any significant weight loss or night sweats.   MEDICAL HISTORY: Past Medical History  Diagnosis Date  . Hypertension   . PVD (peripheral vascular disease)   . History of shingles 1985  . Sleep related hypoxia     USES CPAP  . Tobacco abuse   . Complication of anesthesia     confusion after surgery  . COPD (chronic obstructive pulmonary disease)     Emphezma  . Hypoxia, sleep related     Oxygen 2 liters at night, HOB  approx 30-45 degree  . Arthritis     Hands  . DEMENTIA   . Cancer     l lung cancer  . Hearing loss     ALLERGIES:  has No Known Allergies.  MEDICATIONS:  Current Outpatient Prescriptions  Medication Sig Dispense Refill  . acetaminophen (TYLENOL) 500 MG tablet Take 500 mg by mouth 2 (two) times daily.      Marland Kitchen albuterol (PROVENTIL HFA;VENTOLIN HFA) 108 (90 BASE) MCG/ACT inhaler Inhale 2 puffs into the lungs every 4 (four) hours as needed for shortness of breath.       Marland Kitchen  albuterol (PROVENTIL) (2.5 MG/3ML) 0.083% nebulizer solution Take 2.5 mg by nebulization every 4 (four) hours as needed for shortness of breath.       Marland Kitchen alendronate (FOSAMAX) 70 MG tablet Take 70 mg by mouth every 7 (seven) days. Sundays      . aspirin EC 81 MG tablet Take 81 mg by mouth every morning.       . cholecalciferol (VITAMIN D) 1000 UNITS tablet Take 2,000 Units by mouth every morning.       . cyclobenzaprine (FLEXERIL) 5 MG tablet Take 5 mg by  mouth 3 (three) times daily as needed for muscle spasms.      . Fluticasone-Salmeterol (ADVAIR) 250-50 MCG/DOSE AEPB Inhale 1 puff into the lungs every 12 (twelve) hours.        . galantamine (RAZADYNE ER) 16 MG 24 hr capsule Take 16 mg by mouth every morning.      . losartan (COZAAR) 50 MG tablet Take 50 mg by mouth every morning.      . lovastatin (MEVACOR) 40 MG tablet Take 40 mg by mouth every morning.       . Memantine HCl ER (NAMENDA XR) 21 MG CP24 Take 1 capsule by mouth every morning.      . Multiple Vitamin (MULITIVITAMIN WITH MINERALS) TABS Take 1 tablet by mouth every morning.       . Omega-3 Fatty Acids (FISH OIL) 1200 MG CAPS Take 1 capsule by mouth every morning.       . roflumilast (DALIRESP) 500 MCG TABS tablet Take 500 mcg by mouth every morning.       . tiotropium (SPIRIVA) 18 MCG inhalation capsule Place 18 mcg into inhaler and inhale daily.        No current facility-administered medications for this visit.    SURGICAL HISTORY:  Past Surgical History  Procedure Laterality Date  . Appendectomy      19 59  . Elbow surgery  2008    right elbow  . Tonsillectomy    . Lung biospy      REVIEW OF SYSTEMS:  A comprehensive review of systems was negative except for: Constitutional: positive for fatigue Respiratory: positive for dyspnea on exertion and pleurisy/chest pain   PHYSICAL EXAMINATION: General appearance: alert, cooperative and no distress Head: Normocephalic, without obvious abnormality, atraumatic Neck: no adenopathy Lymph nodes: Cervical, supraclavicular, and axillary nodes normal. Resp: clear to auscultation bilaterally Cardio: regular rate and rhythm, S1, S2 normal, no murmur, click, rub or gallop GI: soft, non-tender; bowel sounds normal; no masses,  no organomegaly Extremities: extremities normal, atraumatic, no cyanosis or edema Neurologic: Alert and oriented X 3, normal strength and tone. Normal symmetric reflexes. Normal coordination and gait  ECOG  PERFORMANCE STATUS: 1 - Symptomatic but completely ambulatory  Blood pressure 148/67, pulse 94, temperature 97.2 F (36.2 C), temperature source Oral, resp. rate 18, height 5' (1.524 m), weight 159 lb 9.6 oz (72.394 kg).  LABORATORY DATA: Lab Results  Component Value Date   WBC 10.3 07/15/2013   HGB 12.5 07/15/2013   HCT 36.9 07/15/2013   MCV 94.1 07/15/2013   PLT 235 07/15/2013      Chemistry      Component Value Date/Time   NA 137 07/15/2013 1252   NA 135 06/01/2013 1750   K 3.8 07/15/2013 1252   K 3.5 06/01/2013 1750   CL 98 06/01/2013 1750   CO2 24 07/15/2013 1252   CO2 27 06/01/2013 1750   BUN 16.9 07/15/2013 1252  BUN 10 06/01/2013 1750   CREATININE 0.7 07/15/2013 1252   CREATININE 0.51 06/01/2013 1750   CREATININE 0.76 11/28/2011 1220      Component Value Date/Time   CALCIUM 9.8 07/15/2013 1252   CALCIUM 9.9 06/01/2013 1750   ALKPHOS 58 01/07/2012 1349   AST 14 01/07/2012 1349   ALT 9 01/07/2012 1349   BILITOT 0.5 01/07/2012 1349       RADIOGRAPHIC STUDIES: Ct Chest W Contrast  06/23/2013   *RADIOLOGY REPORT*  Clinical Data:  Lung cancer post left lung resection, history smoking, hypertension, COPD  CT CHEST WITH CONTRAST  Technique:  Multidetector CT imaging of the chest was performed following the standard protocol during bolus administration of intravenous contrast. Sagittal and coronal MPR images reconstructed from axial data set.  Contrast: 75mL OMNIPAQUE IOHEXOL 300 MG/ML  SOLN  Comparison: 01/12/2013  Findings: Scattered atherosclerotic calcifications aorta, great vessels and coronary arteries. Visualized portion of upper abdomen unremarkable. 9 mm short axis pretracheal lymph node image 21. No enlarged thoracic nodes. Small calcified nodes subcarinal and at left hilum. 5 mm right lower lobe nodule centrally image 36, new.  Previously questioned 4 mm nodular density laterally at right upper lobe and 6 mm diameter nodular density in left upper lobe are no longer seen. Peripheral scarring in  lateral left hemithorax at site of prior surgery is again identified, with slightly increased pleural thickening at site versus previous exam. Between the lateral aspects of the left fifth and sixth ribs however an area of slight soft tissue thickening is identified, fat attenuation centrally, question related to prior surgery, chest fall metastasis not entirely excluded. No acute infiltrate, pleural effusion, pneumothorax or additional mass/nodule.  Focal sclerosis identified at the lateral aspect of the left fifth rib with an area of cortical destruction along the inferior margin best appreciated coronal images 58 - 60, immediately superior to the area of the lateral chest wall thickening. No additional significant osseous abnormalities.  IMPRESSION: Postsurgical changes left hemithorax with slightly increased pleural thickening at the lateral aspect of mid chest. New sclerotic focus in the lateral left fifth rib with cortical destruction along the inferior margin worrisome for a metastatic lesion. This is immediately cranial to the area of pleural thickening with an additional area of thickening of the soft tissues in the intercostal space between the lateral left fifth and sixth ribs, potentially postsurgical change due to presence of fat centrally though an chest wall metastatic lesion is not completely excluded.  Additionally, new 5 mm right lower lobe pulmonary nodule cannot exclude metastasis. Previously questioned nodular foci in bilateral upper lobes no longer identified. No thoracic adenopathy or acute infiltrate.a   Original Report Authenticated By: Ulyses Southward, M.D.    ASSESSMENT AND PLAN: This is a very pleasant 77 years old white female with history of stage IIB non-small cell lung cancer status post left upper lobectomy and has been observation for the last 2 years but unfortunately the patient has evidence for disease recurrence in the left side of the chest. I have a lengthy discussion with the  patient and her sister today about her current disease status and treatment options. I recommended for the patient to have a PET scan as well as CT scan of the head. I would also referred the patient to interventional radiology for consideration of CT-guided core biopsy of the left chest wall mass. I would see her back for followup visit in 2 weeks for evaluation and discussion of her scans and biopsy results  as well as recommendation regarding treatment of her condition. She was advised to call immediately if she has any concerning symptoms in the interval.  The patient voices understanding of current disease status and treatment options and is in agreement with the current care plan.  All questions were answered. The patient knows to call the clinic with any problems, questions or concerns. We can certainly see the patient much sooner if necessary.  I spent 15 minutes counseling the patient face to face. The total time spent in the appointment was 25 minutes.

## 2013-07-15 NOTE — Telephone Encounter (Signed)
gv and printed appt sched and avs for pt  °

## 2013-07-20 ENCOUNTER — Ambulatory Visit (HOSPITAL_COMMUNITY)
Admission: RE | Admit: 2013-07-20 | Discharge: 2013-07-20 | Disposition: A | Discharge status: Home / Self Care | Payer: Medicare Other | Source: Ambulatory Visit | Attending: Internal Medicine | Admitting: Internal Medicine

## 2013-07-20 ENCOUNTER — Encounter (HOSPITAL_COMMUNITY)
Admission: RE | Admit: 2013-07-20 | Discharge: 2013-07-20 | Disposition: A | Discharge status: Home / Self Care | Payer: Medicare Other | Source: Ambulatory Visit | Attending: Internal Medicine | Admitting: Internal Medicine

## 2013-07-20 ENCOUNTER — Other Ambulatory Visit (HOSPITAL_COMMUNITY): Payer: Medicare Other

## 2013-07-20 ENCOUNTER — Encounter (HOSPITAL_COMMUNITY): Payer: Self-pay

## 2013-07-20 ENCOUNTER — Other Ambulatory Visit: Payer: Self-pay | Admitting: Internal Medicine

## 2013-07-20 DIAGNOSIS — C3490 Malignant neoplasm of unspecified part of unspecified bronchus or lung: Secondary | ICD-10-CM | POA: Insufficient documentation

## 2013-07-20 MED ORDER — IOHEXOL 300 MG/ML  SOLN
100.0000 mL | Freq: Once | INTRAMUSCULAR | Status: AC | PRN
  Administered 2013-07-20: 100 mL via INTRAVENOUS

## 2013-07-20 MED ORDER — FLUDEOXYGLUCOSE F - 18 (FDG) INJECTION
18.7000 | Freq: Once | INTRAVENOUS | Status: AC | PRN
  Administered 2013-07-20: 18.7 via INTRAVENOUS

## 2013-07-21 ENCOUNTER — Telehealth: Payer: Self-pay | Admitting: Medical Oncology

## 2013-07-21 NOTE — Telephone Encounter (Signed)
BX on hold per Dr Worthy Keeler until after PET reviewed by Dr Arbutus Ped.  Does Dr Arbutus Ped want BX?

## 2013-07-24 ENCOUNTER — Telehealth: Payer: Self-pay | Admitting: Medical Oncology

## 2013-07-24 NOTE — Telephone Encounter (Signed)
I spoke to Brett Canales in radiology and told him Dr Arbutus Ped still wants Dr Worthy Keeler to do biopsy

## 2013-07-24 NOTE — Telephone Encounter (Signed)
Message copied by Charma Igo on Fri Jul 24, 2013 10:52 AM ------      Message from: [name]      Created: Tue Jul 21, 2013  8:23 PM       Yes      ----- Message -----         From: Charma Igo, RN         Sent: 07/21/2013   1:56 PM           To: [name], MD            Do you want Dr Worthy Keeler to still do biopsy-PET done monday       ------

## 2013-07-29 ENCOUNTER — Ambulatory Visit (HOSPITAL_BASED_OUTPATIENT_CLINIC_OR_DEPARTMENT_OTHER): Payer: Medicare Other | Admitting: Internal Medicine

## 2013-07-29 ENCOUNTER — Encounter: Payer: Self-pay | Admitting: Internal Medicine

## 2013-07-29 ENCOUNTER — Telehealth: Payer: Self-pay | Admitting: Internal Medicine

## 2013-07-29 ENCOUNTER — Other Ambulatory Visit (HOSPITAL_BASED_OUTPATIENT_CLINIC_OR_DEPARTMENT_OTHER): Payer: Medicare Other | Admitting: Lab

## 2013-07-29 DIAGNOSIS — F17200 Nicotine dependence, unspecified, uncomplicated: Secondary | ICD-10-CM

## 2013-07-29 DIAGNOSIS — C3490 Malignant neoplasm of unspecified part of unspecified bronchus or lung: Secondary | ICD-10-CM

## 2013-07-29 LAB — CBC WITH DIFFERENTIAL/PLATELET
Basophils Absolute: 0.1 10*3/uL (ref 0.0–0.1)
Eosinophils Absolute: 0.1 10*3/uL (ref 0.0–0.5)
HCT: 40 % (ref 34.8–46.6)
HGB: 13.7 g/dL (ref 11.6–15.9)
LYMPH%: 34.1 % (ref 14.0–49.7)
MCHC: 34.1 g/dL (ref 31.5–36.0)
MONO#: 0.6 10*3/uL (ref 0.1–0.9)
NEUT#: 3.8 10*3/uL (ref 1.5–6.5)
NEUT%: 53.9 % (ref 38.4–76.8)
Platelets: 253 10*3/uL (ref 145–400)
WBC: 7 10*3/uL (ref 3.9–10.3)

## 2013-07-29 LAB — COMPREHENSIVE METABOLIC PANEL (CC13)
BUN: 10.6 mg/dL (ref 7.0–26.0)
CO2: 28 mEq/L (ref 22–29)
Calcium: 9.9 mg/dL (ref 8.4–10.4)
Creatinine: 0.7 mg/dL (ref 0.6–1.1)
Glucose: 96 mg/dl (ref 70–140)
Total Bilirubin: 0.62 mg/dL (ref 0.20–1.20)

## 2013-07-29 NOTE — Telephone Encounter (Signed)
gv pt appt schedule for December and appt w/Dr. Roselind Messier for 8/28. Pt/relative aware central will contact pt re ct for December and bx appt.

## 2013-07-29 NOTE — Progress Notes (Signed)
[facility] Telephone:(336) [phone]   [phone]  OFFICE PROGRESS NOTE  [name], MD [address] [place] [address]  DIAGNOSIS: Recurrent non-small cell lung cancer initially diagnosed as stage IIB (T3, N0, M0) non-small cell lung cancer consistent with invasive squamous cell carcinoma diagnosed in December of 2012.   PRIOR THERAPY: Status post left upper lobectomy with lymph node dissection on 12/17/2011 under the care of Dr. Edwyna Shell.   CURRENT THERAPY: None.   CHEMOTHERAPY INTENT: N/A  CURRENT # OF CHEMOTHERAPY CYCLES: 0  CURRENT ANTIEMETICS: N/A  CURRENT SMOKING STATUS: Current smoker. Not willing to quit. Given counseling  ORAL CHEMOTHERAPY AND CONSENT: None  CURRENT BISPHOSPHONATES USE: None  PAIN MANAGEMENT: 3/10 left chest wall pain  NARCOTICS INDUCED CONSTIPATION: None  LIVING WILL AND CODE STATUS: Full code initially but not for long term resuscitation.   INTERVAL HISTORY: Andrea [name] 77 y.o. female returns to the clinic today for followup visit accompanied by her sister. The patient is feeling fine today with no specific complaints except for mild fatigue and shortness breath. She continues to smoke and is not willing to quit smoking. The patient had recent staging workup including CT scan of the head that showed no evidence for disease progression to the brain in addition to a PET scan and she is here for evaluation and discussion of her scan results and recommendation regarding treatment of her disease recurrence.  MEDICAL HISTORY: Past Medical History  Diagnosis Date  . Hypertension   . PVD (peripheral vascular disease)   . History of shingles 1985  . Sleep related hypoxia     USES CPAP  . Tobacco abuse   . Complication of anesthesia     confusion after surgery  . COPD (chronic obstructive pulmonary disease)     Emphezma  . Hypoxia, sleep related     Oxygen 2 liters at night, HOB  approx 30-45 degree  . Arthritis    Hands  . DEMENTIA   . Cancer     l lung cancer  . Hearing loss     ALLERGIES:  has No Known Allergies.  MEDICATIONS:  Current Outpatient Prescriptions  Medication Sig Dispense Refill  . acetaminophen (TYLENOL) 500 MG tablet Take 500 mg by mouth 2 (two) times daily.      Marland Kitchen albuterol (PROVENTIL HFA;VENTOLIN HFA) 108 (90 BASE) MCG/ACT inhaler Inhale 2 puffs into the lungs every 4 (four) hours as needed for shortness of breath.       Marland Kitchen albuterol (PROVENTIL) (2.5 MG/3ML) 0.083% nebulizer solution Take 2.5 mg by nebulization every 4 (four) hours as needed for shortness of breath.       Marland Kitchen alendronate (FOSAMAX) 70 MG tablet Take 70 mg by mouth every 7 (seven) days. Sundays      . aspirin EC 81 MG tablet Take 81 mg by mouth every morning.       . cholecalciferol (VITAMIN D) 1000 UNITS tablet Take 2,000 Units by mouth every morning.       . cyclobenzaprine (FLEXERIL) 5 MG tablet Take 5 mg by mouth 3 (three) times daily as needed for muscle spasms.      . Fluticasone-Salmeterol (ADVAIR) 250-50 MCG/DOSE AEPB Inhale 1 puff into the lungs every 12 (twelve) hours.        Marland Kitchen galantamine (RAZADYNE ER) 16 MG 24 hr capsule Take 16 mg by mouth every morning.      Marland Kitchen losartan (COZAAR) 50 MG tablet Take 50 mg  by mouth every morning.      . lovastatin (MEVACOR) 40 MG tablet Take 40 mg by mouth every morning.       . Memantine HCl ER (NAMENDA XR) 21 MG CP24 Take 1 capsule by mouth every morning.      . Multiple Vitamin (MULITIVITAMIN WITH MINERALS) TABS Take 1 tablet by mouth every morning.       . Omega-3 Fatty Acids (FISH OIL) 1200 MG CAPS Take 1 capsule by mouth every morning.       . roflumilast (DALIRESP) 500 MCG TABS tablet Take 500 mcg by mouth every morning.       . tiotropium (SPIRIVA) 18 MCG inhalation capsule Place 18 mcg into inhaler and inhale daily.        No current facility-administered medications for this visit.    SURGICAL HISTORY:  Past Surgical History  Procedure Laterality Date  .  Appendectomy      1957  . Elbow surgery  2008    right elbow  . Tonsillectomy    . Lung biospy      REVIEW OF SYSTEMS:  A comprehensive review of systems was negative except for: Constitutional: positive for fatigue Respiratory: positive for dyspnea on exertion   PHYSICAL EXAMINATION: General appearance: alert, cooperative, fatigued and no distress Head: Normocephalic, without obvious abnormality, atraumatic Neck: no adenopathy Lymph nodes: Cervical, supraclavicular, and axillary nodes normal. Resp: clear to auscultation bilaterally Cardio: regular rate and rhythm, S1, S2 normal, no murmur, click, rub or gallop GI: soft, non-tender; bowel sounds normal; no masses,  no organomegaly Extremities: extremities normal, atraumatic, no cyanosis or edema  ECOG PERFORMANCE STATUS: 1 - Symptomatic but completely ambulatory  Blood pressure 133/67, pulse 97, temperature 97.5 F (36.4 C), temperature source Oral, resp. rate 20, height 5' (1.524 m), weight 157 lb 12.8 oz (71.578 kg).  LABORATORY DATA: Lab Results  Component Value Date   WBC 7.0 07/29/2013   HGB 13.7 07/29/2013   HCT 40.0 07/29/2013   MCV 93.9 07/29/2013   PLT 253 07/29/2013      Chemistry      Component Value Date/Time   NA 140 07/29/2013 1335   NA 135 06/01/2013 1750   K 3.8 07/29/2013 1335   K 3.5 06/01/2013 1750   CL 98 06/01/2013 1750   CO2 28 07/29/2013 1335   CO2 27 06/01/2013 1750   BUN 10.6 07/29/2013 1335   BUN 10 06/01/2013 1750   CREATININE 0.7 07/29/2013 1335   CREATININE 0.51 06/01/2013 1750   CREATININE 0.76 11/28/2011 1220      Component Value Date/Time   CALCIUM 9.9 07/29/2013 1335   CALCIUM 9.9 06/01/2013 1750   ALKPHOS 43 07/29/2013 1335   ALKPHOS 58 01/07/2012 1349   AST 19 07/29/2013 1335   AST 14 01/07/2012 1349   ALT 17 07/29/2013 1335   ALT 9 01/07/2012 1349   BILITOT 0.62 07/29/2013 1335   BILITOT 0.5 01/07/2012 1349       RADIOGRAPHIC STUDIES: Ct Head W Wo Contrast  07/20/2013   *RADIOLOGY REPORT*   Clinical Data: Intracranial metastatic disease.  CT HEAD WITHOUT AND WITH CONTRAST  Technique:  Contiguous axial images were obtained from the base of the skull through the vertex without and with intravenous contrast.  Contrast: OMNIPAQUE IOHEXOL 300 MG/ML  SOLN  Comparison: 05/05/2009.  11/29/2011.  Findings: Age appropriate atrophy. No mass lesion, mass effect, midline shift, hydrocephalus, hemorrhage.  No territorial ischemia or acute infarction.  Linear bony defect in the  left occipital bone is unchanged compared to the 11/29/2011 and probably represents an old skull fracture.  This was not present on 05/05/2009 comparison.  After contrast administration, there is no abnormal enhancement. Postcontrast images are mildly motion degraded.  IMPRESSION: No interval change.  No acute intracranial abnormality or evidence of metastatic disease.   Original Report Authenticated By: Andreas Newport, M.D.   Nm Pet Image Restag (ps) Skull Base To Thigh  07/20/2013   *RADIOLOGY REPORT*  Clinical Data: Subsequent treatment strategy for lung cancer.  NUCLEAR MEDICINE PET SKULL BASE TO THIGH  Fasting Blood Glucose:  99  Technique:  18.7 mCi F-18 FDG was injected intravenously. CT data was obtained and used for attenuation correction and anatomic localization only.  (This was not acquired as a diagnostic CT examination.) Additional exam technical data entered on technologist worksheet.  Comparison:  CT 06/23/2013, PET CT 11/28/2011  Findings:  Neck: No hypermetabolic lymph nodes in the neck.  Chest:  Focus of nodular pleural thickening along the left side chest wall adjacent to a the focus of bone erosion.  This pleural nodularity  is intensely hypermetabolic with SUV max = 16.0 which is more intense than typically expected of postsurgical inflammation and is  concerning for local recurrence.  No hypermetabolic mediastinal lymph nodes.  There are no hypermetabolic pulmonary nodules.  Abdomen/Pelvis:  No abnormal  hypermetabolic activity within the liver, pancreas, adrenal glands, or spleen.  No hypermetabolic lymph nodes in the abdomen or pelvis.  Skeleton:  No focal hypermetabolic activity to suggest skeletal metastasis.  IMPRESSION:  1.  Hypermetabolic nodular thickening associated with  rib destruction along the left chest wall.  Favor local carcinoma recurrence over  postsurgical inflammation.  Recommend percutaneous biopsy. 2.  No evidence of additional metastasis.   Original Report Authenticated By: Genevive Bi, M.D.    ASSESSMENT AND PLAN: This is a very pleasant 77 years old white female with questionable recurrent non-small cell lung cancer presented with hypermetabolic nodular thickening associated with rib destruction along the left chest wall.  I discussed the scan results with the patient and her sister today. I recommended for her to proceed with a CT-guided core biopsy of the left chest wall mass for tissue diagnosis. I also recommended for the patient to see Dr. Roselind Messier from radiation oncology for consideration of curative radiotherapy to this solitary recurrent lesion. I would see her back for followup visit in 4 months with repeat CT scan of the chest for restaging of her disease. I strongly advise him again to quit smoking and offered her smoke cessation program. The patient was advised to call immediately if she has any concerning symptoms in the interval.  The patient voices understanding of current disease status and treatment options and is in agreement with the current care plan.  All questions were answered. The patient knows to call the clinic with any problems, questions or concerns. We can certainly see the patient much sooner if necessary.  I spent 15 minutes counseling the patient face to face. The total time spent in the appointment was 25 minutes.

## 2013-07-29 NOTE — Patient Instructions (Signed)
CURRENT THERAPY: None.  CHEMOTHERAPY INTENT: N/A  CURRENT # OF CHEMOTHERAPY CYCLES: 0  CURRENT ANTIEMETICS: N/A  CURRENT SMOKING STATUS: Current smoker. Not willing to quit. Given counseling  ORAL CHEMOTHERAPY AND CONSENT: None  CURRENT BISPHOSPHONATES USE: None  PAIN MANAGEMENT: 3/10 left chest wall pain  NARCOTICS INDUCED CONSTIPATION: None  LIVING WILL AND CODE STATUS: Full code initially but not for long term resuscitation.

## 2013-07-31 ENCOUNTER — Encounter: Payer: Self-pay | Admitting: Radiation Oncology

## 2013-07-31 NOTE — Progress Notes (Signed)
Thoracic Location of Tumor / Histology:  Left upper lung  Patient presented 20 months ago with symptoms of: several episodes of questionable pneumonia  Biopsies of left upper lobe (if applicable) revealed:  12/06/11 LUNG, FINE NEEDLE ASPIRATION, LEFT: MALIGNANT CELLS PRESENT, CONSISTENT WITH NON SMALL CELL CARCINOMA. SEE COMMENT. COMMENT: THE SPECIMEN IS LIMITED AND THERE IS INSUFFICIENT MATERIAL FOR FURTHER STUDIES. HOWEVER, THE FEATURES FAVOR SQUAMOUS CELL CARCINOMA IN MY OPINION.   Tobacco/Marijuana/Snuff/ETOH use: still smoking cigarettes w/hx smoking x 40 years, 1.5  PPD  Past/Anticipated interventions by cardiothoracic surgery, if any:   08/05/13 Soft Tissue Needle Core Biopsy, left chest wall lesion - INVASIVE SQUAMOUS CELL CARCINOMA.  12/17/11  REPORT OF SURGICAL PATHOLOGY FINAL DIAGNOSIS Diagnosis 1. Lung, resection (segmental or lobe), Left upper - INVASIVE POORLY DIFFERENTIATED SQUAMOUS CELL CARCINOMA, 1.5 CM AND 1.1 CM, CONFINED WITHIN LUNG PARENCHYMA. - NO EVIDENCE OF ANGIOLYMPHATIC INVASION OR VISCERAL PLEURAL INVOLVEMENT IDENTIFIED. - THREE LYMPH NODES, NEGATIVE FOR METASTATIC CARCINOMA (0/3). 2. Lymph node, biopsy, 5 Node - ONE LYMPH NODE, NEGATIVE FOR METASTATIC CARCINOMA (0/1). 3. Lymph node, biopsy, 6 Node - ONE LYMPH NODE, NEGATIVE FOR METASTATIC CARCINOMA (0/1). 4. Lymph node, biopsy, 11 L - ONE LYMPH NODE, NEGATIVE FOR METASTATIC CARCINOMA (0/1). 5. Lymph node, biopsy, 10 L - ONE LYMPH NODE, NEGATIVE FOR METASTATIC CARCINOMA (0/1). 6. Lymph node, biopsy, 11 L #2 - ONE LYMPH NODE, NEGATIVE FOR METASTATIC CARCINOMA (0/1). 7. Lymph node, biopsy, 10 L #2 - ONE LYMPH NODE, NEGATIVE FOR METASTATIC CARCINOMA (0/1). 8. Lymph node, biopsy, 11 L #3 - ONE LYMPH NODE, NEGATIVE FOR METASTATIC CARCINOMA (0/1). 9. Lymph node, biopsy, 11 L #4 - ONE LYMPH NODE, NEGATIVE FOR METASTATIC CARCINOMA (0/1). 10. Lymph node, biopsy, 4 L - ONE LYMPH NODE, NEGATIVE FOR  METASTATIC CARCINOMA (0/1).  Past/Anticipated interventions by medical oncology, if any: none  Signs/Symptoms  Weight changes, if any: 20 lb weight gain past 8 mos, 20 lb weight loss 6 mos prior to that, denies loss of appetite at this time  Respiratory complaints, if any: SOB w/minimal activity, lengthy episodes of talking. Dry cough.  Hemoptysis, if any: none  Pain issues, if any:  Left chest pain, takes Tylenol and Advil 1 tab each every morning and evening w/fair to good relief but pain never completely gone. Soreness on mid back biopsy site which was done 08/05/13.  SAFETY ISSUES:  Prior radiation? no  Pacemaker/ICD? no  Possible current pregnancy? no  Is the patient on methotrexate? 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

## 2013-08-03 ENCOUNTER — Other Ambulatory Visit: Payer: Self-pay | Admitting: Radiology

## 2013-08-03 ENCOUNTER — Encounter (HOSPITAL_COMMUNITY): Payer: Self-pay | Admitting: Pharmacy Technician

## 2013-08-05 ENCOUNTER — Ambulatory Visit (HOSPITAL_COMMUNITY)
Admission: RE | Admit: 2013-08-05 | Discharge: 2013-08-05 | Disposition: A | Discharge status: Home / Self Care | Payer: Medicare Other | Source: Ambulatory Visit | Attending: Interventional Radiology | Admitting: Interventional Radiology

## 2013-08-05 ENCOUNTER — Ambulatory Visit (HOSPITAL_COMMUNITY)
Admission: RE | Admit: 2013-08-05 | Discharge: 2013-08-05 | Disposition: A | Discharge status: Home / Self Care | Payer: Medicare Other | Source: Ambulatory Visit | Attending: Internal Medicine | Admitting: Internal Medicine

## 2013-08-05 ENCOUNTER — Encounter (HOSPITAL_COMMUNITY): Payer: Self-pay

## 2013-08-05 DIAGNOSIS — J449 Chronic obstructive pulmonary disease, unspecified: Secondary | ICD-10-CM | POA: Insufficient documentation

## 2013-08-05 DIAGNOSIS — G4734 Idiopathic sleep related nonobstructive alveolar hypoventilation: Secondary | ICD-10-CM | POA: Insufficient documentation

## 2013-08-05 DIAGNOSIS — F17200 Nicotine dependence, unspecified, uncomplicated: Secondary | ICD-10-CM | POA: Insufficient documentation

## 2013-08-05 DIAGNOSIS — J4489 Other specified chronic obstructive pulmonary disease: Secondary | ICD-10-CM | POA: Insufficient documentation

## 2013-08-05 DIAGNOSIS — C3490 Malignant neoplasm of unspecified part of unspecified bronchus or lung: Secondary | ICD-10-CM | POA: Insufficient documentation

## 2013-08-05 DIAGNOSIS — I10 Essential (primary) hypertension: Secondary | ICD-10-CM | POA: Insufficient documentation

## 2013-08-05 DIAGNOSIS — I739 Peripheral vascular disease, unspecified: Secondary | ICD-10-CM | POA: Insufficient documentation

## 2013-08-05 LAB — CBC
MCV: 92.9 fL (ref 78.0–100.0)
Platelets: 234 10*3/uL (ref 150–400)
RBC: 3.96 MIL/uL (ref 3.87–5.11)
RDW: 14.3 % (ref 11.5–15.5)
WBC: 6.8 10*3/uL (ref 4.0–10.5)

## 2013-08-05 LAB — PROTIME-INR
INR: 0.95 (ref 0.00–1.49)
Prothrombin Time: 12.5 seconds (ref 11.6–15.2)

## 2013-08-05 LAB — APTT: aPTT: 31 seconds (ref 24–37)

## 2013-08-05 MED ORDER — FENTANYL CITRATE 0.05 MG/ML IJ SOLN
INTRAMUSCULAR | Status: AC | PRN
  Administered 2013-08-05 (×2): 50 ug via INTRAVENOUS

## 2013-08-05 MED ORDER — MIDAZOLAM HCL 2 MG/2ML IJ SOLN
INTRAMUSCULAR | Status: AC | PRN
  Administered 2013-08-05: 0.5 mg via INTRAVENOUS
  Administered 2013-08-05: 1 mg via INTRAVENOUS
  Administered 2013-08-05: 0.5 mg via INTRAVENOUS

## 2013-08-05 MED ORDER — MIDAZOLAM HCL 2 MG/2ML IJ SOLN
INTRAMUSCULAR | Status: AC
  Filled 2013-08-05: qty 6

## 2013-08-05 MED ORDER — FENTANYL CITRATE 0.05 MG/ML IJ SOLN
INTRAMUSCULAR | Status: AC
  Filled 2013-08-05: qty 6

## 2013-08-05 MED ORDER — HYDROCODONE-ACETAMINOPHEN 5-325 MG PO TABS
1.0000 | ORAL_TABLET | ORAL | Status: DC | PRN
  Filled 2013-08-05: qty 2

## 2013-08-05 MED ORDER — SODIUM CHLORIDE 0.9 % IV SOLN
INTRAVENOUS | Status: DC
  Administered 2013-08-05: 09:00:00 via INTRAVENOUS

## 2013-08-05 NOTE — Procedures (Signed)
Interventional Radiology Procedure Note  Procedure: CT guided biopsy of left chest wall/pleural nodule Complications: No immediate Recommendations: - Bedrest until CXR cleared.  Minimize talking, coughing or otherwise straining.  - Follow up 2 hr CXR pending   Signed,  Sterling Big, MD Vascular & Interventional Radiologist [facility]

## 2013-08-05 NOTE — H&P (Signed)
Agree with PA note.  Signed,  Averill Winters K. Darrius Montano, MD [facility]  

## 2013-08-05 NOTE — H&P (Signed)
Andrea [name] is an 77 y.o. female.   Chief Complaint: "I'm having a biopsy" HPI: Patient with history of NSC (squamous cell) lung carcinoma 2012 and recent PET revealing a hypermetabolic  left chest wall mass presents today for CT guided left chest wall mass biopsy.  Past Medical History  Diagnosis Date  . Hypertension   . PVD (peripheral vascular disease)   . History of shingles 1985  . Sleep related hypoxia     USES CPAP  . Tobacco abuse   . Complication of anesthesia     confusion after surgery  . COPD (chronic obstructive pulmonary disease)     Emphezma  . Hypoxia, sleep related     Oxygen 2 liters at night, HOB  approx 30-45 degree  . Arthritis     Hands  . DEMENTIA   . Cancer     l lung cancer  . Hearing loss   . Lung cancer 12/06/11    left upper lung    Past Surgical History  Procedure Laterality Date  . Appendectomy      1957  . Elbow surgery  2008    right elbow  . Tonsillectomy    . Lung biospy    . Lung removal, partial Left 12/16/12    left upper resection    Family History  Problem Relation Age of Onset  . Stroke Mother   . Stroke Father   . Colon cancer Sister   . Anesthesia problems Neg Hx   . Alzheimer's disease Mother   . Lung cancer Father     was a smoker   Social History:  reports that she has been smoking Cigarettes.  She has a 60 pack-year smoking history. She has never used smokeless tobacco. She reports that  drinks alcohol. She reports that she does not use illicit drugs.  Allergies: No Known Allergies  Current outpatient prescriptions:acetaminophen (TYLENOL) 500 MG tablet, Take 500 mg by mouth 2 (two) times daily., Disp: , Rfl: ;  albuterol (PROVENTIL) (2.5 MG/3ML) 0.083% nebulizer solution, Take 2.5 mg by nebulization every 4 (four) hours as needed for shortness of breath. , Disp: , Rfl: ;  aspirin EC 81 MG tablet, Take 81 mg by mouth every morning. , Disp: , Rfl:  cholecalciferol (VITAMIN D) 1000 UNITS tablet, Take 2,000 Units by  mouth every morning. , Disp: , Rfl: ;  Fluticasone-Salmeterol (ADVAIR) 250-50 MCG/DOSE AEPB, Inhale 1 puff into the lungs every 12 (twelve) hours.  , Disp: , Rfl: ;  galantamine (RAZADYNE ER) 16 MG 24 hr capsule, Take 16 mg by mouth every morning., Disp: , Rfl: ;  ibuprofen (ADVIL,MOTRIN) 200 MG tablet, Take 200 mg by mouth 2 (two) times daily., Disp: , Rfl:  losartan (COZAAR) 50 MG tablet, Take 50 mg by mouth every morning., Disp: , Rfl: ;  lovastatin (MEVACOR) 40 MG tablet, Take 40 mg by mouth every morning. , Disp: , Rfl: ;  Memantine HCl ER (NAMENDA XR) 21 MG CP24, Take 1 capsule by mouth every morning., Disp: , Rfl: ;  Multiple Vitamin (MULITIVITAMIN WITH MINERALS) TABS, Take 1 tablet by mouth every morning. , Disp: , Rfl:  Omega-3 Fatty Acids (FISH OIL) 1200 MG CAPS, Take 1 capsule by mouth every morning. , Disp: , Rfl: ;  roflumilast (DALIRESP) 500 MCG TABS tablet, Take 500 mcg by mouth every morning. , Disp: , Rfl: ;  tiotropium (SPIRIVA) 18 MCG inhalation capsule, Place 18 mcg into inhaler and inhale daily. , Disp: , Rfl:  albuterol (PROVENTIL HFA;VENTOLIN HFA) 108 (90 BASE) MCG/ACT inhaler, Inhale 2 puffs into the lungs every 4 (four) hours as needed for shortness of breath. , Disp: , Rfl: ;  alendronate (FOSAMAX) 70 MG tablet, Take 70 mg by mouth every 7 (seven) days. Sundays, Disp: , Rfl:  Current facility-administered medications:0.9 %  sodium chloride infusion, , Intravenous, Continuous, D Jeananne Rama, PA-C, Last Rate: 20 mL/hr at 08/05/13 1610   Results for orders placed during the hospital encounter of 08/05/13 (from the past 48 hour(s))  APTT     Status: None   Collection Time    08/05/13  9:10 AM      Result Value Range   aPTT 31  24 - 37 seconds  CBC     Status: None   Collection Time    08/05/13  9:10 AM      Result Value Range   WBC 6.8  4.0 - 10.5 K/uL   RBC 3.96  3.87 - 5.11 MIL/uL   Hemoglobin 12.5  12.0 - 15.0 g/dL   HCT 96.0  45.4 - 09.8 %   MCV 92.9  78.0 - 100.0 fL    MCH 31.6  26.0 - 34.0 pg   MCHC 34.0  30.0 - 36.0 g/dL   RDW 11.9  14.7 - 82.9 %   Platelets 234  150 - 400 K/uL  PROTIME-INR     Status: None   Collection Time    08/05/13  9:10 AM      Result Value Range   Prothrombin Time 12.5  11.6 - 15.2 seconds   INR 0.95  0.00 - 1.49   No results found.  Review of Systems  Constitutional: Negative for fever and chills.  Respiratory: Positive for cough and shortness of breath. Negative for hemoptysis.   Cardiovascular: Negative for chest pain.       Intermittent left chest wall discomfort  Gastrointestinal: Negative for nausea, vomiting and abdominal pain.  Musculoskeletal: Negative for back pain.  Neurological: Negative for headaches.  Endo/Heme/Allergies: Does not bruise/bleed easily.    Blood pressure 144/57, pulse 73, temperature 97.7 F (36.5 C), temperature source Oral, resp. rate 20, height 5\' 1"  (1.549 m), weight 157 lb (71.215 kg), SpO2 94.00%. Physical Exam  Constitutional: She appears well-developed and well-nourished.  Cardiovascular: Normal rate and regular rhythm.   Respiratory: Effort normal.  Distant BS bilat  GI: Soft. Bowel sounds are normal. There is no tenderness.  Musculoskeletal: Normal range of motion.  Neurological: She is alert.     Assessment/Plan Pt with hx of NSC/squamous cell lung carcinoma 2012 and LUL lobectomy 2013; now with hypermetabolic left chest wall mass on recent PET scan. Plan is for CT guided biopsy of the left chest wall mass today. Details/risks of procedure d/w pt/sister with their understanding and consent.  Parmvir Boomer,D KEVIN 08/05/2013, 9:29 AM

## 2013-08-06 ENCOUNTER — Ambulatory Visit
Admission: RE | Admit: 2013-08-06 | Discharge: 2013-08-06 | Disposition: A | Discharge status: Home / Self Care | Payer: Medicare Other | Source: Ambulatory Visit | Attending: Radiation Oncology | Admitting: Radiation Oncology

## 2013-08-06 ENCOUNTER — Encounter: Payer: Self-pay | Admitting: Radiation Oncology

## 2013-08-06 DIAGNOSIS — C3490 Malignant neoplasm of unspecified part of unspecified bronchus or lung: Secondary | ICD-10-CM | POA: Insufficient documentation

## 2013-08-06 DIAGNOSIS — J449 Chronic obstructive pulmonary disease, unspecified: Secondary | ICD-10-CM | POA: Insufficient documentation

## 2013-08-06 DIAGNOSIS — I739 Peripheral vascular disease, unspecified: Secondary | ICD-10-CM | POA: Insufficient documentation

## 2013-08-06 DIAGNOSIS — G4734 Idiopathic sleep related nonobstructive alveolar hypoventilation: Secondary | ICD-10-CM | POA: Insufficient documentation

## 2013-08-06 DIAGNOSIS — J4489 Other specified chronic obstructive pulmonary disease: Secondary | ICD-10-CM | POA: Insufficient documentation

## 2013-08-06 DIAGNOSIS — I10 Essential (primary) hypertension: Secondary | ICD-10-CM | POA: Insufficient documentation

## 2013-08-06 DIAGNOSIS — F0390 Unspecified dementia without behavioral disturbance: Secondary | ICD-10-CM | POA: Insufficient documentation

## 2013-08-06 DIAGNOSIS — F17200 Nicotine dependence, unspecified, uncomplicated: Secondary | ICD-10-CM | POA: Insufficient documentation

## 2013-08-06 DIAGNOSIS — Z79899 Other long term (current) drug therapy: Secondary | ICD-10-CM | POA: Insufficient documentation

## 2013-08-06 HISTORY — DX: Malignant neoplasm of unspecified part of unspecified bronchus or lung: C34.90

## 2013-08-06 NOTE — Progress Notes (Signed)
Radiation Oncology         [phone]) [phone] ________________________________  Initial outpatient Consultation  Name: Andrea [name] MRN: [id number]  Date: 08/06/2013  DOB: 1936-04-19  FA:OZHYQM,VHQI A, MD  [name], MD   REFERRING PHYSICIAN: [name], MD  DIAGNOSIS: Recurrent non-small cell lung cancer  HISTORY OF PRESENT ILLNESS::Andrea [name] is a 77 y.o. female who is seen out of the courtesy of Dr. Arbutus Ped for an opinion concerning radiation therapy as part of management of patient's recurrent non-small cell lung cancer.  The patient has a prior history of stage II-B non-small cell lung cancer (T3, N0, M0). Patient underwent a left upper lobe lobectomy and lymph node dissection 12/17/2011 under the  direction of Dr. Edwyna Shell. No adjuvant treatment was given. She did well until several weeks ago when she began having pain along the left chest area. She was seen by Dr. Arbutus Ped and imaging studies were performed as documented below. A PET CT scan showed isolated recurrence along the left lateral chest area measuring a 2-1/2 cm.. A biopsy was performed of this area which revealed squamous cell carcinoma consistent with the patient's original primary. In light of the patient's pain and x-ray findings as well as the isolated recurrence she is referred to radiation oncology for consideration for aggressive local therapy.   PREVIOUS RADIATION THERAPY: No  PAST MEDICAL HISTORY:  has a past medical history of Hypertension; PVD (peripheral vascular disease); History of shingles (1985); Sleep related hypoxia; Tobacco abuse; Complication of anesthesia; COPD (chronic obstructive pulmonary disease); Hypoxia, sleep related; Arthritis; DEMENTIA; Cancer; Hearing loss; and Lung cancer (12/06/11).    PAST SURGICAL HISTORY: Past Surgical History  Procedure Laterality Date  . Appendectomy      1957  . Elbow surgery  2008    right elbow  . Tonsillectomy    . Lung biospy    . Lung removal,  partial Left 12/16/12    left upper resection    FAMILY HISTORY: family history includes Alzheimer's disease in her mother; Colon cancer in her sister; Lung cancer in her father; Stroke in her father and mother. There is no history of Anesthesia problems.  SOCIAL HISTORY:  reports that she has been smoking Cigarettes.  She has a 60 pack-year smoking history. She has never used smokeless tobacco. She reports that  drinks alcohol. She reports that she does not use illicit drugs.  ALLERGIES: Review of patient's allergies indicates no known allergies.  MEDICATIONS:  Current Outpatient Prescriptions  Medication Sig Dispense Refill  . acetaminophen (TYLENOL) 500 MG tablet Take 500 mg by mouth 2 (two) times daily.      Marland Kitchen albuterol (PROVENTIL HFA;VENTOLIN HFA) 108 (90 BASE) MCG/ACT inhaler Inhale 2 puffs into the lungs every 4 (four) hours as needed for shortness of breath.       Marland Kitchen albuterol (PROVENTIL) (2.5 MG/3ML) 0.083% nebulizer solution Take 2.5 mg by nebulization every 4 (four) hours as needed for shortness of breath.       Marland Kitchen alendronate (FOSAMAX) 70 MG tablet Take 70 mg by mouth every 7 (seven) days. Sundays      . aspirin EC 81 MG tablet Take 81 mg by mouth every morning.       . cholecalciferol (VITAMIN D) 1000 UNITS tablet Take 2,000 Units by mouth every morning.       . Fluticasone-Salmeterol (ADVAIR) 250-50 MCG/DOSE AEPB Inhale 1 puff into the lungs every 12 (twelve) hours.        Marland Kitchen galantamine (RAZADYNE ER)  16 MG 24 hr capsule Take 16 mg by mouth every morning.      Marland Kitchen ibuprofen (ADVIL,MOTRIN) 200 MG tablet Take 200 mg by mouth 2 (two) times daily.      Marland Kitchen losartan (COZAAR) 50 MG tablet Take 50 mg by mouth every morning.      . lovastatin (MEVACOR) 40 MG tablet Take 40 mg by mouth every morning.       . Memantine HCl ER (NAMENDA XR) 21 MG CP24 Take 1 capsule by mouth every morning.      . Multiple Vitamin (MULITIVITAMIN WITH MINERALS) TABS Take 1 tablet by mouth every morning.       .  Omega-3 Fatty Acids (FISH OIL) 1200 MG CAPS Take 1 capsule by mouth every morning.       . roflumilast (DALIRESP) 500 MCG TABS tablet Take 500 mcg by mouth every morning.       . tiotropium (SPIRIVA) 18 MCG inhalation capsule Place 18 mcg into inhaler and inhale daily.        No current facility-administered medications for this encounter.    REVIEW OF SYSTEMS:  A 15 point review of systems is documented in the electronic medical record. This was obtained by the nursing staff. However, I reviewed this with the patient to discuss relevant findings and make appropriate changes.  She has chronic shortness of breath related to COPD. The patient unfortunately continues to smoke. She denies any significant cough or hemoptysis. She denies any new bony pain headaches dizziness or blurred vision. Her left lateral chest pain worsens with deep inspiration or abduction of the left arm.    PHYSICAL EXAM:  height is 5\' 1"  (1.549 m) and weight is 159 lb 9.6 oz (72.394 kg). Her oral temperature is 97.8 F (36.6 C). Her blood pressure is 132/69 and her pulse is 84. Her respiration is 20 and oxygen saturation is 94%.   BP 132/69  Pulse 84  Temp(Src) 97.8 F (36.6 C) (Oral)  Resp 20  Ht 5\' 1"  (1.549 m)  Wt 159 lb 9.6 oz (72.394 kg)  BMI 30.17 kg/m2  SpO2 94%  General Appearance:    Alert, cooperative, no distress, appears stated age, accompanied by a sister on evaluation today, she has difficulty getting up on the examination table   Head:    Normocephalic, without obvious abnormality, atraumatic  Eyes:    PERRL, conjunctiva/corneas clear, EOM's intact,        Nose:   Nares normal, septum midline, mucosa normal, no drainage    or sinus tenderness  Throat:   Lips, mucosa, and tongue normal; teeth and gums normal  Neck:   Supple, symmetrical, trachea midline, no adenopathy;    thyroid:  no enlargement/tenderness/nodules;    Back:     Symmetric, no curvature, ROM normal, no CVA tenderness  Lungs:     Clear  to auscultation bilaterally, respirations unlabored, some audible wheezing   Chest Wall:    No tenderness or deformity, point tenderness along the left lateral mid chest wall area ,  a well-healed scar in the left posterior lateral chest from her prior surgery, there no signs of local recurrence in this area    Heart:    Regular rate and rhythm,        Abdomen:     Soft, non-tender, bowel sounds active all four quadrants,    no masses, no organomegaly        Extremities:   Extremities normal, atraumatic, some edema, support stockings  in place   Pulses:   2+ and symmetric all extremities  Skin:   Skin color, texture, turgor normal, no rashes or lesions  Lymph nodes:   Cervical, supraclavicular, and axillary nodes normal  Neurologic:    normal strength,      KPS = 50  100 - Normal; no complaints; no evidence of disease. 90   - Able to carry on normal activity; minor signs or symptoms of disease. 80   - Normal activity with effort; some signs or symptoms of disease. 12   - Cares for self; unable to carry on normal activity or to do active work. 60   - Requires occasional assistance, but is able to care for most of his personal needs. 50   - Requires considerable assistance and frequent medical care. 40   - Disabled; requires special care and assistance. 30   - Severely disabled; hospital admission is indicated although death not imminent. 20   - Very sick; hospital admission necessary; active supportive treatment necessary. 10   - Moribund; fatal processes progressing rapidly. 0     - Dead  Karnofsky DA, Abelmann WH, Craver LS and Becenti JH [phone]) The use of the nitrogen mustards in the palliative treatment of carcinoma: with particular reference to bronchogenic carcinoma Cancer 1 634-56  LABORATORY DATA:  Lab Results  Component Value Date   WBC 6.8 08/05/2013   HGB 12.5 08/05/2013   HCT 36.8 08/05/2013   MCV 92.9 08/05/2013   PLT 234 08/05/2013   Lab Results  Component Value  Date   NA 140 07/29/2013   K 3.8 07/29/2013   CL 98 06/01/2013   CO2 28 07/29/2013   Lab Results  Component Value Date   ALT 17 07/29/2013   AST 19 07/29/2013   ALKPHOS 43 07/29/2013   BILITOT 0.62 07/29/2013     RADIOGRAPHY: Dg Chest 1 View  08/05/2013   *RADIOLOGY REPORT*  Clinical Data: Evaluate for pneumothorax.  Post left pleural nodule biopsy.  CHEST - 1 VIEW  Comparison: CT chest 06/23/2013 and chest radiograph 06/01/2013.  Findings: The patient is slightly rotated.  Trachea is midline. Heart size stable. Postoperative changes in the left hemithorax. No definite pneumothorax after left pleural biopsy.  Right basilar scarring.  IMPRESSION:  No definite pneumothorax after left pleural biopsy.   Original Report Authenticated By: Leanna Battles, M.D.   Ct Head W Wo Contrast  07/20/2013   *RADIOLOGY REPORT*  Clinical Data: Intracranial metastatic disease.  CT HEAD WITHOUT AND WITH CONTRAST  Technique:  Contiguous axial images were obtained from the base of the skull through the vertex without and with intravenous contrast.  Contrast: OMNIPAQUE IOHEXOL 300 MG/ML  SOLN  Comparison: 05/05/2009.  11/29/2011.  Findings: Age appropriate atrophy. No mass lesion, mass effect, midline shift, hydrocephalus, hemorrhage.  No territorial ischemia or acute infarction.  Linear bony defect in the left occipital bone is unchanged compared to the 11/29/2011 and probably represents an old skull fracture.  This was not present on 05/05/2009 comparison.  After contrast administration, there is no abnormal enhancement. Postcontrast images are mildly motion degraded.  IMPRESSION: No interval change.  No acute intracranial abnormality or evidence of metastatic disease.   Original Report Authenticated By: Andreas Newport, M.D.   Nm Pet Image Restag (ps) Skull Base To Thigh  07/20/2013   *RADIOLOGY REPORT*  Clinical Data: Subsequent treatment strategy for lung cancer.  NUCLEAR MEDICINE PET SKULL BASE TO THIGH  Fasting  Blood Glucose:  99  Technique:  18.7 mCi F-18 FDG was injected intravenously. CT data was obtained and used for attenuation correction and anatomic localization only.  (This was not acquired as a diagnostic CT examination.) Additional exam technical data entered on technologist worksheet.  Comparison:  CT 06/23/2013, PET CT 11/28/2011  Findings:  Neck: No hypermetabolic lymph nodes in the neck.  Chest:  Focus of nodular pleural thickening along the left side chest wall adjacent to a the focus of bone erosion.  This pleural nodularity  is intensely hypermetabolic with SUV max = 16.0 which is more intense than typically expected of postsurgical inflammation and is  concerning for local recurrence.  No hypermetabolic mediastinal lymph nodes.  There are no hypermetabolic pulmonary nodules.  Abdomen/Pelvis:  No abnormal hypermetabolic activity within the liver, pancreas, adrenal glands, or spleen.  No hypermetabolic lymph nodes in the abdomen or pelvis.  Skeleton:  No focal hypermetabolic activity to suggest skeletal metastasis.  IMPRESSION:  1.  Hypermetabolic nodular thickening associated with  rib destruction along the left chest wall.  Favor local carcinoma recurrence over  postsurgical inflammation.  Recommend percutaneous biopsy. 2.  No evidence of additional metastasis.   Original Report Authenticated By: Genevive Bi, M.D.   Ct Biopsy  08/05/2013   *RADIOLOGY REPORT*  CT GUIDED CORE BIOPSY  Date: 08/05/2013  Clinical History: 77 year old female with a history of squamous cell carcinoma of the lung and now with a hypermetabolic left pleural/chest wall nodule concerning for recurrent/metastatic disease.  CT guided biopsy is requested to facilitate tissue diagnosis.  Procedures Performed: 1. CT guided biopsy  Interventional Radiologist:  Sterling Big, MD  Sedation: Moderate (conscious) sedation was used.  2 mg Versed, 100 mcg Fentanyl were administered intravenously.  The patient's vital signs were  monitored continuously by radiology nursing throughout the procedure.  Sedation Time: 35 minutes  Fluoroscopy time: None  Contrast volume: None  PROCEDURE/FINDINGS:   Informed consent was obtained from the patient following explanation of the procedure, risks, benefits and alternatives. The patient understands, agrees and consents for the procedure. All questions were addressed. A time out was performed.  Maximal barrier sterile technique utilized including caps, mask, sterile gowns, sterile gloves, large sterile drape, hand hygiene, and betadine skin prep.  A planning axial CT scan was performed.  A suitable skin entry site was selected and marked.  Local anesthesia was attained by infiltration with 1% lidocaine.  Using CT fluoroscopic guidance, a 17 gauge trocar needle was advanced through the left lateral chest wall and positioned at the margin of the chest wall/pleural soft tissue nodule.  Multiple 20 gauge fine needle aspirates were obtained.  On site cytopathologic analysis of quick preps demonstrates malignant cellular material. Several small 18 gauge core biopsies were then obtained to facilitate tissue diagnosis.  Post biopsy planning axial CT scan demonstrates no evidence of immediate complication.  The patient tolerated the procedure well.  IMPRESSION:  Technically successful CT guided fine needle aspiration and core biopsy of the left chest wall/pleural-based nodule.  Signed,  Sterling Big, MD Vascular & Interventional Radiologist [facility]   Original Report Authenticated By: Malachy Moan, M.D.      IMPRESSION: Recurrent non-small cell lung cancer.  The patient has isolated recurrence along the left lateral chest wall area with early rib destruction which is likely causing her pain. The patient would be a good candidate for an aggressive course of radiation therapy directed to this area since this is the only area of recurrence. The location of recurrence  does not allow SBRT.  this area could be treated easily without anticipated significant side effects which would be important in light of the patient's performance status at this time. I anticipate minimal normal lung treatment with this planned radiation therapy which would be important in light of the patient's respiratory status. I anticipate approximately 4 weeks of radiation therapy using an accelerated hypofractionated course of treatment   PLAN: simulation and planning next week.  I spent 60 minutes minutes face to face with the patient and more than 50% of that time was spent in counseling and/or coordination of care.   ------------------------------------------------  -----------------------------------  Billie Lade, PhD, MD

## 2013-08-06 NOTE — Progress Notes (Signed)
Please see the Nurse Progress Note in the MD Initial Consult Encounter for this patient. 

## 2013-08-11 ENCOUNTER — Telehealth: Payer: Self-pay | Admitting: Internal Medicine

## 2013-08-11 NOTE — Telephone Encounter (Signed)
I spoke with Andrea [name]. She stated pt is going to have radiation every day for the next 4-6 weeks. She is wanting to know if pt needs to r/s PFT after radiation is over or go ahead and r/s during her radiation tx. Please advise MW thanks

## 2013-08-11 NOTE — Telephone Encounter (Signed)
I called and spoke with Andrea [name] and made her aware.

## 2013-08-11 NOTE — Telephone Encounter (Signed)
Wait until complete by at least 2 weeks but I would be happy to see back at any time if breathing any worse

## 2013-08-12 ENCOUNTER — Ambulatory Visit: Payer: Medicare Other | Admitting: Internal Medicine

## 2013-08-12 ENCOUNTER — Ambulatory Visit
Admission: RE | Admit: 2013-08-12 | Discharge: 2013-08-12 | Disposition: A | Discharge status: Home / Self Care | Payer: Medicare Other | Source: Ambulatory Visit | Attending: Radiation Oncology | Admitting: Radiation Oncology

## 2013-08-12 DIAGNOSIS — F17200 Nicotine dependence, unspecified, uncomplicated: Secondary | ICD-10-CM | POA: Insufficient documentation

## 2013-08-12 DIAGNOSIS — R062 Wheezing: Secondary | ICD-10-CM | POA: Insufficient documentation

## 2013-08-12 DIAGNOSIS — R079 Chest pain, unspecified: Secondary | ICD-10-CM | POA: Insufficient documentation

## 2013-08-12 DIAGNOSIS — Z510 Encounter for antineoplastic radiation therapy: Secondary | ICD-10-CM | POA: Insufficient documentation

## 2013-08-12 DIAGNOSIS — C3490 Malignant neoplasm of unspecified part of unspecified bronchus or lung: Secondary | ICD-10-CM | POA: Insufficient documentation

## 2013-08-12 DIAGNOSIS — R0602 Shortness of breath: Secondary | ICD-10-CM | POA: Insufficient documentation

## 2013-08-12 DIAGNOSIS — L539 Erythematous condition, unspecified: Secondary | ICD-10-CM | POA: Insufficient documentation

## 2013-08-13 NOTE — Progress Notes (Signed)
  Radiation Oncology         [phone] ________________________________  Name: Andrea [name] MRN: [id number]  Date: 08/12/2013  DOB: 05/26/1936  RESPIRATORY MOTION MANAGEMENT SIMULATION  NARRATIVE:  In order to account for effect of respiratory motion on target structures and other organs in the planning and delivery of radiotherapy, this patient underwent respiratory motion management simulation.  To accomplish this, when the patient was brought to the CT simulation planning suite, 4D respiratoy motion management CT images were obtained.  The CT images were loaded into the planning software.  Then, using a variety of tools including Cine, MIP, and standard views, the target volume and planning target volumes (PTV) were delineated.  Avoidance structures were contoured.  Treatment planning then occurred.  Dose volume histograms were generated and reviewed for each of the requested structure.  The resulting plan was carefully reviewed and approved today.  -----------------------------------  Billie Lade, PhD, MD

## 2013-08-13 NOTE — Progress Notes (Signed)
  Radiation Oncology         [phone] ________________________________  Name: Andrea [name] MRN: [id number]  Date: 08/12/2013  DOB: 1936/05/28  SIMULATION AND TREATMENT PLANNING NOTE  DIAGNOSIS:  Recurrent non-small cell lung cancer  NARRATIVE:  The patient was brought to the CT Simulation planning suite.  Identity was confirmed.  All relevant records and images related to the planned course of therapy were reviewed.  The patient freely provided informed written consent to proceed with treatment after reviewing the details related to the planned course of therapy. The consent form was witnessed and verified by the simulation staff.  Then, the patient was set-up in a stable reproducible  supine position for radiation therapy.  CT images were obtained.  Surface markings were placed.  The CT images were loaded into the planning software.  Then the target and avoidance structures were contoured.  Treatment planning then occurred.  The radiation prescription was entered and confirmed.  Then, I designed and supervised the construction of a total of 3 medically necessary complex treatment devices.  I have requested : 3D Simulation  I have requested a DVH of the following structures: GTV, PTV, lungs, Heart , spinal cord.  I have ordered:dose calc.  PLAN:  The patient will receive 42.5 Gy in 17 fractions.  ________________________________  -----------------------------------  Billie Lade, PhD, MD

## 2013-08-17 ENCOUNTER — Ambulatory Visit
Admission: RE | Admit: 2013-08-17 | Discharge: 2013-08-17 | Disposition: A | Discharge status: Home / Self Care | Payer: Medicare Other | Source: Ambulatory Visit | Attending: Radiation Oncology | Admitting: Radiation Oncology

## 2013-08-18 ENCOUNTER — Ambulatory Visit
Admission: RE | Admit: 2013-08-18 | Discharge: 2013-08-18 | Disposition: A | Discharge status: Home / Self Care | Payer: Medicare Other | Source: Ambulatory Visit | Attending: Radiation Oncology | Admitting: Radiation Oncology

## 2013-08-18 ENCOUNTER — Encounter: Payer: Self-pay | Admitting: Radiation Oncology

## 2013-08-18 MED ORDER — HYDROCODONE-ACETAMINOPHEN 5-325 MG PO TABS: 1.0000 | ORAL_TABLET | Freq: Four times a day (QID) | ORAL | Status: DC | PRN

## 2013-08-18 MED ORDER — BIAFINE EX EMUL
Freq: Two times a day (BID) | CUTANEOUS | Status: DC
  Administered 2013-08-18: 14:00:00 via TOPICAL

## 2013-08-18 NOTE — Progress Notes (Addendum)
Pt states she "always has pain in her left side/chest near her 5th rib". She takes Tylenol 1 tab every morning and night w/Advil 1 tab. Daughter states pt needs another pain med. She states pt "moans in her sleep". Pt states it is very difficult to get comfortable to sleep. Pt has occasional cough w/clear sputum, SOB w/minimal exertion and occasionally while sitting. She uses O2 nightly.  Post sim ed completed w/pt and daughter. Gave pt "Radiation and You" booklet w/all pertinent information marked and discussed, re: fatigue, skin irritation/care, nutrition, pain. Gave pt Biafine lotion w/instructions for proper use. All questions answered.

## 2013-08-18 NOTE — Progress Notes (Signed)
  Radiation Oncology         [phone] ________________________________  Name: Andrea [name] MRN: [id number]  Date: 08/17/2013  DOB: Aug 20, 1936  Simulation Verification Note  Status: outpatient  NARRATIVE: The patient was brought to the treatment unit and placed in the planned treatment position. The clinical setup was verified. Then port films were obtained and uploaded to the radiation oncology medical record software.  The treatment beams were carefully compared against the planned radiation fields. The position location and shape of the radiation fields was reviewed. They targeted volume of tissue appears to be appropriately covered by the radiation beams. Organs at risk appear to be excluded as planned.  Based on my personal review, I approved the simulation verification. The patient's treatment will proceed as planned.  -----------------------------------  Billie Lade, PhD, MD

## 2013-08-18 NOTE — Progress Notes (Signed)
[facility]    Radiation Oncology [address] [place]     Andrea [name], M.D. [place], [place] [address]               Billie Lade, M.D., Ph.D. Phone: [phone]      Molli Hazard A. Kathrynn Running, M.D. Fax: [phone]      Radene Gunning, M.D., Ph.D.         Lurline Hare, M.D.         Grayland Jack, M.D Weekly Treatment Management Note  Name: Andrea [name]     MRN: [id number]        CSN: [id number] Date: 08/18/2013      DOB: Oct 08, 1936  CC: Andrea Kayser, MD         [place]    Status: Outpatient  Diagnosis: The encounter diagnosis was Lung cancer, left.  Current Dose: 5 Gy  Current Fraction: 2  Planned Dose: 42.5 Gy  Narrative: Andrea [name] was seen today for weekly treatment management. The chart was checked and CBCT  were reviewed. She is tolerating her radiation treatments well at this time. She continues to have a lot of pain along the left chest and I have written a  prescription for hydrocodone for this issue.  Review of patient's allergies indicates no known allergies.  Current Outpatient Prescriptions  Medication Sig Dispense Refill  . acetaminophen (TYLENOL) 500 MG tablet Take 500 mg by mouth 2 (two) times daily.      Marland Kitchen albuterol (PROVENTIL HFA;VENTOLIN HFA) 108 (90 BASE) MCG/ACT inhaler Inhale 2 puffs into the lungs every 4 (four) hours as needed for shortness of breath.       Marland Kitchen albuterol (PROVENTIL) (2.5 MG/3ML) 0.083% nebulizer solution Take 2.5 mg by nebulization every 4 (four) hours as needed for shortness of breath.       Marland Kitchen alendronate (FOSAMAX) 70 MG tablet Take 70 mg by mouth every 7 (seven) days. Sundays      . aspirin EC 81 MG tablet Take 81 mg by mouth every morning.       . cholecalciferol (VITAMIN D) 1000 UNITS tablet Take 2,000 Units by mouth every morning.       . Emollient (BIAFINE WOUND DRESSING EX) Apply topically.      . Fluticasone-Salmeterol (ADVAIR) 250-50 MCG/DOSE AEPB Inhale 1 puff into the lungs every 12 (twelve) hours.         Marland Kitchen galantamine (RAZADYNE ER) 16 MG 24 hr capsule Take 16 mg by mouth every morning.      Marland Kitchen ibuprofen (ADVIL,MOTRIN) 200 MG tablet Take 200 mg by mouth 2 (two) times daily.      Marland Kitchen losartan (COZAAR) 50 MG tablet Take 50 mg by mouth every morning.      . lovastatin (MEVACOR) 40 MG tablet Take 40 mg by mouth every morning.       . Memantine HCl ER (NAMENDA XR) 21 MG CP24 Take 1 capsule by mouth every morning.      . Multiple Vitamin (MULITIVITAMIN WITH MINERALS) TABS Take 1 tablet by mouth every morning.       . Omega-3 Fatty Acids (FISH OIL) 1200 MG CAPS Take 1 capsule by mouth every morning.       . roflumilast (DALIRESP) 500 MCG TABS tablet Take 500 mcg by mouth every morning.       . tiotropium (SPIRIVA) 18 MCG inhalation capsule Place 18 mcg into inhaler and inhale daily.       Marland Kitchen HYDROcodone-acetaminophen (  NORCO/VICODIN) 5-325 MG per tablet Take 1 tablet by mouth every 6 (six) hours as needed for pain.  30 tablet  0   Current Facility-Administered Medications  Medication Dose Route Frequency Provider Last Rate Last Dose  . topical emolient (BIAFINE) emulsion   Topical BID Billie Lade, MD       Labs:  Lab Results  Component Value Date   WBC 6.8 08/05/2013   HGB 12.5 08/05/2013   HCT 36.8 08/05/2013   MCV 92.9 08/05/2013   PLT 234 08/05/2013   Lab Results  Component Value Date   CREATININE 0.7 07/29/2013   BUN 10.6 07/29/2013   NA 140 07/29/2013   K 3.8 07/29/2013   CL 98 06/01/2013   CO2 28 07/29/2013   Lab Results  Component Value Date   ALT 17 07/29/2013   AST 19 07/29/2013   PHOS 3.2 12/21/2010   BILITOT 0.62 07/29/2013    Physical Examination:  weight is 163 lb (73.936 kg). Her oral temperature is 97.5 F (36.4 C). Her blood pressure is 119/69 and her pulse is 87. Her respiration is 20.    Wt Readings from Last 3 Encounters:  08/18/13 163 lb (73.936 kg)  08/06/13 159 lb 9.6 oz (72.394 kg)  08/05/13 157 lb (71.215 kg)     Lungs - Normal respiratory effort, chest expands  symmetrically. Lungs are clear to auscultation, no crackles or wheezes.  Heart has regular rhythm and rate  Abdomen is soft and non tender with normal bowel sounds  Assessment:  Patient tolerating treatments well  Plan: Continue treatment per original radiation prescription

## 2013-08-19 ENCOUNTER — Ambulatory Visit
Admission: RE | Admit: 2013-08-19 | Discharge: 2013-08-19 | Disposition: A | Discharge status: Home / Self Care | Payer: Medicare Other | Source: Ambulatory Visit | Attending: Radiation Oncology | Admitting: Radiation Oncology

## 2013-08-19 ENCOUNTER — Encounter: Payer: Self-pay | Admitting: Radiation Oncology

## 2013-08-20 ENCOUNTER — Ambulatory Visit: Payer: Medicare Other

## 2013-08-20 ENCOUNTER — Ambulatory Visit
Admission: RE | Admit: 2013-08-20 | Discharge: 2013-08-20 | Disposition: A | Discharge status: Home / Self Care | Payer: Medicare Other | Source: Ambulatory Visit | Attending: Radiation Oncology | Admitting: Radiation Oncology

## 2013-08-21 ENCOUNTER — Ambulatory Visit
Admission: RE | Admit: 2013-08-21 | Discharge: 2013-08-21 | Disposition: A | Discharge status: Home / Self Care | Payer: Medicare Other | Source: Ambulatory Visit | Attending: Radiation Oncology | Admitting: Radiation Oncology

## 2013-08-24 ENCOUNTER — Ambulatory Visit
Admission: RE | Admit: 2013-08-24 | Discharge: 2013-08-24 | Disposition: A | Discharge status: Home / Self Care | Payer: Medicare Other | Source: Ambulatory Visit | Attending: Radiation Oncology | Admitting: Radiation Oncology

## 2013-08-25 ENCOUNTER — Ambulatory Visit
Admission: RE | Admit: 2013-08-25 | Discharge: 2013-08-25 | Disposition: A | Discharge status: Home / Self Care | Payer: Medicare Other | Source: Ambulatory Visit | Attending: Radiation Oncology | Admitting: Radiation Oncology

## 2013-08-25 NOTE — Progress Notes (Signed)
Andrea [name] here with her sister for her weekly under treat visit.  She has had 7 fractions to her left lateral chest wall.  She does have pain in her left side that she is rating at a 9/10.  She says it is worse after treatment because she has to raise her arm.  She does have a nonproductive cough that she says is from sinus drainage.  She is smoking a pack and a half a day.  She does have shortness of breath with activity.  Her skin is intact on her left chest.  She has not started using radiaplex yet.

## 2013-08-25 NOTE — Progress Notes (Signed)
Weekly Management Note Current Dose:  17.5 Gy  Projected Dose: 42.5 Gy   Narrative:  The patient presents for routine under treatment assessment.  CBCT/MVCT images/Port film x-rays were reviewed.  The chart was checked. Continues to smoke. Pain continues. Shortness of breath stable. Not using radiaplex.  Physical Findings: Weight: 160 lb 3.2 oz (72.666 kg). Skin intact. No redness.  Impression:  The patient is tolerating radiation.  Plan:  Continue treatment as planned. Start radiaplex.

## 2013-08-26 ENCOUNTER — Ambulatory Visit
Admission: RE | Admit: 2013-08-26 | Discharge: 2013-08-26 | Disposition: A | Discharge status: Home / Self Care | Payer: Medicare Other | Source: Ambulatory Visit | Attending: Radiation Oncology | Admitting: Radiation Oncology

## 2013-08-27 ENCOUNTER — Ambulatory Visit
Admission: RE | Admit: 2013-08-27 | Discharge: 2013-08-27 | Disposition: A | Discharge status: Home / Self Care | Payer: Medicare Other | Source: Ambulatory Visit | Attending: Radiation Oncology | Admitting: Radiation Oncology

## 2013-08-28 ENCOUNTER — Ambulatory Visit
Admission: RE | Admit: 2013-08-28 | Discharge: 2013-08-28 | Disposition: A | Discharge status: Home / Self Care | Payer: Medicare Other | Source: Ambulatory Visit | Attending: Radiation Oncology | Admitting: Radiation Oncology

## 2013-08-31 ENCOUNTER — Ambulatory Visit
Admission: RE | Admit: 2013-08-31 | Discharge: 2013-08-31 | Disposition: A | Discharge status: Home / Self Care | Payer: Medicare Other | Source: Ambulatory Visit | Attending: Radiation Oncology | Admitting: Radiation Oncology

## 2013-09-01 ENCOUNTER — Ambulatory Visit
Admission: RE | Admit: 2013-09-01 | Discharge: 2013-09-01 | Disposition: A | Discharge status: Home / Self Care | Payer: Medicare Other | Source: Ambulatory Visit | Attending: Radiation Oncology | Admitting: Radiation Oncology

## 2013-09-01 MED ORDER — HYDROCODONE-ACETAMINOPHEN 5-325 MG PO TABS
1.0000 | ORAL_TABLET | Freq: Four times a day (QID) | ORAL | Status: DC | PRN
Start: 2013-09-01 — End: 2014-01-14

## 2013-09-01 NOTE — Progress Notes (Signed)
Edward Qualia here for weekly under treat visit.  She has had 12 fractions to her left lateral chest wall.  She has pain in her left side that she is rating at a 4/10.  She does have shortness of breath that she says is not any worse than when she started treatment.  She does have a cough from sinus drainage.  She does have fatigue.  Her skin is intact on her left chest and back.  She has been using biafine cream occasionally.

## 2013-09-01 NOTE — Progress Notes (Signed)
[facility]    Radiation Oncology [address] [place]     Maryln Gottron, M.D. [place], [place] [address]               Billie Lade, M.D., Ph.D. Phone: [phone]      Molli Hazard A. Kathrynn Running, M.D. Fax: [phone]      Radene Gunning, M.D., Ph.D.         Lurline Hare, M.D.         Grayland Jack, M.D Weekly Treatment Management Note  Name: Andrea [name]     MRN: [id number]        CSN: [id number] Date: 09/01/2013      DOB: Jun 01, 1936  CC: Andrea Kayser, MD         [place]    Status: Outpatient  Diagnosis: The encounter diagnosis was Lung cancer, left.  Current Dose: 30 Gy  Current Fraction: 12  Planned Dose: 42.5 GY  Narrative: Andrea [name] was seen today for weekly treatment management. The chart was checked and CBCT  were reviewed. She is tolerating the treatments well at this time. She has chronic shortness of breath which is no worse. Denies any significant change in the pain along the left lateral chest wall area.  Review of patient's allergies indicates no known allergies. Current Outpatient Prescriptions  Medication Sig Dispense Refill  . acetaminophen (TYLENOL) 500 MG tablet Take 500 mg by mouth 2 (two) times daily.      Marland Kitchen albuterol (PROVENTIL HFA;VENTOLIN HFA) 108 (90 BASE) MCG/ACT inhaler Inhale 2 puffs into the lungs every 4 (four) hours as needed for shortness of breath.       Marland Kitchen albuterol (PROVENTIL) (2.5 MG/3ML) 0.083% nebulizer solution Take 2.5 mg by nebulization every 4 (four) hours as needed for shortness of breath.       Marland Kitchen alendronate (FOSAMAX) 70 MG tablet Take 70 mg by mouth every 7 (seven) days. Sundays      . aspirin EC 81 MG tablet Take 81 mg by mouth every morning.       . cholecalciferol (VITAMIN D) 1000 UNITS tablet Take 2,000 Units by mouth every morning.       . Emollient (BIAFINE WOUND DRESSING EX) Apply topically.      . Fluticasone-Salmeterol (ADVAIR) 250-50 MCG/DOSE AEPB Inhale 1 puff into the lungs every 12 (twelve)  hours.        Marland Kitchen galantamine (RAZADYNE ER) 16 MG 24 hr capsule Take 16 mg by mouth every morning.      Marland Kitchen HYDROcodone-acetaminophen (NORCO/VICODIN) 5-325 MG per tablet Take 1 tablet by mouth every 6 (six) hours as needed for pain.  30 tablet  0  . ibuprofen (ADVIL,MOTRIN) 200 MG tablet Take 200 mg by mouth 2 (two) times daily.      Marland Kitchen losartan (COZAAR) 50 MG tablet Take 50 mg by mouth every morning.      . lovastatin (MEVACOR) 40 MG tablet Take 40 mg by mouth every morning.       . Memantine HCl ER (NAMENDA XR) 21 MG CP24 Take 1 capsule by mouth every morning.      . Multiple Vitamin (MULITIVITAMIN WITH MINERALS) TABS Take 1 tablet by mouth every morning.       . Omega-3 Fatty Acids (FISH OIL) 1200 MG CAPS Take 1 capsule by mouth every morning.       . roflumilast (DALIRESP) 500 MCG TABS tablet Take 500 mcg by mouth every morning.       Marland Kitchen  tiotropium (SPIRIVA) 18 MCG inhalation capsule Place 18 mcg into inhaler and inhale daily.        No current facility-administered medications for this encounter.   Labs:  Lab Results  Component Value Date   WBC 6.8 08/05/2013   HGB 12.5 08/05/2013   HCT 36.8 08/05/2013   MCV 92.9 08/05/2013   PLT 234 08/05/2013   Lab Results  Component Value Date   CREATININE 0.7 07/29/2013   BUN 10.6 07/29/2013   NA 140 07/29/2013   K 3.8 07/29/2013   CL 98 06/01/2013   CO2 28 07/29/2013   Lab Results  Component Value Date   ALT 17 07/29/2013   AST 19 07/29/2013   PHOS 3.2 12/21/2010   BILITOT 0.62 07/29/2013    Physical Examination:  height is 5\' 1"  (1.549 m) and weight is 160 lb 12.8 oz (72.938 kg). Her temperature is 97.7 F (36.5 C). Her blood pressure is 103/46 and her pulse is 103. Her oxygen saturation is 94%.    Wt Readings from Last 3 Encounters:  09/01/13 160 lb 12.8 oz (72.938 kg)  08/25/13 160 lb 3.2 oz (72.666 kg)  08/18/13 163 lb (73.936 kg)     Lungs - Normal respiratory effort, chest expands symmetrically. Lungs are clear to auscultation, no  crackles or wheezes.  Heart has regular rhythm and rate  Abdomen is soft and non tender with normal bowel sounds  Assessment:  Patient tolerating treatments well  Plan: Continue treatment per original radiation prescription

## 2013-09-02 ENCOUNTER — Ambulatory Visit
Admission: RE | Admit: 2013-09-02 | Discharge: 2013-09-02 | Disposition: A | Discharge status: Home / Self Care | Payer: Medicare Other | Source: Ambulatory Visit | Attending: Radiation Oncology | Admitting: Radiation Oncology

## 2013-09-03 ENCOUNTER — Ambulatory Visit
Admission: RE | Admit: 2013-09-03 | Discharge: 2013-09-03 | Disposition: A | Discharge status: Home / Self Care | Payer: Medicare Other | Source: Ambulatory Visit | Attending: Radiation Oncology | Admitting: Radiation Oncology

## 2013-09-04 ENCOUNTER — Ambulatory Visit
Admission: RE | Admit: 2013-09-04 | Discharge: 2013-09-04 | Disposition: A | Discharge status: Home / Self Care | Payer: Medicare Other | Source: Ambulatory Visit | Attending: Radiation Oncology | Admitting: Radiation Oncology

## 2013-09-07 ENCOUNTER — Ambulatory Visit
Admission: RE | Admit: 2013-09-07 | Discharge: 2013-09-07 | Disposition: A | Discharge status: Home / Self Care | Payer: Medicare Other | Source: Ambulatory Visit | Attending: Radiation Oncology | Admitting: Radiation Oncology

## 2013-09-08 ENCOUNTER — Ambulatory Visit
Admission: RE | Admit: 2013-09-08 | Discharge: 2013-09-08 | Disposition: A | Discharge status: Home / Self Care | Payer: Medicare Other | Source: Ambulatory Visit | Attending: Radiation Oncology | Admitting: Radiation Oncology

## 2013-09-08 NOTE — Progress Notes (Signed)
Andrea [name] here with her sister for final treatment visit.  She has pain in her left side that she is rating at a 8/10.  She says it is worse after treatment and starts to get better in the late afternoon.  She is taking hydrocodone/acetaminophen in the morning and at night.  She is wheezing today and her sister days they have both caught a cold.  The patient reports sinus drainage.  She has a frequent cough.  She has shortness of breath with activity.  Her oxygen saturation is 94% on room air.  She does have fatgiue and her sister said she needed to use a walker yesterday because she was unsteady.  She is better today.  Her skin is intact and she is not using biafine cream.

## 2013-09-08 NOTE — Progress Notes (Signed)
[name]    Radiation Oncology [address] [place]     Andrea [name], M.D. [place], [place] [address]               Billie Lade, M.D., Ph.D. Phone: [phone]      Molli Hazard A. Kathrynn Running, M.D. Fax: [phone]      Radene Gunning, M.D., Ph.D.         Lurline Hare, M.D.         Grayland Jack, M.D Weekly Treatment Management Note  Name: Andrea [name]     MRN: [id number]        CSN: [id number] Date: 09/08/2013      DOB: [birth year]  CC: Andrea Kayser, MD         [place]    Status: Outpatient  Diagnosis: The encounter diagnosis was Lung cancer, left.  Current Dose: 42.5 Gy  Current Fraction: 17  Planned Dose: 42.5 Gy  Narrative: Andrea [name] was seen today for weekly treatment management. The chart was checked and CBCT  were reviewed. She is happy to complete her radiation therapy today. She has not noticed any significant improvement in her pain thus far appear.  She denies any breathing problems.  Review of patient's allergies indicates no known allergies. Current Outpatient Prescriptions  Medication Sig Dispense Refill  . acetaminophen (TYLENOL) 500 MG tablet Take 500 mg by mouth 2 (two) times daily.      Marland Kitchen albuterol (PROVENTIL HFA;VENTOLIN HFA) 108 (90 BASE) MCG/ACT inhaler Inhale 2 puffs into the lungs every 4 (four) hours as needed for shortness of breath.       Marland Kitchen albuterol (PROVENTIL) (2.5 MG/3ML) 0.083% nebulizer solution Take 2.5 mg by nebulization every 4 (four) hours as needed for shortness of breath.       Marland Kitchen alendronate (FOSAMAX) 70 MG tablet Take 70 mg by mouth every 7 (seven) days. Sundays      . aspirin EC 81 MG tablet Take 81 mg by mouth every morning.       . cholecalciferol (VITAMIN D) 1000 UNITS tablet Take 2,000 Units by mouth every morning.       . Fluticasone-Salmeterol (ADVAIR) 250-50 MCG/DOSE AEPB Inhale 1 puff into the lungs every 12 (twelve) hours.        Marland Kitchen galantamine (RAZADYNE ER) 16 MG 24 hr capsule Take 16 mg by mouth every  morning.      Marland Kitchen HYDROcodone-acetaminophen (NORCO/VICODIN) 5-325 MG per tablet Take 1 tablet by mouth every 6 (six) hours as needed for pain.  30 tablet  0  . ibuprofen (ADVIL,MOTRIN) 200 MG tablet Take 200 mg by mouth 2 (two) times daily.      Marland Kitchen losartan (COZAAR) 50 MG tablet Take 50 mg by mouth every morning.      . lovastatin (MEVACOR) 40 MG tablet Take 40 mg by mouth every morning.       . Memantine HCl ER (NAMENDA XR) 21 MG CP24 Take 1 capsule by mouth every morning.      . Multiple Vitamin (MULITIVITAMIN WITH MINERALS) TABS Take 1 tablet by mouth every morning.       . Omega-3 Fatty Acids (FISH OIL) 1200 MG CAPS Take 1 capsule by mouth every morning.       . roflumilast (DALIRESP) 500 MCG TABS tablet Take 500 mcg by mouth every morning.       . tiotropium (SPIRIVA) 18 MCG inhalation capsule Place 18 mcg into inhaler and inhale daily.       Marland Kitchen  Emollient (BIAFINE WOUND DRESSING EX) Apply topically.       No current facility-administered medications for this encounter.   Labs:   Physical Examination:  height is 5\' 1"  (1.549 m) and weight is 162 lb 3.2 oz (73.573 kg). Her temperature is 98 F (36.7 C). Her blood pressure is 103/47 and her pulse is 110. Her oxygen saturation is 94%.    Wt Readings from Last 3 Encounters:  09/08/13 162 lb 3.2 oz (73.573 kg)  09/01/13 160 lb 12.8 oz (72.938 kg)  08/25/13 160 lb 3.2 oz (72.666 kg)     Lungs - Normal respiratory effort, chest expands symmetrically. She has expiratory wheezing throughout both lung fields. The skin of the left lateral chest wall area shows some erythema without skin breakdown. Heart has regular rhythm and rate  Abdomen is soft and non tender with normal bowel sounds  Assessment:  Patient tolerated treatments well  Plan: Followup in one month.

## 2013-09-11 ENCOUNTER — Encounter: Payer: Self-pay | Admitting: Radiation Oncology

## 2013-09-11 NOTE — Progress Notes (Signed)
  Radiation Oncology         (336) [phone] ________________________________  Name: Andrea [name] MRN: [id number]  Date: 09/11/2013  DOB: 1936/10/28  End of Treatment Note  Diagnosis:   Recurrent non-small cell lung cancer     Indication for treatment:  Isolated recurrence along the left lateral chest wall area with associated pain       Radiation treatment dates:   September 8 through 09/08/2013  Site/dose:   Left lateral chest wall area, 42.5 gray in 17 fractions  Beams/energy:   3-D conformal using a three-field setup  Narrative: The patient tolerated radiation treatment relatively well.   She however did not notice any improvement in her pain on the day of completion.  Plan: The patient has completed radiation treatment. The patient will return to radiation oncology clinic for routine followup in one month. I advised them to call or return sooner if they have any questions or concerns related to their recovery or treatment.  -----------------------------------  Billie Lade, PhD, MD

## 2013-09-16 ENCOUNTER — Encounter (HOSPITAL_COMMUNITY): Payer: Self-pay | Admitting: Emergency Medicine

## 2013-09-16 ENCOUNTER — Emergency Department (HOSPITAL_COMMUNITY): Payer: Medicare Other

## 2013-09-16 ENCOUNTER — Inpatient Hospital Stay (HOSPITAL_COMMUNITY)
Admission: EM | Admit: 2013-09-16 | Discharge: 2013-09-22 | DRG: 193 | Disposition: A | Discharge status: Home Health | Payer: Medicare Other | Attending: Internal Medicine | Admitting: Internal Medicine

## 2013-09-16 DIAGNOSIS — I10 Essential (primary) hypertension: Secondary | ICD-10-CM | POA: Diagnosis present

## 2013-09-16 DIAGNOSIS — E876 Hypokalemia: Secondary | ICD-10-CM | POA: Diagnosis present

## 2013-09-16 DIAGNOSIS — C3410 Malignant neoplasm of upper lobe, unspecified bronchus or lung: Secondary | ICD-10-CM | POA: Diagnosis present

## 2013-09-16 DIAGNOSIS — D899 Disorder involving the immune mechanism, unspecified: Secondary | ICD-10-CM | POA: Diagnosis present

## 2013-09-16 DIAGNOSIS — F0390 Unspecified dementia without behavioral disturbance: Secondary | ICD-10-CM | POA: Diagnosis present

## 2013-09-16 DIAGNOSIS — H9190 Unspecified hearing loss, unspecified ear: Secondary | ICD-10-CM | POA: Diagnosis present

## 2013-09-16 DIAGNOSIS — C3490 Malignant neoplasm of unspecified part of unspecified bronchus or lung: Secondary | ICD-10-CM | POA: Diagnosis present

## 2013-09-16 DIAGNOSIS — J9601 Acute respiratory failure with hypoxia: Secondary | ICD-10-CM

## 2013-09-16 DIAGNOSIS — R062 Wheezing: Secondary | ICD-10-CM | POA: Diagnosis present

## 2013-09-16 DIAGNOSIS — G9340 Encephalopathy, unspecified: Secondary | ICD-10-CM | POA: Diagnosis not present

## 2013-09-16 DIAGNOSIS — Z923 Personal history of irradiation: Secondary | ICD-10-CM

## 2013-09-16 DIAGNOSIS — F17200 Nicotine dependence, unspecified, uncomplicated: Secondary | ICD-10-CM | POA: Diagnosis present

## 2013-09-16 DIAGNOSIS — J441 Chronic obstructive pulmonary disease with (acute) exacerbation: Secondary | ICD-10-CM | POA: Clinically undetermined

## 2013-09-16 DIAGNOSIS — E871 Hypo-osmolality and hyponatremia: Secondary | ICD-10-CM | POA: Diagnosis present

## 2013-09-16 DIAGNOSIS — J449 Chronic obstructive pulmonary disease, unspecified: Secondary | ICD-10-CM

## 2013-09-16 DIAGNOSIS — J9620 Acute and chronic respiratory failure, unspecified whether with hypoxia or hypercapnia: Secondary | ICD-10-CM

## 2013-09-16 DIAGNOSIS — Z801 Family history of malignant neoplasm of trachea, bronchus and lung: Secondary | ICD-10-CM

## 2013-09-16 DIAGNOSIS — D72829 Elevated white blood cell count, unspecified: Secondary | ICD-10-CM | POA: Diagnosis present

## 2013-09-16 DIAGNOSIS — J189 Pneumonia, unspecified organism: Principal | ICD-10-CM | POA: Diagnosis present

## 2013-09-16 HISTORY — DX: Encephalopathy, unspecified: G93.40

## 2013-09-16 LAB — POCT I-STAT TROPONIN I

## 2013-09-16 LAB — PRO B NATRIURETIC PEPTIDE: Pro B Natriuretic peptide (BNP): 337.9 pg/mL (ref 0–450)

## 2013-09-16 LAB — CBC
HCT: 34.2 % — ABNORMAL LOW (ref 36.0–46.0)
MCHC: 34.2 g/dL (ref 30.0–36.0)
Platelets: 205 10*3/uL (ref 150–400)
RBC: 3.65 MIL/uL — ABNORMAL LOW (ref 3.87–5.11)
RDW: 14.4 % (ref 11.5–15.5)
WBC: 11.9 10*3/uL — ABNORMAL HIGH (ref 4.0–10.5)

## 2013-09-16 LAB — BASIC METABOLIC PANEL
CO2: 27 mEq/L (ref 19–32)
Chloride: 94 mEq/L — ABNORMAL LOW (ref 96–112)
Creatinine, Ser: 0.64 mg/dL (ref 0.50–1.10)
GFR calc Af Amer: >90 mL/min (ref >90)
Glucose, Bld: 152 mg/dL — ABNORMAL HIGH (ref 70–99)
Sodium: 129 mEq/L — ABNORMAL LOW (ref 135–145)

## 2013-09-16 MED ORDER — ALBUTEROL SULFATE (5 MG/ML) 0.5% IN NEBU
5.0000 mg | INHALATION_SOLUTION | Freq: Once | RESPIRATORY_TRACT | Status: AC
  Administered 2013-09-16: 5 mg via RESPIRATORY_TRACT
  Filled 2013-09-16: qty 1

## 2013-09-16 MED ORDER — SODIUM CHLORIDE 0.9 % IV BOLUS (SEPSIS)
500.0000 mL | Freq: Once | INTRAVENOUS | Status: AC
  Administered 2013-09-17: 500 mL via INTRAVENOUS

## 2013-09-16 MED ORDER — DEXTROSE 5 % IV SOLN
1.0000 g | Freq: Once | INTRAVENOUS | Status: AC
  Administered 2013-09-17: 1 g via INTRAVENOUS
  Filled 2013-09-16: qty 1

## 2013-09-16 MED ORDER — IOHEXOL 350 MG/ML SOLN
100.0000 mL | Freq: Once | INTRAVENOUS | Status: AC | PRN
  Administered 2013-09-16: 100 mL via INTRAVENOUS

## 2013-09-16 MED ORDER — VANCOMYCIN HCL IN DEXTROSE 750-5 MG/150ML-% IV SOLN
750.0000 mg | Freq: Two times a day (BID) | INTRAVENOUS | Status: DC
  Administered 2013-09-17: 750 mg via INTRAVENOUS
  Filled 2013-09-16: qty 150

## 2013-09-16 NOTE — ED Notes (Signed)
EKG old and new given to EDP,Harrison,MD.

## 2013-09-16 NOTE — ED Notes (Signed)
EKG old and new given to EDP,Harrison,MD. 

## 2013-09-16 NOTE — ED Notes (Signed)
Pt c/o shortness of breath that has progressively gotten worse throughout the day; NP cough; family reports recently finished radiation for Lung CA; pt with chronic left side chest pain due to radation; pt denies pain currently; O2 Sat 80% on room air upon arrival ; pt up to 90% on 4lpm via 

## 2013-09-16 NOTE — ED Provider Notes (Signed)
CSN: [id number]     Arrival date & time 09/16/13  2009 History   First MD Initiated Contact with Patient 09/16/13 2046     Chief Complaint  Patient presents with  . Shortness of Breath   (Consider location/radiation/quality/duration/timing/severity/associated sxs/prior Treatment) Patient is a 77 y.o. female presenting with shortness of breath and altered mental status. The history is provided by the patient and a relative. No language interpreter was used.  Shortness of Breath Severity:  Severe Onset quality:  Gradual Duration:  2 days Timing:  Constant Progression:  Worsening Chronicity:  New Context: activity   Relieved by:  Rest Worsened by:  Exertion and movement Ineffective treatments:  Oxygen and inhaler Associated symptoms: cough and wheezing   Associated symptoms: no abdominal pain, no chest pain, no diaphoresis, no fever, no headaches, no hemoptysis, no neck pain, no sore throat and no vomiting   Cough:    Cough characteristics:  Barking   Severity:  Severe   Onset quality:  Gradual   Duration:  2 days   Timing:  Constant   Progression:  Worsening   Chronicity:  New Wheezing:    Severity:  Moderate   Onset quality:  Unable to specify   Duration:  2 days   Timing:  Intermittent   Progression:  Waxing and waning Risk factors: hx of cancer   Altered Mental Status Presenting symptoms: confusion and disorientation   Severity:  Moderate Most recent episode:  Today Episode history:  Multiple Timing:  Intermittent Progression:  Waxing and waning Chronicity:  New Context: recent illness   Associated symptoms: difficulty breathing and weakness   Associated symptoms: no abdominal pain, no agitation, no fever, no headaches, no nausea, no palpitations and no vomiting     Past Medical History  Diagnosis Date  . Hypertension   . PVD (peripheral vascular disease)   . History of shingles 1985  . Sleep related hypoxia     USES CPAP  . Tobacco abuse   . Complication of  anesthesia     confusion after surgery  . COPD (chronic obstructive pulmonary disease)     Emphezma  . Hypoxia, sleep related     Oxygen 2 liters at night, HOB  approx 30-45 degree  . Arthritis     Hands  . DEMENTIA   . Cancer     l lung cancer  . Hearing loss   . Lung cancer 12/06/11    left upper lung   Past Surgical History  Procedure Laterality Date  . Appendectomy      1957  . Elbow surgery  2008    right elbow  . Tonsillectomy    . Lung biospy    . Lung removal, partial Left 12/16/12    left upper resection   Family History  Problem Relation Age of Onset  . Stroke Mother   . Stroke Father   . Colon cancer Sister   . Anesthesia problems Neg Hx   . Alzheimer's disease Mother   . Lung cancer Father     was a smoker   History  Substance Use Topics  . Smoking status: Current Every Day Smoker -- 1.50 packs/day for 40 years    Types: Cigarettes  . Smokeless tobacco: Never Used  . Alcohol Use: Yes     Comment: rarely   OB History   Grav Para Term Preterm Abortions TAB SAB Ect Mult Living  Review of Systems  Constitutional: Positive for activity change. Negative for fever, chills, diaphoresis, appetite change and fatigue.  HENT: Negative for congestion, facial swelling, rhinorrhea and sore throat.   Eyes: Negative for photophobia and discharge.  Respiratory: Positive for cough, shortness of breath and wheezing. Negative for hemoptysis and chest tightness.   Cardiovascular: Negative for chest pain, palpitations and leg swelling.  Gastrointestinal: Negative for nausea, vomiting, abdominal pain and diarrhea.  Endocrine: Negative for polydipsia and polyuria.  Genitourinary: Negative for dysuria, frequency, difficulty urinating and pelvic pain.  Musculoskeletal: Negative for arthralgias, back pain, neck pain and neck stiffness.  Skin: Negative for color change and wound.  Allergic/Immunologic: Negative for immunocompromised state.  Neurological:  Positive for weakness. Negative for facial asymmetry, numbness and headaches.  Hematological: Does not bruise/bleed easily.  Psychiatric/Behavioral: Positive for confusion. Negative for agitation.    Allergies  Review of patient's allergies indicates no known allergies.  Home Medications   Current Outpatient Rx  Name  Route  Sig  Dispense  Refill  . acetaminophen (TYLENOL) 500 MG tablet   Oral   Take 500 mg by mouth 2 (two) times daily.         Marland Kitchen albuterol (PROVENTIL HFA;VENTOLIN HFA) 108 (90 BASE) MCG/ACT inhaler   Inhalation   Inhale 2 puffs into the lungs every 4 (four) hours as needed for shortness of breath.          Marland Kitchen albuterol (PROVENTIL) (2.5 MG/3ML) 0.083% nebulizer solution   Nebulization   Take 2.5 mg by nebulization every 4 (four) hours as needed for shortness of breath.          Marland Kitchen alendronate (FOSAMAX) 70 MG tablet   Oral   Take 70 mg by mouth every 7 (seven) days. Sundays         . aspirin EC 81 MG tablet   Oral   Take 81 mg by mouth every morning.          . cholecalciferol (VITAMIN D) 1000 UNITS tablet   Oral   Take 2,000 Units by mouth every morning.          . Emollient (BIAFINE WOUND DRESSING EX)   Apply externally   Apply topically.         . Fluticasone-Salmeterol (ADVAIR) 250-50 MCG/DOSE AEPB   Inhalation   Inhale 1 puff into the lungs every 12 (twelve) hours.           Marland Kitchen galantamine (RAZADYNE ER) 16 MG 24 hr capsule   Oral   Take 16 mg by mouth every morning.         Marland Kitchen HYDROcodone-acetaminophen (NORCO/VICODIN) 5-325 MG per tablet   Oral   Take 1 tablet by mouth every 6 (six) hours as needed for pain.   30 tablet   0   . ibuprofen (ADVIL,MOTRIN) 200 MG tablet   Oral   Take 200 mg by mouth 2 (two) times daily.         Marland Kitchen losartan (COZAAR) 50 MG tablet   Oral   Take 50 mg by mouth every morning.         . lovastatin (MEVACOR) 40 MG tablet   Oral   Take 40 mg by mouth every morning.          . memantine  (NAMENDA) 10 MG tablet   Oral   Take 10 mg by mouth 2 (two) times daily.         . Multiple Vitamin (MULITIVITAMIN WITH MINERALS) TABS  Oral   Take 1 tablet by mouth every morning.          . Omega-3 Fatty Acids (FISH OIL) 1200 MG CAPS   Oral   Take 1 capsule by mouth every morning.          . roflumilast (DALIRESP) 500 MCG TABS tablet   Oral   Take 500 mcg by mouth every morning.          . tiotropium (SPIRIVA) 18 MCG inhalation capsule   Inhalation   Place 18 mcg into inhaler and inhale daily.           BP 134/47  Pulse 115  Temp(Src) 98.3 F (36.8 C) (Oral)  Resp 30  SpO2 94% Physical Exam  Constitutional: She is oriented to person, place, and time. She appears well-developed and well-nourished. No distress.  HENT:  Head: Normocephalic and atraumatic.  Mouth/Throat: No oropharyngeal exudate.  Eyes: Pupils are equal, round, and reactive to light.  Neck: Normal range of motion. Neck supple.  Cardiovascular: Regular rhythm and normal heart sounds.  Tachycardia present.  Exam reveals no gallop and no friction rub.   No murmur heard. Pulmonary/Chest: Effort normal. No respiratory distress. She has wheezes in the right upper field, the right middle field, the right lower field, the left upper field, the left middle field and the left lower field. She has no rales.  Abdominal: Soft. Bowel sounds are normal. She exhibits no distension and no mass. There is no tenderness. There is no rebound and no guarding.  Musculoskeletal: Normal range of motion. She exhibits no edema and no tenderness.  Neurological: She is alert and oriented to person, place, and time. GCS eye subscore is 4. GCS verbal subscore is 4. GCS motor subscore is 6.  Skin: Skin is warm and dry.  Psychiatric: She has a normal mood and affect.    ED Course  Procedures (including critical care time) Labs Review Labs Reviewed  BASIC METABOLIC PANEL - Abnormal; Notable for the following:    Sodium 129  (*)    Potassium 3.4 (*)    Chloride 94 (*)    Glucose, Bld 152 (*)    GFR calc non Af Amer 85 (*)    All other components within normal limits  CBC - Abnormal; Notable for the following:    WBC 11.9 (*)    RBC 3.65 (*)    Hemoglobin 11.7 (*)    HCT 34.2 (*)    All other components within normal limits  CULTURE, BLOOD (ROUTINE X 2)  CULTURE, BLOOD (ROUTINE X 2)  PRO B NATRIURETIC PEPTIDE  POCT I-STAT TROPONIN I   Imaging Review Dg Chest 2 View (if Patient Has Fever And/or Copd)  09/16/2013   *RADIOLOGY REPORT*  Clinical Data: Shortness of breath, cough and weakness.  CHEST - 2 VIEW  Comparison: Chest radiograph performed 08/05/2013  Findings: The lungs are well-aerated.  Mild vascular congestion is noted.  Mild bibasilar airspace opacities likely reflect atelectasis.  There is no evidence of pleural effusion or pneumothorax.  The heart is normal in size; the mediastinal contour is within normal limits.  No acute osseous abnormalities are seen.  IMPRESSION: Mild vascular congestion noted; mild bibasilar airspace opacities likely reflect atelectasis.   Original Report Authenticated By: Tonia Ghent, M.D.   Ct Angio Chest Pe W/cm &/or Wo Cm  09/16/2013   *RADIOLOGY REPORT*  Clinical Data: Worsening shortness of breath.  History of lung cancer.  CT ANGIOGRAPHY CHEST  Technique:  Multidetector CT imaging of the chest using the standard protocol during bolus administration of intravenous contrast. Multiplanar reconstructed images including MIPs were obtained and reviewed to evaluate the vascular anatomy.  Contrast: OMNIPAQUE IOHEXOL 350 MG/ML SOLN  Comparison: Chest radiograph September 16, 2013 and PET CT July 20, 2013  Findings: Main pulmonary artery is not enlarged. Surgical clips at the aortopulmonary window resultant some streak artifact limiting evaluation of the pulmonary artery at this level.  Truncation the left pulmonary artery most consistent with prior pneumonectomy. The evaluation  of the subsegmental branches within the lower lobes due to patient motion.  Tree in bud appearance of the left upper lobe, new from prior examination.  Tree in bud appearance of the right greater than left lower lobes, new from prior examination. No pleural effusions.  Again seen is focal chest wall soft tissue mass at the level of the left sixth and seventh ribs.  No parenchymal mass.  Tracheobronchial tree is patent and midline.  No pneumothorax.  The heart and pericardium are not suspicious.  Moderate calcific atherosclerosis of the aortic arch, no aorta is overall normal in course and caliber.  Coarse calcification in the left breast, unchanged from prior PET CT.  Mild nodular thickening left adrenal gland, similar.  IMPRESSION: Motion degraded examination, lung bases poorly visualized.  No convincing evidence of pulmonary embolism to the level of the segmental branches.  Status post partial left pneumonectomy.  New small airway disease within the left upper lobe, bilateral lower lobes concerning for multifocal pneumonia.  Redemonstration of left chest wall mass, please see CT chest biopsy results August 05, 2013 for further description.   Original Report Authenticated By: Awilda Metro    MDM   1. HCAP (healthcare-associated pneumonia)   2. Hyponatremia    Pt is a 77 y.o. female with Pmhx as above including lung cancer (findished radiation about 2 weeks ago) who presents with several days of worsening SOB, generalized weakness, cough, and now today w/ delerium.  Pt 80% on RA upon arrival, only uses O2 at night.  She has had wet sounding cough, no known fevers, ab pain, n/v, d/a, and has had good PO intake per family.  Pt somewhat confused, but awake, alert.  She is tachycardic, scattered wheezing on pulm exam.  BP stable.  W/u showed WBC 11.9, Na 129, K 3.4, Hb 11.7.  BNP, trop not elevated.  CXR showed mild vascular congestion & mild bibasilar airspace opacities felt to reflect atelectasis.  I did  not feel this explained pt's new O2 requirement and CTA chest ordered which showed likely multifocal pneumonia. Vanc, cefepime ordered and will consult triad for admission.            Shanna Cisco, MD 09/17/13 [phone]

## 2013-09-17 DIAGNOSIS — J449 Chronic obstructive pulmonary disease, unspecified: Secondary | ICD-10-CM

## 2013-09-17 DIAGNOSIS — E876 Hypokalemia: Secondary | ICD-10-CM | POA: Diagnosis present

## 2013-09-17 DIAGNOSIS — J9601 Acute respiratory failure with hypoxia: Secondary | ICD-10-CM | POA: Diagnosis present

## 2013-09-17 DIAGNOSIS — J9600 Acute respiratory failure, unspecified whether with hypoxia or hypercapnia: Secondary | ICD-10-CM

## 2013-09-17 DIAGNOSIS — D72829 Elevated white blood cell count, unspecified: Secondary | ICD-10-CM | POA: Diagnosis present

## 2013-09-17 DIAGNOSIS — E871 Hypo-osmolality and hyponatremia: Secondary | ICD-10-CM | POA: Diagnosis present

## 2013-09-17 DIAGNOSIS — J441 Chronic obstructive pulmonary disease with (acute) exacerbation: Secondary | ICD-10-CM | POA: Clinically undetermined

## 2013-09-17 DIAGNOSIS — J189 Pneumonia, unspecified organism: Principal | ICD-10-CM

## 2013-09-17 LAB — BASIC METABOLIC PANEL
BUN: 16 mg/dL (ref 6–23)
Chloride: 96 mEq/L (ref 96–112)
Creatinine, Ser: 0.6 mg/dL (ref 0.50–1.10)
GFR calc Af Amer: >90 mL/min (ref >90)
GFR calc non Af Amer: 87 mL/min — ABNORMAL LOW (ref >90)
Glucose, Bld: 138 mg/dL — ABNORMAL HIGH (ref 70–99)

## 2013-09-17 LAB — LEGIONELLA ANTIGEN, URINE

## 2013-09-17 LAB — PROTIME-INR: INR: 1.17 (ref 0.00–1.49)

## 2013-09-17 LAB — CBC
HCT: 34.8 % — ABNORMAL LOW (ref 36.0–46.0)
MCHC: 33.9 g/dL (ref 30.0–36.0)
MCV: 94.1 fL (ref 78.0–100.0)
RBC: 3.7 MIL/uL — ABNORMAL LOW (ref 3.87–5.11)
RDW: 14.5 % (ref 11.5–15.5)

## 2013-09-17 LAB — EXPECTORATED SPUTUM ASSESSMENT W GRAM STAIN, RFLX TO RESP C

## 2013-09-17 LAB — MAGNESIUM: Magnesium: 1.8 mg/dL (ref 1.5–2.5)

## 2013-09-17 MED ORDER — VANCOMYCIN HCL IN DEXTROSE 750-5 MG/150ML-% IV SOLN
750.0000 mg | Freq: Two times a day (BID) | INTRAVENOUS | Status: DC
  Administered 2013-09-17 – 2013-09-19 (×4): 750 mg via INTRAVENOUS
  Filled 2013-09-17 (×6): qty 150

## 2013-09-17 MED ORDER — VITAMIN D3 25 MCG (1000 UNIT) PO TABS
2000.0000 [IU] | ORAL_TABLET | Freq: Every morning | ORAL | Status: DC
  Administered 2013-09-17 – 2013-09-22 (×6): 2000 [IU] via ORAL
  Filled 2013-09-17 (×6): qty 2

## 2013-09-17 MED ORDER — GUAIFENESIN-DM 100-10 MG/5ML PO SYRP
5.0000 mL | ORAL_SOLUTION | ORAL | Status: DC | PRN
  Administered 2013-09-17 – 2013-09-20 (×3): 5 mL via ORAL
  Filled 2013-09-17 (×3): qty 10

## 2013-09-17 MED ORDER — ALBUTEROL SULFATE (5 MG/ML) 0.5% IN NEBU: 2.5000 mg | INHALATION_SOLUTION | Freq: Four times a day (QID) | RESPIRATORY_TRACT | Status: DC

## 2013-09-17 MED ORDER — METHYLPREDNISOLONE SODIUM SUCC 125 MG IJ SOLR
60.0000 mg | Freq: Three times a day (TID) | INTRAMUSCULAR | Status: DC
  Administered 2013-09-17 – 2013-09-18 (×4): 60 mg via INTRAVENOUS
  Filled 2013-09-17 (×5): qty 0.96

## 2013-09-17 MED ORDER — IBUPROFEN 200 MG PO TABS
200.0000 mg | ORAL_TABLET | Freq: Two times a day (BID) | ORAL | Status: DC
  Administered 2013-09-17 – 2013-09-22 (×12): 200 mg via ORAL
  Filled 2013-09-17 (×14): qty 1

## 2013-09-17 MED ORDER — SODIUM CHLORIDE 0.9 % IV SOLN: 250.0000 mL | INTRAVENOUS | Status: DC | PRN

## 2013-09-17 MED ORDER — LOSARTAN POTASSIUM 50 MG PO TABS
50.0000 mg | ORAL_TABLET | Freq: Every morning | ORAL | Status: DC
  Administered 2013-09-17: 50 mg via ORAL
  Filled 2013-09-17 (×2): qty 1

## 2013-09-17 MED ORDER — ALBUTEROL SULFATE (5 MG/ML) 0.5% IN NEBU: 2.5000 mg | INHALATION_SOLUTION | RESPIRATORY_TRACT | Status: DC | PRN

## 2013-09-17 MED ORDER — MOMETASONE FURO-FORMOTEROL FUM 100-5 MCG/ACT IN AERO
2.0000 | INHALATION_SPRAY | Freq: Two times a day (BID) | RESPIRATORY_TRACT | Status: DC
  Administered 2013-09-17 – 2013-09-22 (×10): 2 via RESPIRATORY_TRACT
  Filled 2013-09-17: qty 8.8

## 2013-09-17 MED ORDER — GALANTAMINE HYDROBROMIDE 4 MG PO TABS
8.0000 mg | ORAL_TABLET | Freq: Two times a day (BID) | ORAL | Status: DC
  Administered 2013-09-17 – 2013-09-22 (×11): 8 mg via ORAL
  Filled 2013-09-17 (×13): qty 2

## 2013-09-17 MED ORDER — IPRATROPIUM BROMIDE 0.02 % IN SOLN
0.5000 mg | Freq: Four times a day (QID) | RESPIRATORY_TRACT | Status: DC
  Administered 2013-09-17 – 2013-09-22 (×18): 0.5 mg via RESPIRATORY_TRACT
  Filled 2013-09-17 (×19): qty 2.5

## 2013-09-17 MED ORDER — SODIUM CHLORIDE 0.9 % IJ SOLN
3.0000 mL | Freq: Two times a day (BID) | INTRAMUSCULAR | Status: DC
  Administered 2013-09-17 – 2013-09-22 (×7): 3 mL via INTRAVENOUS

## 2013-09-17 MED ORDER — IPRATROPIUM BROMIDE 0.02 % IN SOLN: 0.5000 mg | RESPIRATORY_TRACT | Status: DC | PRN

## 2013-09-17 MED ORDER — SIMVASTATIN 10 MG PO TABS
10.0000 mg | ORAL_TABLET | Freq: Every day | ORAL | Status: DC
  Administered 2013-09-17 – 2013-09-21 (×5): 10 mg via ORAL
  Filled 2013-09-17 (×6): qty 1

## 2013-09-17 MED ORDER — ACETAMINOPHEN 500 MG PO TABS
500.0000 mg | ORAL_TABLET | Freq: Two times a day (BID) | ORAL | Status: DC
  Administered 2013-09-17 – 2013-09-22 (×12): 500 mg via ORAL
  Filled 2013-09-17 (×13): qty 1

## 2013-09-17 MED ORDER — IPRATROPIUM BROMIDE 0.02 % IN SOLN: 0.5000 mg | Freq: Four times a day (QID) | RESPIRATORY_TRACT | Status: DC

## 2013-09-17 MED ORDER — ADULT MULTIVITAMIN W/MINERALS CH
1.0000 | ORAL_TABLET | Freq: Every morning | ORAL | Status: DC
  Administered 2013-09-17 – 2013-09-22 (×6): 1 via ORAL
  Filled 2013-09-17 (×6): qty 1

## 2013-09-17 MED ORDER — ALBUTEROL SULFATE (5 MG/ML) 0.5% IN NEBU
2.5000 mg | INHALATION_SOLUTION | Freq: Four times a day (QID) | RESPIRATORY_TRACT | Status: DC
  Administered 2013-09-17 – 2013-09-18 (×2): 2.5 mg via RESPIRATORY_TRACT
  Filled 2013-09-17 (×2): qty 0.5

## 2013-09-17 MED ORDER — ENOXAPARIN SODIUM 40 MG/0.4ML ~~LOC~~ SOLN
40.0000 mg | SUBCUTANEOUS | Status: DC
  Administered 2013-09-17 – 2013-09-22 (×6): 40 mg via SUBCUTANEOUS
  Filled 2013-09-17 (×6): qty 0.4

## 2013-09-17 MED ORDER — PANTOPRAZOLE SODIUM 40 MG PO TBEC
40.0000 mg | DELAYED_RELEASE_TABLET | Freq: Every morning | ORAL | Status: DC
  Administered 2013-09-17 – 2013-09-22 (×6): 40 mg via ORAL
  Filled 2013-09-17 (×6): qty 1

## 2013-09-17 MED ORDER — SODIUM CHLORIDE 0.9 % IJ SOLN: 3.0000 mL | INTRAMUSCULAR | Status: DC | PRN

## 2013-09-17 MED ORDER — DEXTROSE 5 % IV SOLN
1.0000 g | Freq: Three times a day (TID) | INTRAVENOUS | Status: DC
  Administered 2013-09-17 – 2013-09-21 (×13): 1 g via INTRAVENOUS
  Filled 2013-09-17 (×14): qty 1

## 2013-09-17 MED ORDER — ASPIRIN EC 81 MG PO TBEC
81.0000 mg | DELAYED_RELEASE_TABLET | Freq: Every morning | ORAL | Status: DC
  Administered 2013-09-17 – 2013-09-22 (×6): 81 mg via ORAL
  Filled 2013-09-17 (×7): qty 1

## 2013-09-17 MED ORDER — ROFLUMILAST 500 MCG PO TABS
500.0000 ug | ORAL_TABLET | Freq: Every morning | ORAL | Status: DC
Start: 2013-09-17 — End: 2013-09-22
  Administered 2013-09-17 – 2013-09-22 (×6): 500 ug via ORAL
  Filled 2013-09-17 (×6): qty 1

## 2013-09-17 MED ORDER — TIOTROPIUM BROMIDE MONOHYDRATE 18 MCG IN CAPS
18.0000 ug | ORAL_CAPSULE | Freq: Every day | RESPIRATORY_TRACT | Status: DC
  Administered 2013-09-17: 18 ug via RESPIRATORY_TRACT
  Filled 2013-09-17: qty 5

## 2013-09-17 MED ORDER — POTASSIUM CHLORIDE CRYS ER 20 MEQ PO TBCR
40.0000 meq | EXTENDED_RELEASE_TABLET | Freq: Once | ORAL | Status: AC
  Administered 2013-09-17: 03:00:00 40 meq via ORAL
  Filled 2013-09-17: qty 2

## 2013-09-17 MED ORDER — POTASSIUM CHLORIDE CRYS ER 20 MEQ PO TBCR
40.0000 meq | EXTENDED_RELEASE_TABLET | Freq: Once | ORAL | Status: AC
  Administered 2013-09-17: 16:00:00 40 meq via ORAL
  Filled 2013-09-17: qty 2

## 2013-09-17 MED ORDER — GUAIFENESIN ER 600 MG PO TB12
1200.0000 mg | ORAL_TABLET | Freq: Two times a day (BID) | ORAL | Status: DC
  Administered 2013-09-17 – 2013-09-22 (×10): 1200 mg via ORAL
  Filled 2013-09-17 (×11): qty 2

## 2013-09-17 MED ORDER — MEMANTINE HCL 10 MG PO TABS
10.0000 mg | ORAL_TABLET | Freq: Two times a day (BID) | ORAL | Status: DC
  Administered 2013-09-17 – 2013-09-22 (×12): 10 mg via ORAL
  Filled 2013-09-17 (×13): qty 1

## 2013-09-17 MED ORDER — GALANTAMINE HYDROBROMIDE ER 16 MG PO CP24: 16.0000 mg | ORAL_CAPSULE | Freq: Every morning | ORAL | Status: DC

## 2013-09-17 NOTE — Evaluation (Signed)
Physical Therapy Evaluation Patient Details Name: Andrea [name] MRN: [id number] DOB: 07-28-1936 Today's Date: 09/17/2013 Time: 4098-1191 PT Time Calculation (min): 23 min  PT Assessment / Plan / Recommendation History of Present Illness   is 77 yo female with LUL lung cancer, recently completed radiation therapy, who presents to [facility] ED with main concern of progressively worsening shortness of breath that initially started several days prior to this admission and was present with exertion initially but now also at rest, associated with subjective fevers, chills, productive cough of yellow sputum. Pt denies similar events in the past, denies any specific alleviating or aggravating factors. Pt denies chest pain other than the one present with cough, she denies any specific abdominal or urinary concerns  Clinical Impression  Pt tolerated up to [facility] then recliner. No family present for discussing DC plan. Pt relates has family to assist. Recommend HHPT unless she does not have 24/7 assistance.pt will benefit from PT while in acute care.    PT Assessment  Patient needs continued PT services    Follow Up Recommendations  Home health PT (no family present for confirmation of caregivers 24/7)    Does the patient have the potential to tolerate intense rehabilitation      Barriers to Discharge        Equipment Recommendations  None recommended by PT    Recommendations for Other Services     Frequency Min 3X/week    Precautions / Restrictions Precautions Precautions: Fall Precaution Comments: monitor sats   Pertinent Vitals/Pain sats >98% on 4 l [place]      Mobility  Bed Mobility Bed Mobility: Supine to Sit Supine to Sit: 4: Min assist;HOB elevated;With rails Details for Bed Mobility Assistance: extra time for activity. Transfers Transfers: Sit to Stand;Stand to [employer] Transfers Sit to Stand: 4: Min assist;From bed Stand to Sit: To chair/3-in-1;4: Min assist Stand Pivot  Transfers: 4: Min assist Details for Transfer Assistance: pt able to stand without support. Ambulation/Gait Ambulation/Gait Assistance: 4: Min assist Ambulation Distance (Feet): 5 Feet Assistive device: Rolling walker Ambulation/Gait Assistance Details: pt had DOE after toileting.stood for about 3 minutes for cleaning up after BM.held onto RW. Gait Pattern: Step-to pattern    Exercises     PT Diagnosis: Difficulty walking;Generalized weakness  PT Problem List: Decreased strength;Decreased range of motion;Decreased activity tolerance;Decreased mobility;Decreased knowledge of use of DME;Decreased safety awareness;Decreased knowledge of precautions PT Treatment Interventions: DME instruction;Gait training;Functional mobility training;Therapeutic activities;Therapeutic exercise;Stair training;Patient/family education     PT Goals(Current goals can be found in the care plan section) Acute Rehab PT Goals Patient Stated Goal: I want to go home PT Goal Formulation: With patient Time For Goal Achievement: 10/01/13 Potential to Achieve Goals: Good  Visit Information  Last PT Received On: 09/17/13 Assistance Needed: +1 History of Present Illness:  is 77 yo female with LUL lung cancer, recently completed radiation therapy, who presents to [facility] ED with main concern of progressively worsening shortness of breath that initially started several days prior to this admission and was present with exertion initially but now also at rest, associated with subjective fevers, chills, productive cough of yellow sputum. Pt denies similar events in the past, denies any specific alleviating or aggravating factors. Pt denies chest pain other than the one present with cough, she denies any specific abdominal or urinary concerns       Prior Functioning  Home Living Family/patient expects to be discharged to:: Private residence Living Arrangements: Other relatives Available Help at Discharge: Family  Type of Home:  House Home Access: Stairs to enter [employer] of Steps: 3-4 Entrance Stairs-Rails: None Home Layout: One level Home Equipment: Environmental consultant - 2 wheels Prior Function Level of Independence: Needs assistance ADL's / Homemaking Assistance Needed: niece helps out at times    Cognition  Cognition Arousal/Alertness: Awake/alert Behavior During Therapy: WFL for tasks assessed/performed Overall Cognitive Status: Within Functional Limits for tasks assessed    Extremity/Trunk Assessment Upper Extremity Assessment Upper Extremity Assessment: Generalized weakness Lower Extremity Assessment Lower Extremity Assessment: Generalized weakness Cervical / Trunk Assessment Cervical / Trunk Assessment: Normal   Balance Balance Balance Assessed: Yes Static Standing Balance Static Standing - Balance Support: Bilateral upper extremity supported Static Standing - Level of Assistance: 5: Stand by assistance Static Standing - Comment/# of Minutes: 3  End of Session PT - End of Session Activity Tolerance: Patient tolerated treatment well Patient left: in chair;with call bell/phone within reach;with chair alarm set Nurse Communication: Mobility status  GP     Rada Hay 09/17/2013, 4:59 PM

## 2013-09-17 NOTE — Progress Notes (Signed)
Nutrition Brief Note  Patient identified on the Malnutrition Screening Tool (MST) Report. Pt reports that she has recently gained weight. Currently eating very well. Denies any issues or concerns with her nutrition presently.  Wt Readings from Last 15 Encounters:  09/17/13 161 lb 6 oz (73.2 kg)  09/08/13 162 lb 3.2 oz (73.573 kg)  09/01/13 160 lb 12.8 oz (72.938 kg)  08/25/13 160 lb 3.2 oz (72.666 kg)  08/18/13 163 lb (73.936 kg)  08/06/13 159 lb 9.6 oz (72.394 kg)  08/05/13 157 lb (71.215 kg)  07/29/13 157 lb 12.8 oz (71.578 kg)  07/15/13 159 lb 9.6 oz (72.394 kg)  06/30/13 158 lb 12.8 oz (72.031 kg)  06/04/12 142 lb (64.411 kg)  02/12/12 143 lb (64.864 kg)  01/07/12 138 lb 4.8 oz (62.732 kg)  01/01/12 143 lb (64.864 kg)  12/19/11 149 lb 11.1 oz (67.9 kg)    Body mass index is 30.51 kg/(m^2). Patient meets criteria for Obese Class I based on current BMI.   Current diet order is Regular, patient is consuming approximately 100% of meals at this time. Labs and medications reviewed.   No nutrition interventions warranted at this time. If nutrition issues arise, please consult RD.   Jarold Motto MS, RD, LDN Pager: [phone] After-hours pager: [phone]

## 2013-09-17 NOTE — H&P (Signed)
[facility] History and Physical  ALEKSIS JIGGETTS YNW:[id number] DOB: Oct 28, 1936 DOA: 09/16/2013  Referring physician: ED physician PCP: [name], MD   Chief Complaint: shortness of breath   HPI:  Pt is 77 yo female with LUL lung cancer, recently completed radiation therapy, who presents to [facility] ED with main concern of progressively worsening shortness of breath that initially started several days prior to this admission and was present with exertion initially but now also at rest, associated with subjective fevers, chills, productive cough of yellow sputum. Pt denies similar events in the past, denies any specific alleviating or aggravating factors. Pt denies chest pain other than the one present with cough, she denies any specific abdominal or urinary concerns.   In ED, pt found to be hypoxic with oxygen saturation in 80's on RA that has quickly improved to mid 90's with 2-4 L , CXR and CT chest consistent with multifocal PNA. TSH asked to admit for further evaluation.   Assessment and Plan:  Principal Problem:   Acute respiratory failure with hypoxia - most likely secondary to multifocal PNA as noted on imaging studies - will admit to telemetry unit and will start with providing supportive care with oxygen, analgesia as needed - place on broad spectrum ABX for now due to immunocompromised system - place order for sputum analysis, strep pneumo and urine legionella - nebulizers as needed  Active Problems:   Leukocytosis - secondary to PNA as noted above - management as noted above with ABX - CBC in AM - follow up on blood culture    Pneumonia - multifocal, ABX and sputum analysis    Lung cancer - recently completed last radiation therapy  - will need to notify primary oncologist in AM   Hyponatremia - likely secondary to pre renal etiology and dehydration - will hold off on IVF and small vascular congestion possible as noted on CXR - encouraged PO intake  - repeat  BMP in AM   Hypokalemia - mild, will supplement, check Mg and repeat BMP in AM  Code Status: Full Family Communication: Pt at bedside Disposition Plan: Admit to telemetry bed   Review of Systems:  Constitutional: Negative for diaphoresis.  HENT: Negative for hearing loss, ear pain, nosebleeds, congestion, sore throat, neck pain, tinnitus and ear discharge.   Eyes: Negative for blurred vision, double vision, photophobia, pain, discharge and redness.  Respiratory: Negative for wheezing and stridor.   Cardiovascular: Negative for chest pain, palpitations, orthopnea, claudication and leg swelling.  Gastrointestinal: Negative for nausea, vomiting and abdominal pain. Negative for heartburn, constipation, blood in stool and melena.  Genitourinary: Negative for dysuria, urgency, frequency, hematuria and flank pain.  Musculoskeletal: Negative for myalgias, back pain, joint pain and falls.  Skin: Negative for itching and rash.  Neurological: Negative for tingling, tremors, sensory change, speech change, focal weakness, loss of consciousness and headaches.  Endo/Heme/Allergies: Negative for environmental allergies and polydipsia. Does not bruise/bleed easily.  Psychiatric/Behavioral: Negative for suicidal ideas. The patient is not nervous/anxious.      Past Medical History  Diagnosis Date  . Hypertension   . PVD (peripheral vascular disease)   . History of shingles 1985  . Sleep related hypoxia     USES CPAP  . Tobacco abuse   . Complication of anesthesia     confusion after surgery  . COPD (chronic obstructive pulmonary disease)     Emphezma  . Hypoxia, sleep related     Oxygen 2 liters at night, HOB  approx  30-45 degree  . Arthritis     Hands  . DEMENTIA   . Cancer     l lung cancer  . Hearing loss   . Lung cancer 12/06/11    left upper lung    Past Surgical History  Procedure Laterality Date  . Appendectomy      1957  . Elbow surgery  2008    right elbow  . Tonsillectomy     . Lung biospy    . Lung removal, partial Left 12/16/12    left upper resection    Social History:  reports that she has been smoking Cigarettes.  She has a 60 pack-year smoking history. She has never used smokeless tobacco. She reports that she drinks alcohol. She reports that she does not use illicit drugs.  No Known Allergies  Family History  Problem Relation Age of Onset  . Stroke Mother   . Stroke Father   . Colon cancer Sister   . Anesthesia problems Neg Hx   . Alzheimer's disease Mother   . Lung cancer Father     was a smoker    Prior to Admission medications   Medication Sig Start Date End Date Taking? Authorizing Provider  acetaminophen (TYLENOL) 500 MG tablet Take 500 mg by mouth 2 (two) times daily.   Yes Historical Provider, MD  albuterol (PROVENTIL HFA;VENTOLIN HFA) 108 (90 BASE) MCG/ACT inhaler Inhale 2 puffs into the lungs every 4 (four) hours as needed for shortness of breath.    Yes Historical Provider, MD  albuterol (PROVENTIL) (2.5 MG/3ML) 0.083% nebulizer solution Take 2.5 mg by nebulization every 4 (four) hours as needed for shortness of breath.    Yes Historical Provider, MD  alendronate (FOSAMAX) 70 MG tablet Take 70 mg by mouth every 7 (seven) days. Sundays   Yes Historical Provider, MD  aspirin EC 81 MG tablet Take 81 mg by mouth every morning.    Yes Historical Provider, MD  cholecalciferol (VITAMIN D) 1000 UNITS tablet Take 2,000 Units by mouth every morning.    Yes Historical Provider, MD  Emollient (BIAFINE WOUND DRESSING EX) Apply topically.   Yes Historical Provider, MD  Fluticasone-Salmeterol (ADVAIR) 250-50 MCG/DOSE AEPB Inhale 1 puff into the lungs every 12 (twelve) hours.     Yes Historical Provider, MD  galantamine (RAZADYNE ER) 16 MG 24 hr capsule Take 16 mg by mouth every morning.   Yes Historical Provider, MD  HYDROcodone-acetaminophen (NORCO/VICODIN) 5-325 MG per tablet Take 1 tablet by mouth every 6 (six) hours as needed for pain. 09/01/13  Yes  Billie Lade, MD  ibuprofen (ADVIL,MOTRIN) 200 MG tablet Take 200 mg by mouth 2 (two) times daily.   Yes Historical Provider, MD  losartan (COZAAR) 50 MG tablet Take 50 mg by mouth every morning.   Yes Historical Provider, MD  lovastatin (MEVACOR) 40 MG tablet Take 40 mg by mouth every morning.    Yes Historical Provider, MD  memantine (NAMENDA) 10 MG tablet Take 10 mg by mouth 2 (two) times daily.   Yes Historical Provider, MD  Multiple Vitamin (MULITIVITAMIN WITH MINERALS) TABS Take 1 tablet by mouth every morning.    Yes Historical Provider, MD  Omega-3 Fatty Acids (FISH OIL) 1200 MG CAPS Take 1 capsule by mouth every morning.    Yes Historical Provider, MD  roflumilast (DALIRESP) 500 MCG TABS tablet Take 500 mcg by mouth every morning.    Yes Historical Provider, MD  tiotropium (SPIRIVA) 18 MCG inhalation capsule Place 18 mcg  into inhaler and inhale daily.    Yes Historical Provider, MD    Physical Exam: Filed Vitals:   09/16/13 2022 09/16/13 2042 09/17/13 0010  BP: 134/47  100/46  Pulse: 115  102  Temp: 98.3 F (36.8 C)    TempSrc: Oral    Resp: 30  23  SpO2: 92% 94% 94%    Physical Exam  Constitutional: Appears well-developed and well-nourished. No distress.  HENT: Normocephalic. External right and left ear normal. Dry MM Eyes: Conjunctivae and EOM are normal. PERRLA, no scleral icterus.  Neck: Normal ROM. Neck supple. No JVD. No tracheal deviation. No thyromegaly.  CVS: Regular rhythm, tachycardic, S1/S2 +, no murmurs, no gallops, no carotid bruit.  Pulmonary: Course breath sounds, rhonchi noted bilaterally but no wheezing  Abdominal: Soft. BS +,  no distension, tenderness, rebound or guarding.  Musculoskeletal: Normal range of motion. No edema and no tenderness.  Lymphadenopathy: No lymphadenopathy noted, cervical, inguinal. Neuro: Alert. Normal reflexes, muscle tone coordination. No cranial nerve deficit. Skin: Skin is warm and dry. No rash noted. Not diaphoretic. No  erythema. No pallor.  Psychiatric: Normal mood and affect. Behavior, judgment, thought content normal.   Labs on Admission:  Basic Metabolic Panel:  Recent Labs Lab 09/16/13 2050  NA 129*  K 3.4*  CL 94*  CO2 27  GLUCOSE 152*  BUN 18  CREATININE 0.64  CALCIUM 9.3   CBC:  Recent Labs Lab 09/16/13 2050  WBC 11.9*  HGB 11.7*  HCT 34.2*  MCV 93.7  PLT 205   Radiological Exams on Admission: Dg Chest 2 View (if Patient Has Fever And/or Copd)  09/16/2013    Mild vascular congestion noted; mild bibasilar airspace opacities likely reflect atelectasis.     Ct Angio Chest Pe W/cm &/or Wo Cm  09/16/2013    Motion degraded examination, lung bases poorly visualized.  No convincing evidence of pulmonary embolism to the level of the segmental branches.  Status post partial left pneumonectomy.  New small airway disease within the left upper lobe, bilateral lower lobes concerning for multifocal pneumonia.   EKG: Normal sinus rhythm, no ST/T wave changes  Debbora Presto, MD  [facility] Pager [phone]  If 7PM-7AM, please contact night-coverage [email or web address] Password [email or web address] 09/17/2013, 12:15 AM

## 2013-09-17 NOTE — Progress Notes (Signed)
ANTIBIOTIC CONSULT NOTE - INITIAL  Pharmacy Consult for Vancomcyin Indication: pneumonia  No Known Allergies  Patient Measurements: Height: 5\' 1"  (154.9 cm) Weight: 161 lb 6 oz (73.2 kg) IBW/kg (Calculated) : 47.8   Vital Signs: Temp: 98.3 F (36.8 C) (10/09 0218) Temp src: Oral (10/09 0218) BP: 148/62 mmHg (10/09 0218) Pulse Rate: 118 (10/09 0218) Intake/Output from previous day:   Intake/Output from this shift:    Labs:  Recent Labs  09/16/13 2050  WBC 11.9*  HGB 11.7*  PLT 205  CREATININE 0.64   Estimated Creatinine Clearance: 55.6 ml/min (by C-G formula based on Cr of 0.64). No results found for this basename: VANCOTROUGH, VANCOPEAK, VANCORANDOM, GENTTROUGH, GENTPEAK, GENTRANDOM, TOBRATROUGH, TOBRAPEAK, TOBRARND, AMIKACINPEAK, AMIKACINTROU, AMIKACIN,  in the last 72 hours   Microbiology: No results found for this or any previous visit (from the past 720 hour(s)).  Medical History: Past Medical History  Diagnosis Date  . Hypertension   . PVD (peripheral vascular disease)   . History of shingles 1985  . Sleep related hypoxia     USES CPAP  . Tobacco abuse   . Complication of anesthesia     confusion after surgery  . COPD (chronic obstructive pulmonary disease)     Emphezma  . Hypoxia, sleep related     Oxygen 2 liters at night, HOB  approx 30-45 degree  . Arthritis     Hands  . DEMENTIA   . Cancer     l lung cancer  . Hearing loss   . Lung cancer 12/06/11    left upper lung    Medications:  Scheduled:  . acetaminophen  500 mg Oral BID  . aspirin EC  81 mg Oral q morning - 10a  . ceFEPime (MAXIPIME) IV  1 g Intravenous Q8H  . cholecalciferol  2,000 Units Oral q morning - 10a  . enoxaparin (LOVENOX) injection  40 mg Subcutaneous Q24H  . galantamine  8 mg Oral BID WC  . ibuprofen  200 mg Oral BID  . losartan  50 mg Oral q morning - 10a  . memantine  10 mg Oral BID  . mometasone-formoterol  2 puff Inhalation BID  . multivitamin with minerals   1 tablet Oral q morning - 10a  . potassium chloride  40 mEq Oral Once  . roflumilast  500 mcg Oral q morning - 10a  . simvastatin  10 mg Oral q1800  . sodium chloride  3 mL Intravenous Q12H  . tiotropium  18 mcg Inhalation Daily   Infusions:   Assessment: 77 yo with history of LUL Lung Ca admitted with SOB.  CT chest constitent with PNA.  Cefepime Per MD and Vancomycin per Rx for HCAP.  Goal of Therapy:  Vancomycin trough level 15-20 mcg/ml  Plan:   Vancomycin 750mg  IV q12h  CrCl~55 (N)  F/U Scr/levels/cultures as needed  Susanne Greenhouse R 09/17/2013,2:38 AM

## 2013-09-17 NOTE — Progress Notes (Signed)
[facility] PROGRESS NOTE  MAKENZEE CHOUDHRY UJW:[id number] DOB: [birth year] DOA: 09/16/2013 PCP: [name], MD  Assessment/Plan: #1 acute on chronic respiratory failure with hypoxia Secondary to multifocal pneumonia and probable COPD exacerbation. Patient with wheezing on examination. Sputum Gram stain and cultures pending. Urine Legionella antigen is negative. Urine showed pneumococcus antigen is negative. Blood cultures pending. Will place patient on Mucinex. Continue IV vancomycin and IV cefepime. Place on scheduled nebs. Follow.  #2 probable acute COPD exacerbation Likely secondary to multifocal pneumonia and ongoing tobacco abuse. Will place patient on IV steroids, continue IV vancomycin and cefepime, oxygen, nebulizer treatments, flutter valve. Follow.  #3 healthcare associated pneumonia/multifocal pneumonia  some clinical improvement. Sputum Gram stain and cultures pending. Urine Legionella antigen is negative. Urine pneumococcus antigen is negative. Blood cultures are pending. Continue IV vancomycin and IV cefepime. Place on Mucinex, scheduled nebulizer treatments, flutter valve.  #4 history of left upper lobe lung cancer Patient is status post radiation treatment. Outpatient followup. Will inform oncology of admission.  #5 hyponatremia Improved. Follow.  #6 hypokalemia Replete.  #7 prophylaxis Protonix for GI prophylaxis, Lovenox for DVT prophylaxis.  Code Status: Full Family Communication: Updated patient and daughter at bedside. Disposition Plan: Home when medically stable   Consultants:  None  Procedures:  CT chest 09/16/2013  Chest x-ray 09/16/2013  Antibiotics:  IV Vancomycin 09/17/13  IV CEFEPIME 09/17/13  HPI/Subjective: Patient states some improvement in SOB.  Objective: Filed Vitals:   09/17/13 1432  BP: 123/80  Pulse: 88  Temp: 97.4 F (36.3 C)  Resp: 36    Intake/Output Summary (Last 24 hours) at 09/17/13 1518 Last data filed at  09/17/13 1400  Gross per 24 hour  Intake    480 ml  Output    175 ml  Net    305 ml   Filed Weights   09/17/13 0218  Weight: 73.2 kg (161 lb 6 oz)    Exam:   General:  nad  Cardiovascular: rrr  Respiratory: Diffuse wheezing. No crackles.  Abdomen: Soft/ NT/ND/+BS  Musculoskeletal: No c/c/e  Data Reviewed: Basic Metabolic Panel:  Recent Labs Lab 09/16/13 2050 09/17/13 0253  NA 129* 133*  K 3.4* 3.5  CL 94* 96  CO2 27 27  GLUCOSE 152* 138*  BUN 18 16  CREATININE 0.64 0.60  CALCIUM 9.3 9.2  MG  --  1.8   Liver Function Tests: No results found for this basename: AST, ALT, ALKPHOS, BILITOT, PROT, ALBUMIN,  in the last 168 hours No results found for this basename: LIPASE, AMYLASE,  in the last 168 hours No results found for this basename: AMMONIA,  in the last 168 hours CBC:  Recent Labs Lab 09/16/13 2050 09/17/13 0253  WBC 11.9* 10.8*  HGB 11.7* 11.8*  HCT 34.2* 34.8*  MCV 93.7 94.1  PLT 205 220   Cardiac Enzymes: No results found for this basename: CKTOTAL, CKMB, CKMBINDEX, TROPONINI,  in the last 168 hours BNP (last 3 results)  Recent Labs  09/16/13 2050  PROBNP 337.9   CBG: No results found for this basename: GLUCAP,  in the last 168 hours  Recent Results (from the past 240 hour(s))  CULTURE, EXPECTORATED SPUTUM-ASSESSMENT     Status: None   Collection Time    09/17/13  8:22 AM      Result Value Range Status   Specimen Description SPUTUM   Final   Special Requests NONE   Final   Sputum evaluation     Final   Value:  THIS SPECIMEN IS ACCEPTABLE. RESPIRATORY CULTURE REPORT TO FOLLOW.   Report Status 09/17/2013 FINAL   Final  CULTURE, RESPIRATORY (NON-EXPECTORATED)     Status: None   Collection Time    09/17/13  8:22 AM      Result Value Range Status   Specimen Description SPUTUM   Final   Special Requests NONE   Final   Gram Stain     Final   Value: ABUNDANT WBC PRESENT,BOTH PMN AND MONONUCLEAR     MODERATE SQUAMOUS EPITHELIAL CELLS  PRESENT     MODERATE GRAM POSITIVE COCCI     IN PAIRS IN CHAINS FEW GRAM NEGATIVE RODS     Performed at [facility]   Culture PENDING   Incomplete   Report Status PENDING   Incomplete     Studies: Dg Chest 2 View (if Patient Has Fever And/or Copd)  09/16/2013   *RADIOLOGY REPORT*  Clinical Data: Shortness of breath, cough and weakness.  CHEST - 2 VIEW  Comparison: Chest radiograph performed 08/05/2013  Findings: The lungs are well-aerated.  Mild vascular congestion is noted.  Mild bibasilar airspace opacities likely reflect atelectasis.  There is no evidence of pleural effusion or pneumothorax.  The heart is normal in size; the mediastinal contour is within normal limits.  No acute osseous abnormalities are seen.  IMPRESSION: Mild vascular congestion noted; mild bibasilar airspace opacities likely reflect atelectasis.   Original Report Authenticated By: Tonia Ghent, M.D.   Ct Angio Chest Pe W/cm &/or Wo Cm  09/16/2013   *RADIOLOGY REPORT*  Clinical Data: Worsening shortness of breath.  History of lung cancer.  CT ANGIOGRAPHY CHEST  Technique:  Multidetector CT imaging of the chest using the standard protocol during bolus administration of intravenous contrast. Multiplanar reconstructed images including MIPs were obtained and reviewed to evaluate the vascular anatomy.  Contrast: OMNIPAQUE IOHEXOL 350 MG/ML SOLN  Comparison: Chest radiograph September 16, 2013 and PET CT July 20, 2013  Findings: Main pulmonary artery is not enlarged. Surgical clips at the aortopulmonary window resultant some streak artifact limiting evaluation of the pulmonary artery at this level.  Truncation the left pulmonary artery most consistent with prior pneumonectomy. The evaluation of the subsegmental branches within the lower lobes due to patient motion.  Tree in bud appearance of the left upper lobe, new from prior examination.  Tree in bud appearance of the right greater than left lower lobes, new from prior  examination. No pleural effusions.  Again seen is focal chest wall soft tissue mass at the level of the left sixth and seventh ribs.  No parenchymal mass.  Tracheobronchial tree is patent and midline.  No pneumothorax.  The heart and pericardium are not suspicious.  Moderate calcific atherosclerosis of the aortic arch, no aorta is overall normal in course and caliber.  Coarse calcification in the left breast, unchanged from prior PET CT.  Mild nodular thickening left adrenal gland, similar.  IMPRESSION: Motion degraded examination, lung bases poorly visualized.  No convincing evidence of pulmonary embolism to the level of the segmental branches.  Status post partial left pneumonectomy.  New small airway disease within the left upper lobe, bilateral lower lobes concerning for multifocal pneumonia.  Redemonstration of left chest wall mass, please see CT chest biopsy results August 05, 2013 for further description.   Original Report Authenticated By: Awilda Metro    Scheduled Meds: . acetaminophen  500 mg Oral BID  . albuterol  2.5 mg Nebulization Q6H  . aspirin EC  81 mg Oral q morning - 10a  . ceFEPime (MAXIPIME) IV  1 g Intravenous Q8H  . cholecalciferol  2,000 Units Oral q morning - 10a  . enoxaparin (LOVENOX) injection  40 mg Subcutaneous Q24H  . galantamine  8 mg Oral BID WC  . guaiFENesin  1,200 mg Oral BID  . ibuprofen  200 mg Oral BID  . ipratropium  0.5 mg Nebulization Q6H  . losartan  50 mg Oral q morning - 10a  . memantine  10 mg Oral BID  . methylPREDNISolone (SOLU-MEDROL) injection  60 mg Intravenous TID  . mometasone-formoterol  2 puff Inhalation BID  . multivitamin with minerals  1 tablet Oral q morning - 10a  . potassium chloride  40 mEq Oral Once  . roflumilast  500 mcg Oral q morning - 10a  . simvastatin  10 mg Oral q1800  . sodium chloride  3 mL Intravenous Q12H  . vancomycin  750 mg Intravenous Q12H   Continuous Infusions:   Principal Problem:   Acute respiratory  failure with hypoxia Active Problems:   Hypertension   COPD  GOLD II   Lung cancer   Leukocytosis   Pneumonia   Hyponatremia   Hypokalemia   COPD exacerbation    Time spent: > 35 MINS    [facility]  [facility] Pager [phone]. If 7PM-7AM, please contact night-coverage at [email or web address], password [email or web address] 09/17/2013, 3:18 PM  LOS: 1 day

## 2013-09-17 NOTE — Care Management Note (Addendum)
    Page 1 of 2   09/22/2013     3:15:08 PM   CARE MANAGEMENT NOTE 09/22/2013  Patient:  LATECIA, MILER   Account Number:  [id number]  Date Initiated:  09/17/2013  Documentation initiated by:  Lanier Clam  Subjective/Objective Assessment:   77 Y/O F ADMITTED W/SOB.ZO:XWRU CA.     Action/Plan:   FROM HOME W/NIECE SUPPORT.HAS CANE,RW,3N1.HAS PCP,PHARMACY.   Anticipated DC Date:  09/23/2013   Anticipated DC Plan:  HOME W HOME HEALTH SERVICES      DC Planning Services  CM consult      Choice offered to / List presented to:  C-1 Patient        HH arranged  HH-1 RN  HH-2 PT  HH-7 RESPIRATORY THERAPY      HH agency  [facility]   Status of service:  Completed, signed off Medicare Important Message given?   (If response is "NO", the following Medicare IM given date fields will be blank) Date Medicare IM given:   Date Additional Medicare IM given:    Discharge Disposition:  HOME W HOME HEALTH SERVICES  Per UR Regulation:  Reviewed for med. necessity/level of care/duration of stay  If discussed at Long Length of Stay Meetings, dates discussed:   09/22/2013    Comments:  09/22/13 Kaylamarie Swickard RN,BSN NCM [phone] PATIENT CHOSE [facility], CONFIRMED W/DEBBIE THEY WILL BE ABLE TO TAKE PATIENT W/INSURANCE, AFTER FINDING OUT THAT THAT'S WHO THEY USED IN THE PAST-[facility]BBIE REP AWARE OF REFERRAL, & D/C.HH ORDERS PLACED.[facility] FOR HOME 02.02 SATS >88%RA,NO CHANGES IN 02.PATIENT WILL STAY W/DTR [facility]AR[facility] WHITMARSH [address], [place] [id number],TEL#[phone] [id number].NO FURTHER D/C NEEDS.  09/21/13 Dalilah Curlin RN,BSN NCM [phone] [facility] HHRN/TP/RESP THERAPY.[facility] REP KRISTEN ALREADY FOLLOWING,AWARE OF HH ORDER.WILL CHECK ON 02 WHERE PATIENT RECEIVES IT FROM,ASKED [facility] TO CHECK.  09/18/13 Torrence Branagan RN,BSN NCM [phone] [facility] CHOSEN FOR HH.LEFT VM W/KRISTEN REP AWARE OF REFERRAL.RECOMMEND HHRN-DISEASE MGMNT, & HHPT.AWAIT FINAL HH ORDERS. PT-HH.PROVIDED [facility] AGENCY LIST.  09/17/13  Delmon Andrada RN,BSN NCM [phone] PT ORDERED, AWAIT PT RECOMMENDATIONS.

## 2013-09-18 DIAGNOSIS — E871 Hypo-osmolality and hyponatremia: Secondary | ICD-10-CM

## 2013-09-18 LAB — BASIC METABOLIC PANEL
BUN: 17 mg/dL (ref 6–23)
BUN: 16 mg/dL (ref 6–23)
CO2: 27 mEq/L (ref 19–32)
Calcium: 9.6 mg/dL (ref 8.4–10.5)
Chloride: 97 mEq/L (ref 96–112)
Chloride: 97 mEq/L (ref 96–112)
GFR calc Af Amer: >90 mL/min (ref >90)
GFR calc non Af Amer: >90 mL/min (ref >90)
Glucose, Bld: 178 mg/dL — ABNORMAL HIGH (ref 70–99)
Potassium: 5.5 mEq/L — ABNORMAL HIGH (ref 3.5–5.1)
Potassium: 4.9 mEq/L (ref 3.5–5.1)
Sodium: 129 mEq/L — ABNORMAL LOW (ref 135–145)
Sodium: 132 mEq/L — ABNORMAL LOW (ref 135–145)

## 2013-09-18 LAB — CBC
HCT: 36.6 % (ref 36.0–46.0)
Hemoglobin: 12.2 g/dL (ref 12.0–15.0)
MCHC: 33.3 g/dL (ref 30.0–36.0)
RBC: 3.84 MIL/uL — ABNORMAL LOW (ref 3.87–5.11)
RDW: 14.5 % (ref 11.5–15.5)
WBC: 13.7 10*3/uL — ABNORMAL HIGH (ref 4.0–10.5)

## 2013-09-18 MED ORDER — ALBUTEROL SULFATE (5 MG/ML) 0.5% IN NEBU: 2.5000 mg | INHALATION_SOLUTION | RESPIRATORY_TRACT | Status: DC | PRN

## 2013-09-18 MED ORDER — HALOPERIDOL 0.5 MG PO TABS
0.5000 mg | ORAL_TABLET | Freq: Once | ORAL | Status: AC
  Administered 2013-09-18: 0.5 mg via ORAL
  Filled 2013-09-18: qty 1

## 2013-09-18 MED ORDER — LEVALBUTEROL HCL 0.63 MG/3ML IN NEBU: 0.6300 mg | INHALATION_SOLUTION | RESPIRATORY_TRACT | Status: DC | PRN

## 2013-09-18 MED ORDER — IPRATROPIUM BROMIDE 0.02 % IN SOLN: 0.5000 mg | Freq: Four times a day (QID) | RESPIRATORY_TRACT | Status: DC | PRN

## 2013-09-18 MED ORDER — NICOTINE 21 MG/24HR TD PT24
21.0000 mg | MEDICATED_PATCH | Freq: Every day | TRANSDERMAL | Status: DC
  Administered 2013-09-18 – 2013-09-22 (×5): 21 mg via TRANSDERMAL
  Filled 2013-09-18 (×5): qty 1

## 2013-09-18 MED ORDER — LEVALBUTEROL HCL 0.63 MG/3ML IN NEBU
0.6300 mg | INHALATION_SOLUTION | Freq: Four times a day (QID) | RESPIRATORY_TRACT | Status: DC
  Administered 2013-09-18 – 2013-09-22 (×16): 0.63 mg via RESPIRATORY_TRACT
  Filled 2013-09-18 (×34): qty 3

## 2013-09-18 MED ORDER — METHYLPREDNISOLONE SODIUM SUCC 125 MG IJ SOLR
60.0000 mg | Freq: Two times a day (BID) | INTRAMUSCULAR | Status: DC
  Administered 2013-09-19: 60 mg via INTRAVENOUS
  Filled 2013-09-18 (×3): qty 0.96

## 2013-09-18 MED ORDER — LOSARTAN POTASSIUM 50 MG PO TABS
50.0000 mg | ORAL_TABLET | Freq: Every morning | ORAL | Status: DC
  Administered 2013-09-18 – 2013-09-22 (×5): 50 mg via ORAL
  Filled 2013-09-18 (×5): qty 1

## 2013-09-18 NOTE — Progress Notes (Signed)
[facility] PROGRESS NOTE  Andrea [name] NWG:[id number] DOB: 01-04-1936 DOA: 09/16/2013 PCP: [name], MD  Assessment/Plan: #1 acute on chronic respiratory failure with hypoxia Secondary to multifocal pneumonia and probable COPD exacerbation. Patient with wheezing on examination. Sputum Gram stain and cultures pending. Urine Legionella antigen is negative. Urine showed pneumococcus antigen is negative. Blood cultures pending. Continue IV vancomycin and IV cefepime, Mucinex, nebs. Follow.  #2 probable acute COPD exacerbation Likely secondary to multifocal pneumonia and ongoing tobacco abuse. Clinical improvement. Continue IV steroids taper, IV vancomycin and cefepime, oxygen, nebulizer treatments, flutter valve. Follow.  #3 healthcare associated pneumonia/multifocal pneumonia  some clinical improvement. Sputum Gram stain and cultures pending. Urine Legionella antigen is negative. Urine pneumococcus antigen is negative. Blood cultures are pending. Continue IV vancomycin and IV cefepime, Mucinex, scheduled nebulizer treatments, flutter valve.  #4 history of left upper lobe lung cancer Patient is status post radiation treatment. Outpatient followup.   #5 hyponatremia Improved. Follow.  #6 hypokalemia Repleted.  #7 prophylaxis Protonix for GI prophylaxis, Lovenox for DVT prophylaxis.  Code Status: Full Family Communication: Updated patient and daughter at bedside. Disposition Plan: Home when medically stable   Consultants:  None  Procedures:  CT chest 09/16/2013  Chest x-ray 09/16/2013  Antibiotics:  IV Vancomycin 09/17/13  IV CEFEPIME 09/17/13  HPI/Subjective: Patient states some improvement in SOB.  Objective: Filed Vitals:   09/18/13 1320  BP: 131/69  Pulse: 85  Temp: 98 F (36.7 C)  Resp: 28    Intake/Output Summary (Last 24 hours) at 09/18/13 1433 Last data filed at 09/18/13 1330  Gross per 24 hour  Intake    970 ml  Output    451 ml  Net     519 ml   Filed Weights   09/17/13 0218  Weight: 73.2 kg (161 lb 6 oz)    Exam:   General:  nad  Cardiovascular: rrr  Respiratory: Minimal wheezing. No crackles.  Abdomen: Soft/ NT/ND/+BS  Musculoskeletal: No c/c/e  Data Reviewed: Basic Metabolic Panel:  Recent Labs Lab 09/16/13 2050 09/17/13 0253 09/18/13 0525 09/18/13 0830  NA 129* 133* 129* 132*  K 3.4* 3.5 5.5* 4.9  CL 94* 96 97 97  CO2 27 27 22 27   GLUCOSE 152* 138* 205* 178*  BUN 18 16 17 16   CREATININE 0.64 0.60 0.47* 0.52  CALCIUM 9.3 9.2 9.6 9.6  MG  --  1.8  --   --    Liver Function Tests: No results found for this basename: AST, ALT, ALKPHOS, BILITOT, PROT, ALBUMIN,  in the last 168 hours No results found for this basename: LIPASE, AMYLASE,  in the last 168 hours No results found for this basename: AMMONIA,  in the last 168 hours CBC:  Recent Labs Lab 09/16/13 2050 09/17/13 0253 09/18/13 0525  WBC 11.9* 10.8* 13.7*  HGB 11.7* 11.8* 12.2  HCT 34.2* 34.8* 36.6  MCV 93.7 94.1 95.3  PLT 205 220 272   Cardiac Enzymes: No results found for this basename: CKTOTAL, CKMB, CKMBINDEX, TROPONINI,  in the last 168 hours BNP (last 3 results)  Recent Labs  09/16/13 2050  PROBNP 337.9   CBG: No results found for this basename: GLUCAP,  in the last 168 hours  Recent Results (from the past 240 hour(s))  CULTURE, BLOOD (ROUTINE X 2)     Status: None   Collection Time    09/16/13 11:45 PM      Result Value Range Status   Specimen Description BLOOD RIGHT ARM  Final   Special Requests BOTTLES DRAWN AEROBIC AND ANAEROBIC    Final   Culture  Setup Time     Final   Value: 09/17/2013 03:40     Performed at [facility]   Culture     Final   Value:        BLOOD CULTURE RECEIVED NO GROWTH TO DATE CULTURE WILL BE HELD FOR 5 DAYS BEFORE ISSUING A FINAL NEGATIVE REPORT     Performed at [facility]   Report Status PENDING   Incomplete  CULTURE, BLOOD (ROUTINE X 2)     Status: None    Collection Time    09/16/13 11:52 PM      Result Value Range Status   Specimen Description BLOOD LEFT ARM   Final   Special Requests BOTTLES DRAWN AEROBIC AND ANAEROBIC    Final   Culture  Setup Time     Final   Value: 09/17/2013 03:40     Performed at [facility]   Culture     Final   Value:        BLOOD CULTURE RECEIVED NO GROWTH TO DATE CULTURE WILL BE HELD FOR 5 DAYS BEFORE ISSUING A FINAL NEGATIVE REPORT     Performed at [facility]   Report Status PENDING   Incomplete  CULTURE, EXPECTORATED SPUTUM-ASSESSMENT     Status: None   Collection Time    09/17/13  8:22 AM      Result Value Range Status   Specimen Description SPUTUM   Final   Special Requests NONE   Final   Sputum evaluation     Final   Value: THIS SPECIMEN IS ACCEPTABLE. RESPIRATORY CULTURE REPORT TO FOLLOW.   Report Status 09/17/2013 FINAL   Final  CULTURE, RESPIRATORY (NON-EXPECTORATED)     Status: None   Collection Time    09/17/13  8:22 AM      Result Value Range Status   Specimen Description SPUTUM   Final   Special Requests NONE   Final   Gram Stain     Final   Value: ABUNDANT WBC PRESENT,BOTH PMN AND MONONUCLEAR     MODERATE SQUAMOUS EPITHELIAL CELLS PRESENT     MODERATE GRAM POSITIVE COCCI     IN PAIRS IN CHAINS FEW GRAM NEGATIVE RODS     Performed at [facility]   Culture     Final   Value: NORMAL OROPHARYNGEAL FLORA     Performed at [facility]   Report Status PENDING   Incomplete     Studies: Dg Chest 2 View (if Patient Has Fever And/or Copd)  09/16/2013   *RADIOLOGY REPORT*  Clinical Data: Shortness of breath, cough and weakness.  CHEST - 2 VIEW  Comparison: Chest radiograph performed 08/05/2013  Findings: The lungs are well-aerated.  Mild vascular congestion is noted.  Mild bibasilar airspace opacities likely reflect atelectasis.  There is no evidence of pleural effusion or pneumothorax.  The heart is normal in size; the mediastinal contour is within normal  limits.  No acute osseous abnormalities are seen.  IMPRESSION: Mild vascular congestion noted; mild bibasilar airspace opacities likely reflect atelectasis.   Original Report Authenticated By: Tonia Ghent, M.D.   Ct Angio Chest Pe W/cm &/or Wo Cm  09/16/2013   *RADIOLOGY REPORT*  Clinical Data: Worsening shortness of breath.  History of lung cancer.  CT ANGIOGRAPHY CHEST  Technique:  Multidetector CT imaging of the chest using the standard protocol  during bolus administration of intravenous contrast. Multiplanar reconstructed images including MIPs were obtained and reviewed to evaluate the vascular anatomy.  Contrast: OMNIPAQUE IOHEXOL 350 MG/ML SOLN  Comparison: Chest radiograph September 16, 2013 and PET CT July 20, 2013  Findings: Main pulmonary artery is not enlarged. Surgical clips at the aortopulmonary window resultant some streak artifact limiting evaluation of the pulmonary artery at this level.  Truncation the left pulmonary artery most consistent with prior pneumonectomy. The evaluation of the subsegmental branches within the lower lobes due to patient motion.  Tree in bud appearance of the left upper lobe, new from prior examination.  Tree in bud appearance of the right greater than left lower lobes, new from prior examination. No pleural effusions.  Again seen is focal chest wall soft tissue mass at the level of the left sixth and seventh ribs.  No parenchymal mass.  Tracheobronchial tree is patent and midline.  No pneumothorax.  The heart and pericardium are not suspicious.  Moderate calcific atherosclerosis of the aortic arch, no aorta is overall normal in course and caliber.  Coarse calcification in the left breast, unchanged from prior PET CT.  Mild nodular thickening left adrenal gland, similar.  IMPRESSION: Motion degraded examination, lung bases poorly visualized.  No convincing evidence of pulmonary embolism to the level of the segmental branches.  Status post partial left  pneumonectomy.  New small airway disease within the left upper lobe, bilateral lower lobes concerning for multifocal pneumonia.  Redemonstration of left chest wall mass, please see CT chest biopsy results August 05, 2013 for further description.   Original Report Authenticated By: Awilda Metro    Scheduled Meds: . acetaminophen  500 mg Oral BID  . aspirin EC  81 mg Oral q morning - 10a  . ceFEPime (MAXIPIME) IV  1 g Intravenous Q8H  . cholecalciferol  2,000 Units Oral q morning - 10a  . enoxaparin (LOVENOX) injection  40 mg Subcutaneous Q24H  . galantamine  8 mg Oral BID WC  . guaiFENesin  1,200 mg Oral BID  . ibuprofen  200 mg Oral BID  . ipratropium  0.5 mg Nebulization Q6H  . levalbuterol  0.63 mg Nebulization Q6H  . losartan  50 mg Oral q morning - 10a  . memantine  10 mg Oral BID  . methylPREDNISolone (SOLU-MEDROL) injection  60 mg Intravenous TID  . mometasone-formoterol  2 puff Inhalation BID  . multivitamin with minerals  1 tablet Oral q morning - 10a  . pantoprazole  40 mg Oral q morning - 10a  . roflumilast  500 mcg Oral q morning - 10a  . simvastatin  10 mg Oral q1800  . sodium chloride  3 mL Intravenous Q12H  . vancomycin  750 mg Intravenous Q12H   Continuous Infusions:   Principal Problem:   Acute respiratory failure with hypoxia Active Problems:   Hypertension   COPD  GOLD II   Lung cancer   Leukocytosis   Pneumonia   Hyponatremia   Hypokalemia   COPD exacerbation    Time spent: > 35 MINS    [facility]  [facility] Pager [phone]. If 7PM-7AM, please contact night-coverage at [email or web address], password [facility] 09/18/2013, 2:33 PM  LOS: 2 days

## 2013-09-19 ENCOUNTER — Encounter (HOSPITAL_COMMUNITY): Payer: Self-pay | Admitting: Internal Medicine

## 2013-09-19 ENCOUNTER — Inpatient Hospital Stay (HOSPITAL_COMMUNITY): Payer: Medicare Other

## 2013-09-19 DIAGNOSIS — G9340 Encephalopathy, unspecified: Secondary | ICD-10-CM

## 2013-09-19 HISTORY — DX: Encephalopathy, unspecified: G93.40

## 2013-09-19 LAB — BASIC METABOLIC PANEL
BUN: 11 mg/dL (ref 6–23)
CO2: 25 mEq/L (ref 19–32)
Calcium: 9.8 mg/dL (ref 8.4–10.5)
Calcium: 9.3 mg/dL (ref 8.4–10.5)
Creatinine, Ser: 0.44 mg/dL — ABNORMAL LOW (ref 0.50–1.10)
GFR calc Af Amer: >90 mL/min (ref >90)
GFR calc Af Amer: >90 mL/min (ref >90)
GFR calc non Af Amer: >90 mL/min (ref >90)
GFR calc non Af Amer: >90 mL/min (ref >90)
Glucose, Bld: 145 mg/dL — ABNORMAL HIGH (ref 70–99)
Glucose, Bld: 311 mg/dL — ABNORMAL HIGH (ref 70–99)
Potassium: 4.8 mEq/L (ref 3.5–5.1)
Sodium: 123 mEq/L — ABNORMAL LOW (ref 135–145)
Sodium: 128 mEq/L — ABNORMAL LOW (ref 135–145)

## 2013-09-19 LAB — URINALYSIS, ROUTINE W REFLEX MICROSCOPIC
Bilirubin Urine: NEGATIVE
Leukocytes, UA: NEGATIVE
Nitrite: NEGATIVE
Protein, ur: NEGATIVE mg/dL
Specific Gravity, Urine: 1.015 (ref 1.005–1.030)
Urobilinogen, UA: 0.2 mg/dL (ref 0.0–1.0)
pH: 6 (ref 5.0–8.0)

## 2013-09-19 LAB — CBC
MCV: 91.6 fL (ref 78.0–100.0)
Platelets: 301 10*3/uL (ref 150–400)
RBC: 3.71 MIL/uL — ABNORMAL LOW (ref 3.87–5.11)
RDW: 13.8 % (ref 11.5–15.5)
WBC: 19.1 10*3/uL — ABNORMAL HIGH (ref 4.0–10.5)

## 2013-09-19 LAB — CULTURE, RESPIRATORY: Culture: NORMAL

## 2013-09-19 LAB — CULTURE, RESPIRATORY W GRAM STAIN

## 2013-09-19 LAB — TSH: TSH: 0.703 u[IU]/mL (ref 0.350–4.500)

## 2013-09-19 LAB — SODIUM, URINE, RANDOM: Sodium, Ur: 34 mEq/L

## 2013-09-19 LAB — CREATININE, URINE, RANDOM: Creatinine, Urine: 14.5 mg/dL

## 2013-09-19 LAB — OSMOLALITY, URINE: Osmolality, Ur: 199 mOsm/kg — ABNORMAL LOW (ref 390–1090)

## 2013-09-19 LAB — OSMOLALITY: Osmolality: 278 mOsm/kg (ref 275–300)

## 2013-09-19 MED ORDER — PREDNISONE 20 MG PO TABS
40.0000 mg | ORAL_TABLET | Freq: Every day | ORAL | Status: AC
  Administered 2013-09-20 – 2013-09-22 (×3): 40 mg via ORAL
  Filled 2013-09-19 (×3): qty 2

## 2013-09-19 MED ORDER — HYDROCOD POLST-CHLORPHEN POLST 10-8 MG/5ML PO LQCR
5.0000 mL | Freq: Every evening | ORAL | Status: DC | PRN
  Administered 2013-09-19: 5 mL via ORAL
  Filled 2013-09-19: qty 5

## 2013-09-19 MED ORDER — SODIUM CHLORIDE 0.9 % IV SOLN
INTRAVENOUS | Status: DC
  Administered 2013-09-20 – 2013-09-21 (×2): via INTRAVENOUS

## 2013-09-19 MED ORDER — VANCOMYCIN HCL IN DEXTROSE 1-5 GM/200ML-% IV SOLN
1000.0000 mg | Freq: Two times a day (BID) | INTRAVENOUS | Status: DC
Start: 2013-09-19 — End: 2013-09-21
  Administered 2013-09-19 – 2013-09-21 (×4): 1000 mg via INTRAVENOUS
  Filled 2013-09-19 (×5): qty 200

## 2013-09-19 MED ORDER — SODIUM CHLORIDE 0.9 % IV BOLUS (SEPSIS)
500.0000 mL | Freq: Once | INTRAVENOUS | Status: AC
  Administered 2013-09-19: 14:00:00 500 mL via INTRAVENOUS

## 2013-09-19 NOTE — Progress Notes (Signed)
[facility] PROGRESS NOTE  TYMESHA DITMORE ZOX:[id number] DOB: 07/31/1936 DOA: 09/16/2013 PCP: [name], MD  Assessment/Plan: #1 acute on chronic respiratory failure with hypoxia Secondary to multifocal pneumonia and probable COPD exacerbation. Patient with improvement with wheezing. Sputum Gram stain and cultures pending. Urine Legionella antigen is negative. Urine showed pneumococcus antigen is negative. Blood cultures pending. Continue IV vancomycin and IV cefepime, Mucinex, nebs. Follow.  #2 probable acute COPD exacerbation Likely secondary to multifocal pneumonia and ongoing tobacco abuse. Clinical improvement. Change IV steroids to oral dose with quick taper secondary to AMS. Continue IV vancomycin and cefepime, oxygen, nebulizer treatments, flutter valve. Follow.  #3. Acute Encephalopathy ?? Etiology. Maybe secondary to steriod psychosis vs hyponatremia. Will check a head CT, urine studies, osmolality, TSH. IVF. Quick steriod taper. Follow.  #4 healthcare associated pneumonia/multifocal pneumonia  some clinical improvement. Sputum Gram stain and cultures pending. Urine Legionella antigen is negative. Urine pneumococcus antigen is negative. Blood cultures are pending. Continue IV vancomycin and IV cefepime, Mucinex, scheduled nebulizer treatments, flutter valve.  #5 history of left upper lobe lung cancer Patient is status post radiation treatment. Outpatient followup.   #6 hyponatremia Sodium levels now 123. Will check urine studies, urine and serum osmolality, TSH, cortisol, CT head. IVF. Follow.  #7 hypokalemia Repleted.  #8 prophylaxis Protonix for GI prophylaxis, Lovenox for DVT prophylaxis.  Code Status: Full Family Communication: Updated patient at bedside. Disposition Plan: Home when medically stable   Consultants:  None  Procedures:  CT chest 09/16/2013  Chest x-ray 09/16/2013  Antibiotics:  IV Vancomycin 09/17/13  IV CEFEPIME  09/17/13  HPI/Subjective: Patient confused. Alert to self only. Some lethargy.  Objective: Filed Vitals:   09/19/13 1336  BP: 157/62  Pulse: 111  Temp: 97.9 F (36.6 C)  Resp: 24    Intake/Output Summary (Last 24 hours) at 09/19/13 1345 Last data filed at 09/19/13 1211  Gross per 24 hour  Intake   1020 ml  Output   1500 ml  Net   -480 ml   Filed Weights   09/17/13 0218  Weight: 73.2 kg (161 lb 6 oz)    Exam:   General:  Confused.   Cardiovascular: rrr  Respiratory: Minimal wheezing. No crackles.  Abdomen: Soft/ NT/ND/+BS  Musculoskeletal: No c/c/e  Data Reviewed: Basic Metabolic Panel:  Recent Labs Lab 09/16/13 2050 09/17/13 0253 09/18/13 0525 09/18/13 0830 09/19/13 0830  NA 129* 133* 129* 132* 123*  K 3.4* 3.5 5.5* 4.9 4.8  CL 94* 96 97 97 90*  CO2 27 27 22 27 25   GLUCOSE 152* 138* 205* 178* 145*  BUN 18 16 17 16 14   CREATININE 0.64 0.60 0.47* 0.52 0.44*  CALCIUM 9.3 9.2 9.6 9.6 9.8  MG  --  1.8  --   --   --    Liver Function Tests: No results found for this basename: AST, ALT, ALKPHOS, BILITOT, PROT, ALBUMIN,  in the last 168 hours No results found for this basename: LIPASE, AMYLASE,  in the last 168 hours No results found for this basename: AMMONIA,  in the last 168 hours CBC:  Recent Labs Lab 09/16/13 2050 09/17/13 0253 09/18/13 0525 09/19/13 0830  WBC 11.9* 10.8* 13.7* 19.1*  HGB 11.7* 11.8* 12.2 11.7*  HCT 34.2* 34.8* 36.6 34.0*  MCV 93.7 94.1 95.3 91.6  PLT 205 220 272 301   Cardiac Enzymes: No results found for this basename: CKTOTAL, CKMB, CKMBINDEX, TROPONINI,  in the last 168 hours BNP (last 3 results)  Recent  Labs  09/16/13 2050  PROBNP 337.9   CBG: No results found for this basename: GLUCAP,  in the last 168 hours  Recent Results (from the past 240 hour(s))  CULTURE, BLOOD (ROUTINE X 2)     Status: None   Collection Time    09/16/13 11:45 PM      Result Value Range Status   Specimen Description BLOOD RIGHT ARM    Final   Special Requests BOTTLES DRAWN AEROBIC AND ANAEROBIC    Final   Culture  Setup Time     Final   Value: 09/17/2013 03:40     Performed at [facility]   Culture     Final   Value:        BLOOD CULTURE RECEIVED NO GROWTH TO DATE CULTURE WILL BE HELD FOR 5 DAYS BEFORE ISSUING A FINAL NEGATIVE REPORT     Performed at [facility]   Report Status PENDING   Incomplete  CULTURE, BLOOD (ROUTINE X 2)     Status: None   Collection Time    09/16/13 11:52 PM      Result Value Range Status   Specimen Description BLOOD LEFT ARM   Final   Special Requests BOTTLES DRAWN AEROBIC AND ANAEROBIC    Final   Culture  Setup Time     Final   Value: 09/17/2013 03:40     Performed at [facility]   Culture     Final   Value:        BLOOD CULTURE RECEIVED NO GROWTH TO DATE CULTURE WILL BE HELD FOR 5 DAYS BEFORE ISSUING A FINAL NEGATIVE REPORT     Performed at [facility]   Report Status PENDING   Incomplete  CULTURE, EXPECTORATED SPUTUM-ASSESSMENT     Status: None   Collection Time    09/17/13  8:22 AM      Result Value Range Status   Specimen Description SPUTUM   Final   Special Requests NONE   Final   Sputum evaluation     Final   Value: THIS SPECIMEN IS ACCEPTABLE. RESPIRATORY CULTURE REPORT TO FOLLOW.   Report Status 09/17/2013 FINAL   Final  CULTURE, RESPIRATORY (NON-EXPECTORATED)     Status: None   Collection Time    09/17/13  8:22 AM      Result Value Range Status   Specimen Description SPUTUM   Final   Special Requests NONE   Final   Gram Stain     Final   Value: ABUNDANT WBC PRESENT,BOTH PMN AND MONONUCLEAR     MODERATE SQUAMOUS EPITHELIAL CELLS PRESENT     MODERATE GRAM POSITIVE COCCI     IN PAIRS IN CHAINS FEW GRAM NEGATIVE RODS     Performed at [facility]   Culture     Final   Value: NORMAL OROPHARYNGEAL FLORA     Performed at [facility]   Report Status PENDING   Incomplete     Studies: No results  found.  Scheduled Meds: . acetaminophen  500 mg Oral BID  . aspirin EC  81 mg Oral q morning - 10a  . ceFEPime (MAXIPIME) IV  1 g Intravenous Q8H  . cholecalciferol  2,000 Units Oral q morning - 10a  . enoxaparin (LOVENOX) injection  40 mg Subcutaneous Q24H  . galantamine  8 mg Oral BID WC  . guaiFENesin  1,200 mg Oral BID  . ibuprofen  200 mg Oral BID  .  ipratropium  0.5 mg Nebulization Q6H  . levalbuterol  0.63 mg Nebulization Q6H  . losartan  50 mg Oral q morning - 10a  . memantine  10 mg Oral BID  . mometasone-formoterol  2 puff Inhalation BID  . multivitamin with minerals  1 tablet Oral q morning - 10a  . nicotine  21 mg Transdermal Daily  . pantoprazole  40 mg Oral q morning - 10a  . [START ON 09/20/2013] predniSONE  40 mg Oral QAC breakfast  . roflumilast  500 mcg Oral q morning - 10a  . simvastatin  10 mg Oral q1800  . sodium chloride  500 mL Intravenous Once  . sodium chloride  3 mL Intravenous Q12H  . vancomycin  1,000 mg Intravenous Q12H   Continuous Infusions: . sodium chloride      Principal Problem:   Acute respiratory failure with hypoxia Active Problems:   Hypertension   COPD  GOLD II   Lung cancer   Leukocytosis   Pneumonia   Hyponatremia   Hypokalemia   COPD exacerbation   Acute encephalopathy    Time spent: > 35 MINS    [facility]  [facility] Pager [phone]. If 7PM-7AM, please contact night-coverage at [email or web address], password [facility] 09/19/2013, 1:45 PM  LOS: 3 days

## 2013-09-19 NOTE — Progress Notes (Signed)
ANTIBIOTIC CONSULT NOTE - FOLLOW UP  Pharmacy Consult for Vancomycin and Cefepime Indication: pneumonia  No Known Allergies  Patient Measurements: Height: 5\' 1"  (154.9 cm) Weight: 161 lb 6 oz (73.2 kg) IBW/kg (Calculated) : 47.8  Vital Signs: Temp: 97.8 F (36.6 C) (10/11 0525) Temp src: Oral (10/11 0525) BP: 176/67 mmHg (10/11 0541) Pulse Rate: 100 (10/11 0541) Intake/Output from previous day: 10/10 0701 - 10/11 0700 In: 1650 [P.O.:1200; IV Piggyback:450] Out: 700 [Urine:700] Intake/Output from this shift: Total I/O In: 240 [P.O.:240] Out: 800 [Urine:800]  Labs:  Recent Labs  09/17/13 0253 09/18/13 0525 09/18/13 0830 09/19/13 0830  WBC 10.8* 13.7*  --  19.1*  HGB 11.8* 12.2  --  11.7*  PLT 220 272  --  301  CREATININE 0.60 0.47* 0.52 0.44*   Estimated Creatinine Clearance: 55.6 ml/min (by C-G formula based on Cr of 0.44).  69 ml/min/1.50m2 (normalized)   Recent Labs  09/19/13 1122  VANCOTROUGH 9.1*     Microbiology: Recent Results (from the past 720 hour(s))  CULTURE, BLOOD (ROUTINE X 2)     Status: None   Collection Time    09/16/13 11:45 PM      Result Value Range Status   Specimen Description BLOOD RIGHT ARM   Final   Special Requests BOTTLES DRAWN AEROBIC AND ANAEROBIC    Final   Culture  Setup Time     Final   Value: 09/17/2013 03:40     Performed at [facility]   Culture     Final   Value:        BLOOD CULTURE RECEIVED NO GROWTH TO DATE CULTURE WILL BE HELD FOR 5 DAYS BEFORE ISSUING A FINAL NEGATIVE REPORT     Performed at [facility]   Report Status PENDING   Incomplete  CULTURE, BLOOD (ROUTINE X 2)     Status: None   Collection Time    09/16/13 11:52 PM      Result Value Range Status   Specimen Description BLOOD LEFT ARM   Final   Special Requests BOTTLES DRAWN AEROBIC AND ANAEROBIC    Final   Culture  Setup Time     Final   Value: 09/17/2013 03:40     Performed at [facility]   Culture     Final   Value:        BLOOD CULTURE RECEIVED NO GROWTH TO DATE CULTURE WILL BE HELD FOR 5 DAYS BEFORE ISSUING A FINAL NEGATIVE REPORT     Performed at [facility]   Report Status PENDING   Incomplete  CULTURE, EXPECTORATED SPUTUM-ASSESSMENT     Status: None   Collection Time    09/17/13  8:22 AM      Result Value Range Status   Specimen Description SPUTUM   Final   Special Requests NONE   Final   Sputum evaluation     Final   Value: THIS SPECIMEN IS ACCEPTABLE. RESPIRATORY CULTURE REPORT TO FOLLOW.   Report Status 09/17/2013 FINAL   Final  CULTURE, RESPIRATORY (NON-EXPECTORATED)     Status: None   Collection Time    09/17/13  8:22 AM      Result Value Range Status   Specimen Description SPUTUM   Final   Special Requests NONE   Final   Gram Stain     Final   Value: ABUNDANT WBC PRESENT,BOTH PMN AND MONONUCLEAR     MODERATE SQUAMOUS EPITHELIAL CELLS PRESENT  MODERATE GRAM POSITIVE COCCI     IN PAIRS IN CHAINS FEW GRAM NEGATIVE RODS     Performed at [facility]   Culture     Final   Value: NORMAL OROPHARYNGEAL FLORA     Performed at [facility]   Report Status PENDING   Incomplete    Anti-infectives   Start     Dose/Rate Route Frequency Ordered Stop   09/19/13 1300  vancomycin (VANCOCIN) IVPB 1000 mg/200 mL premix     1,000 mg 200 mL/hr over 60 Minutes Intravenous Every 12 hours 09/19/13 1212 09/25/13 0059   09/17/13 1200  vancomycin (VANCOCIN) IVPB 750 mg/150 ml premix  Status:  Discontinued     750 mg 150 mL/hr over 60 Minutes Intravenous Every 12 hours 09/17/13 0247 09/19/13 1212   09/17/13 0800  ceFEPIme (MAXIPIME) 1 g in dextrose 5 % 50 mL IVPB     1 g 100 mL/hr over 30 Minutes Intravenous Every 8 hours 09/17/13 0224 09/25/13 0759   09/17/13 0000  vancomycin (VANCOCIN) IVPB 750 mg/150 ml premix  Status:  Discontinued     750 mg 150 mL/hr over 60 Minutes Intravenous Every 12 hours 09/16/13 2349 09/17/13 0224   09/16/13 2345  ceFEPIme (MAXIPIME) 1 g  in dextrose 5 % 50 mL IVPB     1 g 100 mL/hr over 30 Minutes Intravenous  Once 09/16/13 2328 09/17/13 0040      Assessment: 77 yoF with recurrent NSCLC currently undergoing radiation therapy presented 10/8 with c/o SOB.  D3/8 Vanc 750 mg IV q12h, Cefepime 1gm IV q8h for multifocal PNA  Vancomycin trough subtherapeutic (9.1)  SCr wnl and stable  WBC rising on steroids  All cultures negative to date  Goal of Therapy:  Vancomycin trough level 15-20 mcg/ml  Plan:   Increase vancomycin to 1g IV q12h  Recheck trough at new steady state if continues  Continue cefepime 1g IV q8h Follow up renal function & cultures, de-escalation of therapy  Loralee Pacas, PharmD, BCPS Pager: [phone] 09/19/2013,12:13 PM

## 2013-09-19 NOTE — Progress Notes (Signed)
Patient more confused, agitated, pulling at IV, tele, and O2 tubing tonight. Paged PA, who ordered Haldol po one time dose. There was no improvement in patient condition. Notify PA again, but no orders were received regarding agitation. Patient has been awake all night; has not slept any, which has required staff to be in room very frequently throughout the night. Patient has also had several loose stools throughout the night. Gave prn dose of Tussionex and placed lavender in room to see if will help her sleep. Will continue to monitor patient. [facility] D

## 2013-09-20 LAB — BASIC METABOLIC PANEL
BUN: 14 mg/dL (ref 6–23)
BUN: 11 mg/dL (ref 6–23)
Calcium: 9.3 mg/dL (ref 8.4–10.5)
Chloride: 97 mEq/L (ref 96–112)
Creatinine, Ser: 0.45 mg/dL — ABNORMAL LOW (ref 0.50–1.10)
Creatinine, Ser: 0.48 mg/dL — ABNORMAL LOW (ref 0.50–1.10)
GFR calc Af Amer: >90 mL/min (ref >90)
GFR calc Af Amer: >90 mL/min (ref >90)
GFR calc non Af Amer: >90 mL/min (ref >90)
GFR calc non Af Amer: >90 mL/min (ref >90)
Glucose, Bld: 123 mg/dL — ABNORMAL HIGH (ref 70–99)
Glucose, Bld: 128 mg/dL — ABNORMAL HIGH (ref 70–99)
Potassium: 4.1 mEq/L (ref 3.5–5.1)
Sodium: 134 mEq/L — ABNORMAL LOW (ref 135–145)

## 2013-09-20 LAB — URINE CULTURE
Colony Count: NO GROWTH
Culture: NO GROWTH

## 2013-09-20 LAB — CBC
MCH: 31.5 pg (ref 26.0–34.0)
MCHC: 33.5 g/dL (ref 30.0–36.0)
Platelets: 307 10*3/uL (ref 150–400)
RDW: 14.4 % (ref 11.5–15.5)

## 2013-09-20 NOTE — Progress Notes (Signed)
[facility] PROGRESS NOTE  Andrea [name] UYQ:[id number] DOB: July 06, 1936 DOA: 09/16/2013 PCP: [name], MD  Assessment/Plan: #1 acute on chronic respiratory failure with hypoxia Secondary to multifocal pneumonia and probable COPD exacerbation. Patient with improvement with wheezing. Sputum Gram stain and cultures pending. Urine Legionella antigen is negative. Urine showed pneumococcus antigen is negative. Blood cultures pending. Continue IV cefepime, Mucinex, nebs. Follow. D/c vancomycin.  #2 probable acute COPD exacerbation Likely secondary to multifocal pneumonia and ongoing tobacco abuse. Clinical improvement. Continue oral stteriods with quick taper secondary to AMS. Continue IV cefepime, oxygen, nebulizer treatments, flutter valve. Follow.  #3. Acute Encephalopathy ?? Etiology. Maybe secondary to steriod psychosis vs hyponatremia vs PNA. Clinical improvement. Head CT negative. TSH WNL. Continue steriod taper. Follow.  #4 healthcare associated pneumonia/multifocal pneumonia  some clinical improvement. Sputum Gram stain and cultures pending. Urine Legionella antigen is negative. Urine pneumococcus antigen is negative. Blood cultures are pending. Continue IV cefepime, Mucinex, scheduled nebulizer treatments, flutter valve. D/c vancomycin  #5 history of left upper lobe lung cancer Patient is status post radiation treatment. Outpatient followup.   #6 hyponatremia Sodium levels now 134. TSH WNL, cortisol level 30.6, CT head neg. Continue IVF. Follow.  #7 hypokalemia Repleted.  #8 prophylaxis Protonix for GI prophylaxis, Lovenox for DVT prophylaxis.  Code Status: Full Family Communication: Updated patient and daughter at bedside. Disposition Plan: Home when medically stable   Consultants:  None  Procedures:  CT chest 09/16/2013  Chest x-ray 09/16/2013  Antibiotics:  IV Vancomycin 09/17/13--->09/20/13  IV CEFEPIME 09/17/13  HPI/Subjective: Patient alert    Objective: Filed Vitals:   09/20/13 1050  BP: 135/42  Pulse: 82  Temp:   Resp:     Intake/Output Summary (Last 24 hours) at 09/20/13 1411 Last data filed at 09/20/13 0900  Gross per 24 hour  Intake   1925 ml  Output   2301 ml  Net   -376 ml   Filed Weights   09/17/13 0218  Weight: 73.2 kg (161 lb 6 oz)    Exam:   General: alert, less confused, ff commands.  Cardiovascular: rrr  Respiratory: Minimal wheezing. No crackles.  Abdomen: Soft/ NT/ND/+BS  Musculoskeletal: No c/c/e  Data Reviewed: Basic Metabolic Panel:  Recent Labs Lab 09/17/13 0253  09/18/13 0830 09/19/13 0830 09/19/13 1805 09/20/13 0514 09/20/13 0830  NA 133*  < > 132* 123* 128* 130* 134*  K 3.5  < > 4.9 4.8 4.7 5.3* 4.1  CL 96  < > 97 90* 92* 97 98  CO2 27  < > 27 25 30 30 30   GLUCOSE 138*  < > 178* 145* 311* 123* 128*  BUN 16  < > 16 14 11 14 11   CREATININE 0.60  < > 0.52 0.44* 0.51 0.45* 0.48*  CALCIUM 9.2  < > 9.6 9.8 9.3 9.2 9.3  MG 1.8  --   --   --   --   --   --   < > = values in this interval not displayed. Liver Function Tests: No results found for this basename: AST, ALT, ALKPHOS, BILITOT, PROT, ALBUMIN,  in the last 168 hours No results found for this basename: LIPASE, AMYLASE,  in the last 168 hours No results found for this basename: AMMONIA,  in the last 168 hours CBC:  Recent Labs Lab 09/16/13 2050 09/17/13 0253 09/18/13 0525 09/19/13 0830 09/20/13 0514  WBC 11.9* 10.8* 13.7* 19.1* 13.6*  HGB 11.7* 11.8* 12.2 11.7* 10.6*  HCT 34.2* 34.8* 36.6 34.0*  31.6*  MCV 93.7 94.1 95.3 91.6 93.8  PLT 205 220 272 301 307   Cardiac Enzymes: No results found for this basename: CKTOTAL, CKMB, CKMBINDEX, TROPONINI,  in the last 168 hours BNP (last 3 results)  Recent Labs  09/16/13 2050  PROBNP 337.9   CBG: No results found for this basename: GLUCAP,  in the last 168 hours  Recent Results (from the past 240 hour(s))  CULTURE, BLOOD (ROUTINE X 2)     Status: None    Collection Time    09/16/13 11:45 PM      Result Value Range Status   Specimen Description BLOOD RIGHT ARM   Final   Special Requests BOTTLES DRAWN AEROBIC AND ANAEROBIC    Final   Culture  Setup Time     Final   Value: 09/17/2013 03:40     Performed at [facility]   Culture     Final   Value:        BLOOD CULTURE RECEIVED NO GROWTH TO DATE CULTURE WILL BE HELD FOR 5 DAYS BEFORE ISSUING A FINAL NEGATIVE REPORT     Performed at [facility]   Report Status PENDING   Incomplete  CULTURE, BLOOD (ROUTINE X 2)     Status: None   Collection Time    09/16/13 11:52 PM      Result Value Range Status   Specimen Description BLOOD LEFT ARM   Final   Special Requests BOTTLES DRAWN AEROBIC AND ANAEROBIC    Final   Culture  Setup Time     Final   Value: 09/17/2013 03:40     Performed at [facility]   Culture     Final   Value:        BLOOD CULTURE RECEIVED NO GROWTH TO DATE CULTURE WILL BE HELD FOR 5 DAYS BEFORE ISSUING A FINAL NEGATIVE REPORT     Performed at [facility]   Report Status PENDING   Incomplete  CULTURE, EXPECTORATED SPUTUM-ASSESSMENT     Status: None   Collection Time    09/17/13  8:22 AM      Result Value Range Status   Specimen Description SPUTUM   Final   Special Requests NONE   Final   Sputum evaluation     Final   Value: THIS SPECIMEN IS ACCEPTABLE. RESPIRATORY CULTURE REPORT TO FOLLOW.   Report Status 09/17/2013 FINAL   Final  CULTURE, RESPIRATORY (NON-EXPECTORATED)     Status: None   Collection Time    09/17/13  8:22 AM      Result Value Range Status   Specimen Description SPUTUM   Final   Special Requests NONE   Final   Gram Stain     Final   Value: ABUNDANT WBC PRESENT,BOTH PMN AND MONONUCLEAR     MODERATE SQUAMOUS EPITHELIAL CELLS PRESENT     MODERATE GRAM POSITIVE COCCI     IN PAIRS IN CHAINS FEW GRAM NEGATIVE RODS     Performed at [facility]   Culture     Final   Value: NORMAL OROPHARYNGEAL FLORA      Performed at [facility]   Report Status 09/19/2013 FINAL   Final     Studies: Ct Head Wo Contrast  09/19/2013   CLINICAL DATA:  Confusion and agitation. History of dementia. History of lung cancer and high blood pressure.  EXAM: CT HEAD WITHOUT CONTRAST  TECHNIQUE: Contiguous axial images were obtained from the base  of the skull through the vertex without intravenous contrast.  COMPARISON:  10/2013 CT.  FINDINGS: No intracranial hemorrhage.  No CT evidence of large acute infarct.  No intracranial mass lesion noted on this unenhanced exam.  Remote left occipital calvarial fracture incompletely healed.  Vascular calcifications.  Opacification left maxillary sinus with air-fluid level. Minimal mucosal thickening right maxillary sinus.  Orbital structures unremarkable.  IMPRESSION: No intracranial hemorrhage.  No CT evidence of large acute infarct.  No intracranial mass lesion noted on this unenhanced exam.  Remote left occipital calvarial fracture incompletely healed.  Opacification left maxillary sinus with air-fluid level. Minimal mucosal thickening right maxillary sinus.   Electronically Signed   By: Bridgett Larsson M.D.   On: 09/19/2013 15:33    Scheduled Meds: . acetaminophen  500 mg Oral BID  . aspirin EC  81 mg Oral q morning - 10a  . ceFEPime (MAXIPIME) IV  1 g Intravenous Q8H  . cholecalciferol  2,000 Units Oral q morning - 10a  . enoxaparin (LOVENOX) injection  40 mg Subcutaneous Q24H  . galantamine  8 mg Oral BID WC  . guaiFENesin  1,200 mg Oral BID  . ibuprofen  200 mg Oral BID  . ipratropium  0.5 mg Nebulization Q6H  . levalbuterol  0.63 mg Nebulization Q6H  . losartan  50 mg Oral q morning - 10a  . memantine  10 mg Oral BID  . mometasone-formoterol  2 puff Inhalation BID  . multivitamin with minerals  1 tablet Oral q morning - 10a  . nicotine  21 mg Transdermal Daily  . pantoprazole  40 mg Oral q morning - 10a  . predniSONE  40 mg Oral QAC breakfast  . roflumilast  500  mcg Oral q morning - 10a  . simvastatin  10 mg Oral q1800  . sodium chloride  3 mL Intravenous Q12H  . vancomycin  1,000 mg Intravenous Q12H   Continuous Infusions: . sodium chloride 75 mL/hr at 09/20/13 0754    Principal Problem:   Acute respiratory failure with hypoxia Active Problems:   Hypertension   COPD  GOLD II   Lung cancer   Leukocytosis   Pneumonia   Hyponatremia   Hypokalemia   COPD exacerbation   Acute encephalopathy    Time spent: > 35 MINS    [facility]  [facility] Pager [phone]. If 7PM-7AM, please contact night-coverage at [email or web address], password [facility] 09/20/2013, 2:11 PM  LOS: 4 days

## 2013-09-21 DIAGNOSIS — J9620 Acute and chronic respiratory failure, unspecified whether with hypoxia or hypercapnia: Secondary | ICD-10-CM

## 2013-09-21 LAB — BASIC METABOLIC PANEL
CO2: 31 mEq/L (ref 19–32)
Calcium: 9.2 mg/dL (ref 8.4–10.5)
Chloride: 97 mEq/L (ref 96–112)
Creatinine, Ser: 0.48 mg/dL — ABNORMAL LOW (ref 0.50–1.10)
Glucose, Bld: 97 mg/dL (ref 70–99)

## 2013-09-21 MED ORDER — LEVOFLOXACIN 750 MG PO TABS
750.0000 mg | ORAL_TABLET | Freq: Every day | ORAL | Status: DC
  Administered 2013-09-21 – 2013-09-22 (×2): 750 mg via ORAL
  Filled 2013-09-21 (×2): qty 1

## 2013-09-21 NOTE — Progress Notes (Signed)
Physical Therapy Treatment Patient Details Name: OVELLA MANYGOATS MRN: [id number] DOB: 1936-10-21 Today's Date: 09/21/2013 Time: 6578-4696 PT Time Calculation (min): 28 min  PT Assessment / Plan / Recommendation  History of Present Illness  is 77 yo female with LUL lung cancer, recently completed radiation therapy, who presents to [facility] ED with main concern of progressively worsening shortness of breath that initially started several days prior to this admission and was present with exertion initially but now also at rest, associated with subjective fevers, chills, productive cough of yellow sputum. Pt denies similar events in the past, denies any specific alleviating or aggravating factors. Pt denies chest pain other than the one present with cough, she denies any specific abdominal or urinary concerns   PT Comments   Pt in bed with sitter in room.  Assisted OOB to [facility] to void then amb in hallway.  Pt tolerated session well.  Follow Up Recommendations  Home health PT/ALF     Does the patient have the potential to tolerate intense rehabilitation     Barriers to Discharge        Equipment Recommendations       Recommendations for Other Services    Frequency Min 3X/week   Progress towards PT Goals Progress towards PT goals: Progressing toward goals  Plan      Precautions / Restrictions Precautions Precautions: Fall Precaution Comments: monitor sats Restrictions Weight Bearing Restrictions: No    Pertinent Vitals/Pain No c/o pain    Mobility  Bed Mobility Bed Mobility: Supine to Sit Supine to Sit: 4: Min guard Details for Bed Mobility Assistance: extra time for activity. Transfers Transfers: Sit to Stand;Stand to Sit Sit to Stand: 4: Min guard;5: Supervision;From bed;From chair/3-in-1 Stand to Sit: 5: Supervision;4: Min guard;To chair/3-in-1 Details for Transfer Assistance: 50% VC's on proper hand placement as pt cont to pull up on walker.  Ambulation/Gait Ambulation/Gait  Assistance: 4: Min guard;4: Min Environmental consultant (Feet): 120 Feet Assistive device: Rolling walker Ambulation/Gait Assistance Details: 50% VC's on proper purse lip breathing and proper walker to self distance.   Recliner following for safety as pt demon limited activity tolerance.  Amb on 2 lts O2 sats avg 92% with HR 112 Gait Pattern: Step-to pattern    PT Goals (current goals can now be found in the care plan section)    Visit Information  Last PT Received On: 09/21/13 Assistance Needed: +1 History of Present Illness:  is 77 yo female with LUL lung cancer, recently completed radiation therapy, who presents to [facility] ED with main concern of progressively worsening shortness of breath that initially started several days prior to this admission and was present with exertion initially but now also at rest, associated with subjective fevers, chills, productive cough of yellow sputum. Pt denies similar events in the past, denies any specific alleviating or aggravating factors. Pt denies chest pain other than the one present with cough, she denies any specific abdominal or urinary concerns    Subjective Data      Cognition       Balance     End of Session PT - End of Session Equipment Utilized During Treatment: Gait belt;Oxygen Activity Tolerance: Patient tolerated treatment well Patient left: in chair;with call bell/phone within reach;with chair alarm set;with nursing/sitter in room   Felecia Shelling  PTA [facility]  Acute  Rehab Pager      [phone]

## 2013-09-21 NOTE — Progress Notes (Signed)
ANTIBIOTIC CONSULT NOTE - FOLLOW UP  Pharmacy Consult for Levaquin Indication: pneumonia  No Known Allergies  Patient Measurements: Height: 5\' 1"  (154.9 cm) Weight: 161 lb 6 oz (73.2 kg) IBW/kg (Calculated) : 47.8  Vital Signs: Temp: 98.1 F (36.7 C) (10/13 0300) Temp src: Oral (10/13 0300) BP: 135/47 mmHg (10/13 0300) Pulse Rate: 84 (10/13 0300) Intake/Output from previous day: 10/12 0701 - 10/13 0700 In: 2940.5 [P.O.:650; I.V.:1540.5; IV Piggyback:750] Out: 1182 [Urine:1178; Stool:4]  Labs:  Recent Labs  09/19/13 0830 09/19/13 1358  09/20/13 0514 09/20/13 0830 09/21/13 0530  WBC 19.1*  --   --  13.6*  --   --   HGB 11.7*  --   --  10.6*  --   --   PLT 301  --   --  307  --   --   LABCREA  --  14.5  --   --   --   --   CREATININE 0.44*  --   < > 0.45* 0.48* 0.48*  < > = values in this interval not displayed. Estimated Creatinine Clearance: 55.6 ml/min (by C-G formula based on Cr of 0.48).  69 ml/min/1.5m2 (normalized)    Recent Labs  09/19/13 1122  VANCOTROUGH 9.1*     Microbiology: 10/8 blood x 2: ngtd 10/9 urine strep/legionalla: neg 10/9 sputum: normal flora 10/11 urine: NGF  Anti-infectives: 10/9 >> Vanc >> 10/13 10/9 >> Cefepime >> 10/13 10/13 >> Levaquin >>  Assessment: 20 yoF with recurrent NSCLC currently undergoing radiation therapy presented 10/8 with c/o SOB.  Pharmacy initially asked to dose vancomycin and cefepime, but changed to Levaquin PO on 10/13.  D5 Vanc and Cefepime, change to PO levaquin today  SCr wnl and stable  WBC rising on steroids  All cultures negative to date  Goal of Therapy:  Vancomycin trough level 15-20 mcg/ml  Plan:  Levaquin 750mg  PO q24h x4 doses to complete a total of 8 days. Follow up renal function & cultures.   Lynann Beaver PharmD, BCPS Pager [phone] 09/21/2013 10:45 AM

## 2013-09-21 NOTE — Progress Notes (Signed)
[facility] PROGRESS NOTE  Andrea [name] AOZ:[id number] DOB: 1936-10-17 DOA: 09/16/2013 PCP: [name], MD  Assessment/Plan: #1 acute on chronic respiratory failure with hypoxia Secondary to multifocal pneumonia and probable COPD exacerbation. Patient with improvement with wheezing. Sputum Gram stain and cultures negative. Urine Legionella antigen is negative. Urine showed pneumococcus antigen is negative. Blood cultures pending. D/c IV cefepime, Mucinex, nebs. Follow. Start oral Levaquin.   #2 probable acute COPD exacerbation Likely secondary to multifocal pneumonia and ongoing tobacco abuse. Clinical improvement. Continue oral steriods with quick taper secondary to AMS. D/C IV cefepime. Continue oxygen, nebulizer treatments, flutter valve. WIll place on oral Levaquin Follow.  #3. Acute Encephalopathy ?? Etiology. Maybe secondary to steriod psychosis vs sundowning vs PNA. Clinical improvement. Head CT negative. TSH WNL. Continue steriod taper. Continue treatment for pneumonia. Follow.  #4 healthcare associated pneumonia/multifocal pneumonia  some clinical improvement. Sputum Gram stain and cultures negative. Urine Legionella antigen is negative. Urine pneumococcus antigen is negative. Blood cultures are pending. D/C IV cefepime, Mucinex, scheduled nebulizer treatments, flutter valve. Start oral Levaquin.  #5 history of left upper lobe lung cancer Patient is status post radiation treatment. Outpatient followup.   #6 hyponatremia Sodium levels now 133. TSH WNL, cortisol level 30.6, CT head neg. nsl IVF. Follow.  #7 hypokalemia Repleted.  #8 prophylaxis Protonix for GI prophylaxis, Lovenox for DVT prophylaxis.  Code Status: Full Family Communication: Updated patient and daughter at bedside. Disposition Plan: Home when medically stable   Consultants:  None  Procedures:  CT chest 09/16/2013  Chest x-ray 09/16/2013  Antibiotics:  IV Vancomycin  09/17/13--->09/20/13  IV CEFEPIME 09/17/13---> 09/21/13  Oral Levaquin 09/21/2013  HPI/Subjective: Patient alert. Following commands. No complaints.  Objective: Filed Vitals:   09/21/13 0300  BP: 135/47  Pulse: 84  Temp: 98.1 F (36.7 C)  Resp: 20    Intake/Output Summary (Last 24 hours) at 09/21/13 1352 Last data filed at 09/21/13 1059  Gross per 24 hour  Intake 2570.5 ml  Output   2556 ml  Net   14.5 ml   Filed Weights   09/17/13 0218  Weight: 73.2 kg (161 lb 6 oz)    Exam:   General: alert, less confused, ff commands.  Cardiovascular: rrr  Respiratory: Minimal wheezing. No crackles.  Abdomen: Soft/ NT/ND/+BS  Musculoskeletal: No c/c/e  Data Reviewed: Basic Metabolic Panel:  Recent Labs Lab 09/17/13 0253  09/19/13 0830 09/19/13 1805 09/20/13 0514 09/20/13 0830 09/21/13 0530  NA 133*  < > 123* 128* 130* 134* 133*  K 3.5  < > 4.8 4.7 5.3* 4.1 3.7  CL 96  < > 90* 92* 97 98 97  CO2 27  < > 25 30 30 30 31   GLUCOSE 138*  < > 145* 311* 123* 128* 97  BUN 16  < > 14 11 14 11 9   CREATININE 0.60  < > 0.44* 0.51 0.45* 0.48* 0.48*  CALCIUM 9.2  < > 9.8 9.3 9.2 9.3 9.2  MG 1.8  --   --   --   --   --   --   < > = values in this interval not displayed. Liver Function Tests: No results found for this basename: AST, ALT, ALKPHOS, BILITOT, PROT, ALBUMIN,  in the last 168 hours No results found for this basename: LIPASE, AMYLASE,  in the last 168 hours No results found for this basename: AMMONIA,  in the last 168 hours CBC:  Recent Labs Lab 09/16/13 2050 09/17/13 0253 09/18/13 0525 09/19/13 0830  09/20/13 0514  WBC 11.9* 10.8* 13.7* 19.1* 13.6*  HGB 11.7* 11.8* 12.2 11.7* 10.6*  HCT 34.2* 34.8* 36.6 34.0* 31.6*  MCV 93.7 94.1 95.3 91.6 93.8  PLT 205 220 272 301 307   Cardiac Enzymes: No results found for this basename: CKTOTAL, CKMB, CKMBINDEX, TROPONINI,  in the last 168 hours BNP (last 3 results)  Recent Labs  09/16/13 2050  PROBNP 337.9    CBG: No results found for this basename: GLUCAP,  in the last 168 hours  Recent Results (from the past 240 hour(s))  CULTURE, BLOOD (ROUTINE X 2)     Status: None   Collection Time    09/16/13 11:45 PM      Result Value Range Status   Specimen Description BLOOD RIGHT ARM   Final   Special Requests BOTTLES DRAWN AEROBIC AND ANAEROBIC    Final   Culture  Setup Time     Final   Value: 09/17/2013 03:40     Performed at [facility]   Culture     Final   Value:        BLOOD CULTURE RECEIVED NO GROWTH TO DATE CULTURE WILL BE HELD FOR 5 DAYS BEFORE ISSUING A FINAL NEGATIVE REPORT     Performed at [facility]   Report Status PENDING   Incomplete  CULTURE, BLOOD (ROUTINE X 2)     Status: None   Collection Time    09/16/13 11:52 PM      Result Value Range Status   Specimen Description BLOOD LEFT ARM   Final   Special Requests BOTTLES DRAWN AEROBIC AND ANAEROBIC    Final   Culture  Setup Time     Final   Value: 09/17/2013 03:40     Performed at [facility]   Culture     Final   Value:        BLOOD CULTURE RECEIVED NO GROWTH TO DATE CULTURE WILL BE HELD FOR 5 DAYS BEFORE ISSUING A FINAL NEGATIVE REPORT     Performed at [facility]   Report Status PENDING   Incomplete  CULTURE, EXPECTORATED SPUTUM-ASSESSMENT     Status: None   Collection Time    09/17/13  8:22 AM      Result Value Range Status   Specimen Description SPUTUM   Final   Special Requests NONE   Final   Sputum evaluation     Final   Value: THIS SPECIMEN IS ACCEPTABLE. RESPIRATORY CULTURE REPORT TO FOLLOW.   Report Status 09/17/2013 FINAL   Final  CULTURE, RESPIRATORY (NON-EXPECTORATED)     Status: None   Collection Time    09/17/13  8:22 AM      Result Value Range Status   Specimen Description SPUTUM   Final   Special Requests NONE   Final   Gram Stain     Final   Value: ABUNDANT WBC PRESENT,BOTH PMN AND MONONUCLEAR     MODERATE SQUAMOUS EPITHELIAL CELLS PRESENT     MODERATE  GRAM POSITIVE COCCI     IN PAIRS IN CHAINS FEW GRAM NEGATIVE RODS     Performed at [facility]   Culture     Final   Value: NORMAL OROPHARYNGEAL FLORA     Performed at [facility]   Report Status 09/19/2013 FINAL   Final  URINE CULTURE     Status: None   Collection Time    09/19/13  1:58 PM  Result Value Range Status   Specimen Description URINE, CLEAN CATCH   Final   Special Requests NONE   Final   Culture  Setup Time     Final   Value: 09/19/2013 20:47     Performed at [facility]   Colony Count     Final   Value: NO GROWTH     Performed at [facility]   Culture     Final   Value: NO GROWTH     Performed at [facility]   Report Status 09/20/2013 FINAL   Final     Studies: Ct Head Wo Contrast  09/19/2013   CLINICAL DATA:  Confusion and agitation. History of dementia. History of lung cancer and high blood pressure.  EXAM: CT HEAD WITHOUT CONTRAST  TECHNIQUE: Contiguous axial images were obtained from the base of the skull through the vertex without intravenous contrast.  COMPARISON:  10/2013 CT.  FINDINGS: No intracranial hemorrhage.  No CT evidence of large acute infarct.  No intracranial mass lesion noted on this unenhanced exam.  Remote left occipital calvarial fracture incompletely healed.  Vascular calcifications.  Opacification left maxillary sinus with air-fluid level. Minimal mucosal thickening right maxillary sinus.  Orbital structures unremarkable.  IMPRESSION: No intracranial hemorrhage.  No CT evidence of large acute infarct.  No intracranial mass lesion noted on this unenhanced exam.  Remote left occipital calvarial fracture incompletely healed.  Opacification left maxillary sinus with air-fluid level. Minimal mucosal thickening right maxillary sinus.   Electronically Signed   By: Bridgett Larsson M.D.   On: 09/19/2013 15:33    Scheduled Meds: . acetaminophen  500 mg Oral BID  . aspirin EC  81 mg Oral q morning - 10a  .  cholecalciferol  2,000 Units Oral q morning - 10a  . enoxaparin (LOVENOX) injection  40 mg Subcutaneous Q24H  . galantamine  8 mg Oral BID WC  . guaiFENesin  1,200 mg Oral BID  . ibuprofen  200 mg Oral BID  . ipratropium  0.5 mg Nebulization Q6H  . levalbuterol  0.63 mg Nebulization Q6H  . levofloxacin  750 mg Oral Daily  . losartan  50 mg Oral q morning - 10a  . memantine  10 mg Oral BID  . mometasone-formoterol  2 puff Inhalation BID  . multivitamin with minerals  1 tablet Oral q morning - 10a  . nicotine  21 mg Transdermal Daily  . pantoprazole  40 mg Oral q morning - 10a  . predniSONE  40 mg Oral QAC breakfast  . roflumilast  500 mcg Oral q morning - 10a  . simvastatin  10 mg Oral q1800  . sodium chloride  3 mL Intravenous Q12H   Continuous Infusions: . sodium chloride 75 mL/hr at 09/21/13 1610    Principal Problem:   Acute-on-chronic respiratory failure Active Problems:   Hypertension   COPD  GOLD II   Lung cancer   Acute respiratory failure with hypoxia   Leukocytosis   Pneumonia   Hyponatremia   Hypokalemia   COPD exacerbation   Acute encephalopathy    Time spent: > 35 MINS    [facility]  [facility] Pager [phone]. If 7PM-7AM, please contact night-coverage at [email or web address], password [facility] 09/21/2013, 1:52 PM  LOS: 5 days

## 2013-09-22 LAB — BASIC METABOLIC PANEL
BUN: 12 mg/dL (ref 6–23)
Calcium: 9.7 mg/dL (ref 8.4–10.5)
Creatinine, Ser: 0.53 mg/dL (ref 0.50–1.10)
GFR calc non Af Amer: >90 mL/min (ref >90)
Glucose, Bld: 111 mg/dL — ABNORMAL HIGH (ref 70–99)
Sodium: 134 mEq/L — ABNORMAL LOW (ref 135–145)

## 2013-09-22 MED ORDER — ALBUTEROL SULFATE (2.5 MG/3ML) 0.083% IN NEBU: 2.5000 mg | INHALATION_SOLUTION | RESPIRATORY_TRACT | Status: DC | PRN

## 2013-09-22 MED ORDER — LEVOFLOXACIN 750 MG PO TABS: 750.0000 mg | ORAL_TABLET | Freq: Every day | ORAL | Status: DC

## 2013-09-22 MED ORDER — GUAIFENESIN ER 600 MG PO TB12: 1200.0000 mg | ORAL_TABLET | Freq: Two times a day (BID) | ORAL | Status: DC

## 2013-09-22 MED ORDER — PREDNISONE 20 MG PO TABS: 20.0000 mg | ORAL_TABLET | Freq: Every day | ORAL | Status: DC

## 2013-09-22 NOTE — Discharge Summary (Signed)
Physician Discharge Summary  DINAH LUPA ZOX:[id number] DOB: [birth year] DOA: 09/16/2013  PCP: [name], MD  Admit date: 09/16/2013 Discharge date: 09/22/2013  Time spent: 65 minutes  Recommendations for Outpatient Follow-up:  1. Patient is to followup with [name], MD one week post discharge. On followup a basic metabolic profile will need to be obtained to followup on patient's electrolytes and renal function. Patient's pneumonia and copd will need to be followed up on. 2. Patient is to followup with Dr. Sherene Sires of [facility] on 10/01/2013 for followup on the hospitalization for acute COPD exacerbation and multifocal pneumonia which led to acute on chronic respiratory failure.   Discharge Diagnoses:  Principal Problem:   Acute-on-chronic respiratory failure Active Problems:   Hypertension   COPD  GOLD II   Lung cancer   Acute respiratory failure with hypoxia   Leukocytosis   Pneumonia   Hyponatremia   Hypokalemia   COPD exacerbation   Acute encephalopathy   Discharge Condition: Stable and improved  Diet recommendation: Regular  Filed Weights   09/17/13 0218  Weight: 73.2 kg (161 lb 6 oz)    History of present illness:  Pt is 77 yo female with LUL lung cancer, recently completed radiation therapy, who presents to [facility] ED with main concern of progressively worsening shortness of breath that initially started several days prior to this admission and was present with exertion initially but now also at rest, associated with subjective fevers, chills, productive cough of yellow sputum. Pt denies similar events in the past, denies any specific alleviating or aggravating factors. Pt denies chest pain other than the one present with cough, she denies any specific abdominal or urinary concerns.  In ED, pt found to be hypoxic with oxygen saturation in 80's on RA that has quickly improved to mid 90's with 2-4 L [place], CXR and CT chest consistent with multifocal PNA. TSH  asked to admit for further evaluation.    Hospital Course:  #1 acute on chronic respiratory failure with hypoxia  Patient was admitted with acute on chronic resp failure felt to be secondary to multifocal pneumonia (per CXR) and probable COPD exacerbation. Patient was placed on telemetry and started empirically on IV vancomycin and cefepime. Patient was also started on IV steroids, oxygen, nebulizer treatments, Mucinex. Sputum Gram stain and culture were obtained which came back negative. Urine Legionella antigen was negative. Urine strep pneumococcus antigen is negative. Blood cultures were obtained with no growth to date. Patient improved clinically antibiotic coverage was narrowed and IV vancomycin discontinued and subsequently IV cefepime. Patient was subsequently transitioned to Levaquin which she tolerated. Steroids were tapered. Patient improved clinically and was back to baseline by day of discharge. Patient be discharged on 2 more days of oral Levaquin to complete a day course of antibiotic therapy. Patient will followup with PCP one week post discharge. Patient will also followup with Dr. Sherene Sires of pulmonary 10/01/2013. The patient will be discharged in stable and improved condition.  #2 probable acute COPD exacerbation  On admission patient was noted to have an acute COPD exacerbation. Likely secondary to multifocal pneumonia and ongoing tobacco abuse. Patient was placed empirically on IV antibiotics for pneumonia, also started on IV steroids, nebulizer treatments, oxygen. Patient improved clinically patient's steroids were tapered quickly as patient got confused and was thought to might have a steroid psychosis. Patient improved clinically antibiotics were narrowed down and patient be discharged home on 2 more days of oral Levaquin. Patient was close to baseline by day  of discharge. Patient be discharged in stable and improved condition. #3. Acute Encephalopathy  During the hospitalization patient  was noted to have some bouts of confusion at night. It was felt maybe secondary to steroid psychosis versus sundowning versus secondary to acute infection. Patient's steroids were tapered down quickly. Patient improved clinically and was at baseline during the daytime. Head CT which was obtained was negative. TSH was obtained was within normal limits. Patient was treated with antibiotics for pneumonia. Patient be discharged home in stable and improved condition. Patient was at baseline by day of discharge. #4 healthcare associated pneumonia/multifocal pneumonia  On admission patient was noted to have a multifocal pneumonia per chest x-ray and presentation. Patient was admitted to telemetry. Sputum Gram stain and culture which were obtained were negative. Urine Legionella antigen was negative. Urine pneumococcus antigen was negative. Blood cultures with no growth to date. Patient was placed empirically on IV vancomycin and IV cefepime, Mucinex, nebulizer treatments, oxygen, supportive care. Patient improved clinically. IV antibiotics were narrowed down to Levaquin which patient tolerated. Patient be discharged home on 2 more days of oral Levaquin to complete an eight-day course of antibiotic therapy.   #5 history of left upper lobe lung cancer  Patient is status post radiation treatment. Outpatient followup.  #6 hyponatremia  Patient was noted to have a hyponatremia with sodium going down as low as 123. TSH WNL, cortisol level 30.6, CT head neg. patient was initially hydrated with IV fluids with improvement in her hyponatremia. IV fluids were subsequently discontinued. Patient's sodium level was back to her baseline by day of discharge. Sodium on day of discharge was 134.  #7 hypokalemia  Repleted.      Procedures: CT chest 09/16/2013  Chest x-ray 09/16/2013   Consultations:  None  Discharge Exam: Filed Vitals:   09/22/13 0524  BP: 123/51  Pulse: 80  Temp: 98.3 F (36.8 C)  Resp: 18     General: nad Cardiovascular: rrr Respiratory: ctab  Discharge Instructions      Discharge Orders   Future Appointments Provider Department Dept Phone   10/01/2013 1:45 PM Nyoka Cowden, MD [facility] [phone]   10/08/2013 11:40 AM Billie Lade, MD [facility] [phone]   11/24/2013 10:00 AM [facility] [place] [facility] [phone]   11/26/2013 10:00 AM [name], MD [facility] [phone]   Future Orders Complete By Expires   Diet general  As directed    Discharge instructions  As directed    Comments:     Follow up with Rodrigo Ran A, MD in 1 week. Follow up with Dr Sherene Sires, 10/01/13 Thursday at 145pm STOP SMOKING PLEASE.   Increase activity slowly  As directed        Medication List         acetaminophen 500 MG tablet  Commonly known as:  TYLENOL  Take 500 mg by mouth 2 (two) times daily.     albuterol 108 (90 BASE) MCG/ACT inhaler  Commonly known as:  PROVENTIL HFA;VENTOLIN HFA  Inhale 2 puffs into the lungs every 4 (four) hours as needed for shortness of breath.     albuterol (2.5 MG/3ML) 0.083% nebulizer solution  Commonly known as:  PROVENTIL  Take 3 mLs (2.5 mg total) by nebulization every 4 (four) hours as needed for shortness of breath. Use nebs every 8 hours x 3 days then use as needed.     alendronate 70 MG  tablet  Commonly known as:  FOSAMAX  Take 70 mg by mouth every 7 (seven) days. Sundays     aspirin EC 81 MG tablet  Take 81 mg by mouth every morning.     BIAFINE WOUND DRESSING EX  Apply topically.     cholecalciferol 1000 UNITS tablet  Commonly known as:  VITAMIN D  Take 2,000 Units by mouth every morning.     Fish Oil 1200 MG Caps  Take 1 capsule by mouth every morning.     Fluticasone-Salmeterol 250-50 MCG/DOSE Aepb  Commonly known as:  ADVAIR  Inhale 1 puff into the lungs every 12 (twelve) hours.      galantamine 16 MG 24 hr capsule  Commonly known as:  RAZADYNE ER  Take 16 mg by mouth every morning.     guaiFENesin 600 MG 12 hr tablet  Commonly known as:  MUCINEX  Take 2 tablets (1,200 mg total) by mouth 2 (two) times daily. Use for 3 days then as needed.     HYDROcodone-acetaminophen 5-325 MG per tablet  Commonly known as:  NORCO/VICODIN  Take 1 tablet by mouth every 6 (six) hours as needed for pain.     ibuprofen 200 MG tablet  Commonly known as:  ADVIL,MOTRIN  Take 200 mg by mouth 2 (two) times daily.     levofloxacin 750 MG tablet  Commonly known as:  LEVAQUIN  Take 1 tablet (750 mg total) by mouth daily. Take for 2 days then stop.  Start taking on:  09/23/2013     losartan 50 MG tablet  Commonly known as:  COZAAR  Take 50 mg by mouth every morning.     lovastatin 40 MG tablet  Commonly known as:  MEVACOR  Take 40 mg by mouth every morning.     memantine 10 MG tablet  Commonly known as:  NAMENDA  Take 10 mg by mouth 2 (two) times daily.     multivitamin with minerals Tabs tablet  Take 1 tablet by mouth every morning.     predniSONE 20 MG tablet  Commonly known as:  DELTASONE  Take 1 tablet (20 mg total) by mouth daily. Take 1 tablet daily x 2 days then stop.  Start taking on:  09/23/2013     roflumilast 500 MCG Tabs tablet  Commonly known as:  DALIRESP  Take 500 mcg by mouth every morning.     tiotropium 18 MCG inhalation capsule  Commonly known as:  SPIRIVA  Place 18 mcg into inhaler and inhale daily.       No Known Allergies Follow-up Information   Schedule an appointment as soon as possible for a visit with [name], MD.   Specialty:  Internal Medicine   Contact information:   [address] [place] [address] [phone]       Follow up with Sandrea Hughs, MD On 10/01/2013. (F/U AT 1:45PM)    Specialty:  Pulmonary Disease   Contact information:   [address] [place] [address] [phone]        The results of  significant diagnostics from this hospitalization (including imaging, microbiology, ancillary and laboratory) are listed below for reference.    Significant Diagnostic Studies: Dg Chest 2 View (if Patient Has Fever And/or Copd)  09/16/2013   *RADIOLOGY REPORT*  Clinical Data: Shortness of breath, cough and weakness.  CHEST - 2 VIEW  Comparison: Chest radiograph performed 08/05/2013  Findings: The lungs are well-aerated.  Mild vascular congestion is noted.  Mild  bibasilar airspace opacities likely reflect atelectasis.  There is no evidence of pleural effusion or pneumothorax.  The heart is normal in size; the mediastinal contour is within normal limits.  No acute osseous abnormalities are seen.  IMPRESSION: Mild vascular congestion noted; mild bibasilar airspace opacities likely reflect atelectasis.   Original Report Authenticated By: Tonia Ghent, M.D.   Ct Head Wo Contrast  09/19/2013   CLINICAL DATA:  Confusion and agitation. History of dementia. History of lung cancer and high blood pressure.  EXAM: CT HEAD WITHOUT CONTRAST  TECHNIQUE: Contiguous axial images were obtained from the base of the skull through the vertex without intravenous contrast.  COMPARISON:  10/2013 CT.  FINDINGS: No intracranial hemorrhage.  No CT evidence of large acute infarct.  No intracranial mass lesion noted on this unenhanced exam.  Remote left occipital calvarial fracture incompletely healed.  Vascular calcifications.  Opacification left maxillary sinus with air-fluid level. Minimal mucosal thickening right maxillary sinus.  Orbital structures unremarkable.  IMPRESSION: No intracranial hemorrhage.  No CT evidence of large acute infarct.  No intracranial mass lesion noted on this unenhanced exam.  Remote left occipital calvarial fracture incompletely healed.  Opacification left maxillary sinus with air-fluid level. Minimal mucosal thickening right maxillary sinus.   Electronically Signed   By: Bridgett Larsson M.D.   On:  09/19/2013 15:33   Ct Angio Chest Pe W/cm &/or Wo Cm  09/16/2013   *RADIOLOGY REPORT*  Clinical Data: Worsening shortness of breath.  History of lung cancer.  CT ANGIOGRAPHY CHEST  Technique:  Multidetector CT imaging of the chest using the standard protocol during bolus administration of intravenous contrast. Multiplanar reconstructed images including MIPs were obtained and reviewed to evaluate the vascular anatomy.  Contrast: OMNIPAQUE IOHEXOL 350 MG/ML SOLN  Comparison: Chest radiograph September 16, 2013 and PET CT July 20, 2013  Findings: Main pulmonary artery is not enlarged. Surgical clips at the aortopulmonary window resultant some streak artifact limiting evaluation of the pulmonary artery at this level.  Truncation the left pulmonary artery most consistent with prior pneumonectomy. The evaluation of the subsegmental branches within the lower lobes due to patient motion.  Tree in bud appearance of the left upper lobe, new from prior examination.  Tree in bud appearance of the right greater than left lower lobes, new from prior examination. No pleural effusions.  Again seen is focal chest wall soft tissue mass at the level of the left sixth and seventh ribs.  No parenchymal mass.  Tracheobronchial tree is patent and midline.  No pneumothorax.  The heart and pericardium are not suspicious.  Moderate calcific atherosclerosis of the aortic arch, no aorta is overall normal in course and caliber.  Coarse calcification in the left breast, unchanged from prior PET CT.  Mild nodular thickening left adrenal gland, similar.  IMPRESSION: Motion degraded examination, lung bases poorly visualized.  No convincing evidence of pulmonary embolism to the level of the segmental branches.  Status post partial left pneumonectomy.  New small airway disease within the left upper lobe, bilateral lower lobes concerning for multifocal pneumonia.  Redemonstration of left chest wall mass, please see CT chest biopsy results  August 05, 2013 for further description.   Original Report Authenticated By: Awilda Metro    Microbiology: Recent Results (from the past 240 hour(s))  CULTURE, BLOOD (ROUTINE X 2)     Status: None   Collection Time    09/16/13 11:45 PM      Result Value Range Status   Specimen  Description BLOOD RIGHT ARM   Final   Special Requests BOTTLES DRAWN AEROBIC AND ANAEROBIC    Final   Culture  Setup Time     Final   Value: 09/17/2013 03:40     Performed at [facility]   Culture     Final   Value:        BLOOD CULTURE RECEIVED NO GROWTH TO DATE CULTURE WILL BE HELD FOR 5 DAYS BEFORE ISSUING A FINAL NEGATIVE REPORT     Performed at [facility]   Report Status PENDING   Incomplete  CULTURE, BLOOD (ROUTINE X 2)     Status: None   Collection Time    09/16/13 11:52 PM      Result Value Range Status   Specimen Description BLOOD LEFT ARM   Final   Special Requests BOTTLES DRAWN AEROBIC AND ANAEROBIC    Final   Culture  Setup Time     Final   Value: 09/17/2013 03:40     Performed at [facility]   Culture     Final   Value:        BLOOD CULTURE RECEIVED NO GROWTH TO DATE CULTURE WILL BE HELD FOR 5 DAYS BEFORE ISSUING A FINAL NEGATIVE REPORT     Performed at [facility]   Report Status PENDING   Incomplete  CULTURE, EXPECTORATED SPUTUM-ASSESSMENT     Status: None   Collection Time    09/17/13  8:22 AM      Result Value Range Status   Specimen Description SPUTUM   Final   Special Requests NONE   Final   Sputum evaluation     Final   Value: THIS SPECIMEN IS ACCEPTABLE. RESPIRATORY CULTURE REPORT TO FOLLOW.   Report Status 09/17/2013 FINAL   Final  CULTURE, RESPIRATORY (NON-EXPECTORATED)     Status: None   Collection Time    09/17/13  8:22 AM      Result Value Range Status   Specimen Description SPUTUM   Final   Special Requests NONE   Final   Gram Stain     Final   Value: ABUNDANT WBC PRESENT,BOTH PMN AND MONONUCLEAR     MODERATE SQUAMOUS  EPITHELIAL CELLS PRESENT     MODERATE GRAM POSITIVE COCCI     IN PAIRS IN CHAINS FEW GRAM NEGATIVE RODS     Performed at [facility]   Culture     Final   Value: NORMAL OROPHARYNGEAL FLORA     Performed at [facility]   Report Status 09/19/2013 FINAL   Final  URINE CULTURE     Status: None   Collection Time    09/19/13  1:58 PM      Result Value Range Status   Specimen Description URINE, CLEAN CATCH   Final   Special Requests NONE   Final   Culture  Setup Time     Final   Value: 09/19/2013 20:47     Performed at [facility] Count     Final   Value: NO GROWTH     Performed at [facility]   Culture     Final   Value: NO GROWTH     Performed at [facility]   Report Status 09/20/2013 FINAL   Final     Labs: Basic Metabolic Panel:  Recent Labs Lab 09/17/13 0253  09/19/13 1805 09/20/13 0514 09/20/13 0830 09/21/13 0530 09/22/13 0600  NA  133*  < > 128* 130* 134* 133* 134*  K 3.5  < > 4.7 5.3* 4.1 3.7 3.6  CL 96  < > 92* 97 98 97 96  CO2 27  < > 30 30 30 31 31   GLUCOSE 138*  < > 311* 123* 128* 97 111*  BUN 16  < > 11 14 11 9 12   CREATININE 0.60  < > 0.51 0.45* 0.48* 0.48* 0.53  CALCIUM 9.2  < > 9.3 9.2 9.3 9.2 9.7  MG 1.8  --   --   --   --   --   --   < > = values in this interval not displayed. Liver Function Tests: No results found for this basename: AST, ALT, ALKPHOS, BILITOT, PROT, ALBUMIN,  in the last 168 hours No results found for this basename: LIPASE, AMYLASE,  in the last 168 hours No results found for this basename: AMMONIA,  in the last 168 hours CBC:  Recent Labs Lab 09/16/13 2050 09/17/13 0253 09/18/13 0525 09/19/13 0830 09/20/13 0514  WBC 11.9* 10.8* 13.7* 19.1* 13.6*  HGB 11.7* 11.8* 12.2 11.7* 10.6*  HCT 34.2* 34.8* 36.6 34.0* 31.6*  MCV 93.7 94.1 95.3 91.6 93.8  PLT 205 220 272 301 307   Cardiac Enzymes: No results found for this basename: CKTOTAL, CKMB, CKMBINDEX, TROPONINI,  in the  last 168 hours BNP: BNP (last 3 results)  Recent Labs  09/16/13 2050  PROBNP 337.9   CBG: No results found for this basename: GLUCAP,  in the last 168 hours     Signed:  THOMPSON,DANIEL  [facility] 09/22/2013, 2:04 PM

## 2013-09-23 LAB — CULTURE, BLOOD (ROUTINE X 2): Culture: NO GROWTH

## 2013-10-01 ENCOUNTER — Ambulatory Visit (INDEPENDENT_AMBULATORY_CARE_PROVIDER_SITE_OTHER): Payer: Medicare Other | Admitting: Internal Medicine

## 2013-10-01 ENCOUNTER — Ambulatory Visit (INDEPENDENT_AMBULATORY_CARE_PROVIDER_SITE_OTHER)
Admission: RE | Admit: 2013-10-01 | Discharge: 2013-10-01 | Disposition: A | Discharge status: Home / Self Care | Payer: Medicare Other | Source: Ambulatory Visit | Attending: Internal Medicine | Admitting: Internal Medicine

## 2013-10-01 ENCOUNTER — Encounter: Payer: Self-pay | Admitting: Internal Medicine

## 2013-10-01 VITALS — BP 118/56 | HR 114 | Temp 98.2°F | Ht 60.0 in | Wt 158.0 lb

## 2013-10-01 DIAGNOSIS — J9610 Chronic respiratory failure, unspecified whether with hypoxia or hypercapnia: Secondary | ICD-10-CM

## 2013-10-01 DIAGNOSIS — J449 Chronic obstructive pulmonary disease, unspecified: Secondary | ICD-10-CM

## 2013-10-01 DIAGNOSIS — F17200 Nicotine dependence, unspecified, uncomplicated: Secondary | ICD-10-CM

## 2013-10-01 NOTE — Progress Notes (Signed)
Subjective:    Patient ID: Andrea [name], female    DOB: July 05, 1936   MRN: [id number]    Brief patient profile:  62 yowf smoker referred to pulmonary clinic 7/22/114 by Dr Waynard Edwards for unexplained hypoxemia and hypersomnolence with GOLD III copd by spirometry 10/01/2013    History of Present Illness  06/30/2013 1st pulmonary ov cc L chest pain mid axillary around C3-6  ever since surgery for lung ca limited by fatigue  > sob on advair, spiriva, on saba hfa/ neb less than once a month using 02 2lpm noct only plus prn with no am cough/ congestion puts 02 on in am when ready to take it easy but not actually with exertion which she says is not really limited by sob, though she is quite sedentary able to walk flat and slow "anywhere I want"   rec The key is to stop smoking completely before smoking completely stops you!  if you feel your breathing is limiting you from a desired activity use the 02 at 2lpm and attempt to do it and monitor your sats above 90% Please schedule a follow up office visit in 6 weeks, call sooner if needed with pft's  Late add : revisit the issue of who is following her cpap/noct 02 issues and whether mohammed actively following   Completed RT sept 2014   Admitted Oct 9-14 th with pna    10/01/2013 f/u ov/Karmel Patricelli re: copd / still smoking /off pred since 2 days after discharge  And hold ground Chief Complaint  Patient presents with  . HFU    Pt states that her breathing has improved, but daughter states that it is worse since d/c. She states that she has a cough due to sinus drainage, but can not say if cough is prod or not.   maint rx advair/ spiriva, not using alb in either hfa or neb but doesn't know name of hfa or even what color the container is.  No obvious day to day or daytime variabilty or assoc purulent/bloody sputum or  cp or chest tightness, subjective wheeze overt sinus or hb symptoms. No unusual exp hx or h/o childhood pna/ asthma or knowledge of premature  birth.  Sleeping ok without nocturnal  or early am exacerbation  of respiratory  c/o's or need for noct saba. Also denies any obvious fluctuation of symptoms with weather or environmental changes or other aggravating or alleviating factors except as outlined above   Current Medications, Allergies, Complete Past Medical History, Past Surgical History, Family History, and Social History were reviewed in [employer] record.  ROS  The following are not active complaints unless bolded sore throat, dysphagia, dental problems, itching, sneezing,  nasal congestion or excess/ purulent secretions, ear ache,   fever, chills, sweats, unintended wt loss, pleuritic or exertional cp, hemoptysis,  orthopnea pnd or leg swelling, presyncope, palpitations, heartburn, abdominal pain, anorexia, nausea, vomiting, diarrhea  or change in bowel or urinary habits, change in stools or urine, dysuria,hematuria,  rash, arthralgias, visual complaints, headache, numbness weakness or ataxia or problems with walking or coordination,  change in mood/affect or memory.             Objective:   Physical Exam  amb wf nad at rest moderately hoarse and using rolling walker with brakes  10/01/2013     158  Wt Readings from Last 3 Encounters:  06/30/13 158 lb 12.8 oz (72.031 kg)  06/04/12 142 lb (64.411 kg)  02/12/12 143 lb (64.864  kg)     HEENT mild turbinate edema.  Oropharynx no thrush or excess pnd or cobblestoning.  No JVD or cervical adenopathy. Mild accessory muscle hypertrophy. Trachea midline, nl thryroid. Chest was hyperinflated by percussion with diminished breath sounds and moderate increased exp time without wheeze. Hoover sign positive at mid inspiration. Regular rate and rhythm without murmur gallop or rub or increase P2 or edema.  Abd: no hsm, nl excursion. Ext warm without cyanosis or clubbing.    CXR  10/01/2013 :   1. New confluent 27 mm area of opacity in the left upper lung  seen only on the frontal view. This may be cavitary, such as developing Lung abscess in this setting. Cavitary neoplasm not excluded. Followup chest CT (IV contrast preferred) would evaluate further.  2. Otherwise interval improved bibasilar ventilation and underlying chronic lung disease.        Assessment & Plan:

## 2013-10-01 NOTE — Assessment & Plan Note (Signed)
>   3 m discussion I took an extended  opportunity with this patient to outline the consequences of continued cigarette use  in airway disorders based on all the data we have from the multiple national lung health studies (perfomed over decades at millions of dollars in cost)  indicating that smoking cessation, not choice of inhalers or physicians, is the most important aspect of care.

## 2013-10-01 NOTE — Assessment & Plan Note (Signed)
DDX of  difficult airways managment all start with A and  include Adherence, Ace Inhibitors, Acid Reflux, Active Sinus Disease, Alpha 1 Antitripsin deficiency, Anxiety masquerading as Airways dz,  ABPA,  allergy(esp in young), Aspiration (esp in elderly), Adverse effects of DPI,  Active smokers, plus two Bs  = Bronchiectasis and Beta blocker use..and one C= CHF   Adherence is always the initial "prime suspect" and is a multilayered concern that requires a "trust but verify" approach in every patient - starting with knowing how to use medications, especially inhalers, correctly, keeping up with refills and understanding the fundamental difference between maintenance and prns vs those medications only taken for a very short course and then stopped and not refilled. The proper method of use, as well as anticipated side effects, of a metered-dose dry powder  inhaler are discussed and demonstrated to the patient. Improved effectiveness after extensive coaching during this visit to a level of approximately  90% with coaching   Active smoking really greatest concern > see sep a/p

## 2013-10-01 NOTE — Assessment & Plan Note (Signed)
-   10/01/2013  Walked RA x 3 laps @ 185 ft each stopped due to and of study sats   96%   02 rx as of 10/01/2013 = 2lpm at rest and only prn daytime

## 2013-10-01 NOTE — Progress Notes (Signed)
Quick Note:  Advised pt of cxr results per MW. Pt verbalized understanding and will keep f/u appts ______

## 2013-10-01 NOTE — Patient Instructions (Addendum)
Plan A = automatic advair and spiriva  Plan B = Backup = albuterol 2 puffs every 4 hour if needed - don't leave home without it  Plan C = Backup to plan B = albuterol nebulizer up to every 4 hours if needed  Plan D = Doctor, call us if having to use Plan C more    Plan E = ER   The key is to stop smoking completely before smoking completely stops you!   Use 02 2lpm at bedtime and just as needed during the day  Please remember to go to the   x-ray department downstairs for your tests - we will call you with the results when they are available.    Follow up can be as needed

## 2013-10-01 NOTE — Assessment & Plan Note (Signed)
She has f/u CT chest scheduled for early Dec which should be adequate f/u for the ? Nodule in the LUL

## 2013-10-07 ENCOUNTER — Encounter: Payer: Self-pay | Admitting: Oncology

## 2013-10-08 ENCOUNTER — Encounter: Payer: Self-pay | Admitting: Radiation Oncology

## 2013-10-08 ENCOUNTER — Ambulatory Visit
Admission: RE | Admit: 2013-10-08 | Discharge: 2013-10-08 | Disposition: A | Discharge status: Home / Self Care | Payer: Medicare Other | Source: Ambulatory Visit | Attending: Radiation Oncology | Admitting: Radiation Oncology

## 2013-10-08 NOTE — Progress Notes (Signed)
Edward Qualia here with her sister for follow up after treatment to her chest.  She denies pain and has not had to take pain medication for 2 weeks.  She was recently hospitalized for pneumondia and was discharged on 10/14.  She does have a frequent cough that has improved.  She denies coughing up blood.  She does have shortness of breath and fatigue with activity.  Her oxygen saturation today was 94% on room air.  She is using a walker.  She states her appetite is good and she is eating a lot.

## 2013-10-08 NOTE — Progress Notes (Signed)
Radiation Oncology         [phone] ________________________________  Name: Andrea [name] MRN: [id number]  Date: 10/08/2013  DOB: 1936/04/21  Follow-Up Visit Note  CC: Andrea Kayser, MD  Andrea Gaul, MD  Diagnosis:   Recurrent non-small cell lung cancer  Interval Since Last Radiation:  1  months  Narrative:  The patient returns today for routine follow-up.  Interval history is significant for the patient being admitted to the hospital for pneumonia. She remained as an inpatient for approximately a week. She is feeling much better after IV antibiotics. She continues to have significant fatigue at this time. She does not have any pain along the left chest wall area which she had prior to her radiation therapy.                              ALLERGIES:  has No Known Allergies.  Meds: Current Outpatient Prescriptions  Medication Sig Dispense Refill  . acetaminophen (TYLENOL) 500 MG tablet Take 500 mg by mouth 2 (two) times daily.      Marland Kitchen albuterol (PROVENTIL HFA;VENTOLIN HFA) 108 (90 BASE) MCG/ACT inhaler Inhale 2 puffs into the lungs every 4 (four) hours as needed for shortness of breath.       Marland Kitchen albuterol (PROVENTIL) (2.5 MG/3ML) 0.083% nebulizer solution Take 3 mLs (2.5 mg total) by nebulization every 4 (four) hours as needed for shortness of breath. Use nebs every 8 hours x 3 days then use as needed.  75 mL  0  . alendronate (FOSAMAX) 70 MG tablet Take 70 mg by mouth every 7 (seven) days. Sundays      . aspirin EC 81 MG tablet Take 81 mg by mouth every morning.       . cholecalciferol (VITAMIN D) 1000 UNITS tablet Take 2,000 Units by mouth every morning.       . Fluticasone-Salmeterol (ADVAIR) 250-50 MCG/DOSE AEPB Inhale 1 puff into the lungs every 12 (twelve) hours.        Marland Kitchen galantamine (RAZADYNE ER) 16 MG 24 hr capsule Take 16 mg by mouth every morning.      Marland Kitchen losartan (COZAAR) 50 MG tablet Take 50 mg by mouth every morning.      . lovastatin (MEVACOR) 40 MG tablet Take 40  mg by mouth every morning.       . memantine (NAMENDA) 10 MG tablet Take 10 mg by mouth 2 (two) times daily.      . Multiple Vitamin (MULITIVITAMIN WITH MINERALS) TABS Take 1 tablet by mouth every morning.       . Omega-3 Fatty Acids (FISH OIL) 1200 MG CAPS Take 1 capsule by mouth every morning.       . Probiotic Product (ALIGN) 4 MG CAPS Take 1 tablet by mouth daily.      . roflumilast (DALIRESP) 500 MCG TABS tablet Take 500 mcg by mouth every morning.       . tiotropium (SPIRIVA) 18 MCG inhalation capsule Place 18 mcg into inhaler and inhale daily.       . Emollient (BIAFINE WOUND DRESSING EX) Apply topically.      Marland Kitchen guaiFENesin (MUCINEX) 600 MG 12 hr tablet Take 2 tablets (1,200 mg total) by mouth 2 (two) times daily. Use for 3 days then as needed.  20 tablet  0  . HYDROcodone-acetaminophen (NORCO/VICODIN) 5-325 MG per tablet Take 1 tablet by mouth every 6 (six) hours as needed for pain.  30 tablet  0  . ibuprofen (ADVIL,MOTRIN) 200 MG tablet Take 200 mg by mouth 2 (two) times daily.       No current facility-administered medications for this encounter.    Physical Findings: The patient is in no acute distress. Patient is alert and oriented.  height is 5' (1.524 m) and weight is 158 lb 12.8 oz (72.031 kg). Her temperature is 97.6 F (36.4 C). Her blood pressure is 121/43 and her pulse is 96. Her oxygen saturation is 94%. . No palpable supraclavicular or axillary adenopathy. The lungs are clear to auscultation. The heart has a regular rhythm and rate.  Lab Findings: Lab Results  Component Value Date   WBC 13.6* 09/20/2013   HGB 10.6* 09/20/2013   HCT 31.6* 09/20/2013   MCV 93.8 09/20/2013   PLT 307 09/20/2013      Radiographic Findings: Dg Chest 2 View  10/01/2013   CLINICAL DATA:  77 year old female with chronic obstructed pulmonary disease. Recent multifocal pneumonia. History of lung cancer. Subsequent encounter.  EXAM: CHEST  2 VIEW  COMPARISON:  Chest CT 09/16/2013 and  earlier.  FINDINGS: Increased left upper lobe opacity, maybe cavitary. This encompasses 27 mm in diameter and is not identified on the lateral view.  Otherwise interval mildly improved ventilation of both lung bases. Stable increased interstitial markings and basilar scarring. No pneumothorax, pulmonary edema or pleural effusion. Stable cardiac size and mediastinal contours. No acute osseous abnormality identified.  IMPRESSION: 1. New confluent 27 mm area of opacity in the left upper lung seen only on the frontal view. This may be cavitary, such as developing Lung abscess in this setting. Cavitary neoplasm not excluded. Followup chest CT (IV contrast preferred) would evaluate further.  2. Otherwise interval improved bibasilar ventilation and underlying chronic lung disease.  These results will be called to the ordering clinician or representative by the Radiologist Assistant, and communication documented in the PACS Dashboard.   Electronically Signed   By: Andrea [name] M.D.   On: 10/01/2013 14:38   Dg Chest 2 View (if Patient Has Fever And/or Copd)  09/16/2013   *RADIOLOGY REPORT*  Clinical Data: Shortness of breath, cough and weakness.  CHEST - 2 VIEW  Comparison: Chest radiograph performed 08/05/2013  Findings: The lungs are well-aerated.  Mild vascular congestion is noted.  Mild bibasilar airspace opacities likely reflect atelectasis.  There is no evidence of pleural effusion or pneumothorax.  The heart is normal in size; the mediastinal contour is within normal limits.  No acute osseous abnormalities are seen.  IMPRESSION: Mild vascular congestion noted; mild bibasilar airspace opacities likely reflect atelectasis.   Original Report Authenticated By: Tonia Ghent, M.D.   Ct Head Wo Contrast  09/19/2013   CLINICAL DATA:  Confusion and agitation. History of dementia. History of lung cancer and high blood pressure.  EXAM: CT HEAD WITHOUT CONTRAST  TECHNIQUE: Contiguous axial images were obtained from the base  of the skull through the vertex without intravenous contrast.  COMPARISON:  10/2013 CT.  FINDINGS: No intracranial hemorrhage.  No CT evidence of large acute infarct.  No intracranial mass lesion noted on this unenhanced exam.  Remote left occipital calvarial fracture incompletely healed.  Vascular calcifications.  Opacification left maxillary sinus with air-fluid level. Minimal mucosal thickening right maxillary sinus.  Orbital structures unremarkable.  IMPRESSION: No intracranial hemorrhage.  No CT evidence of large acute infarct.  No intracranial mass lesion noted on this unenhanced exam.  Remote left occipital calvarial fracture incompletely healed.  Opacification  left maxillary sinus with air-fluid level. Minimal mucosal thickening right maxillary sinus.   Electronically Signed   By: Bridgett Larsson M.D.   On: 09/19/2013 15:33   Ct Angio Chest Pe W/cm &/or Wo Cm  09/16/2013   *RADIOLOGY REPORT*  Clinical Data: Worsening shortness of breath.  History of lung cancer.  CT ANGIOGRAPHY CHEST  Technique:  Multidetector CT imaging of the chest using the standard protocol during bolus administration of intravenous contrast. Multiplanar reconstructed images including MIPs were obtained and reviewed to evaluate the vascular anatomy.  Contrast: OMNIPAQUE IOHEXOL 350 MG/ML SOLN  Comparison: Chest radiograph September 16, 2013 and PET CT July 20, 2013  Findings: Main pulmonary artery is not enlarged. Surgical clips at the aortopulmonary window resultant some streak artifact limiting evaluation of the pulmonary artery at this level.  Truncation the left pulmonary artery most consistent with prior pneumonectomy. The evaluation of the subsegmental branches within the lower lobes due to patient motion.  Tree in bud appearance of the left upper lobe, new from prior examination.  Tree in bud appearance of the right greater than left lower lobes, new from prior examination. No pleural effusions.  Again seen is focal chest  wall soft tissue mass at the level of the left sixth and seventh ribs.  No parenchymal mass.  Tracheobronchial tree is patent and midline.  No pneumothorax.  The heart and pericardium are not suspicious.  Moderate calcific atherosclerosis of the aortic arch, no aorta is overall normal in course and caliber.  Coarse calcification in the left breast, unchanged from prior PET CT.  Mild nodular thickening left adrenal gland, similar.  IMPRESSION: Motion degraded examination, lung bases poorly visualized.  No convincing evidence of pulmonary embolism to the level of the segmental branches.  Status post partial left pneumonectomy.  New small airway disease within the left upper lobe, bilateral lower lobes concerning for multifocal pneumonia.  Redemonstration of left chest wall mass, please see CT chest biopsy results August 05, 2013 for further description.   Original Report Authenticated By: Awilda Metro    Impression:  The patient is recovering from the effects of radiation.  Events noted above  Plan:  Routine followup in February 2015.  The patient will followup soon with Dr. Arbutus Ped with chest CT scan planned at that time.  _____________________________________ -----------------------------------  Billie Lade, PhD, MD

## 2013-11-24 ENCOUNTER — Other Ambulatory Visit (HOSPITAL_BASED_OUTPATIENT_CLINIC_OR_DEPARTMENT_OTHER): Payer: Medicare Other

## 2013-11-24 ENCOUNTER — Ambulatory Visit (HOSPITAL_COMMUNITY)
Admission: RE | Admit: 2013-11-24 | Discharge: 2013-11-24 | Disposition: A | Discharge status: Home / Self Care | Payer: Medicare Other | Source: Ambulatory Visit | Attending: Internal Medicine | Admitting: Internal Medicine

## 2013-11-24 ENCOUNTER — Encounter (HOSPITAL_COMMUNITY): Payer: Self-pay

## 2013-11-24 DIAGNOSIS — Z923 Personal history of irradiation: Secondary | ICD-10-CM | POA: Insufficient documentation

## 2013-11-24 DIAGNOSIS — C3490 Malignant neoplasm of unspecified part of unspecified bronchus or lung: Secondary | ICD-10-CM | POA: Insufficient documentation

## 2013-11-24 DIAGNOSIS — Z9889 Other specified postprocedural states: Secondary | ICD-10-CM | POA: Insufficient documentation

## 2013-11-24 DIAGNOSIS — M47814 Spondylosis without myelopathy or radiculopathy, thoracic region: Secondary | ICD-10-CM | POA: Insufficient documentation

## 2013-11-24 LAB — CBC WITH DIFFERENTIAL/PLATELET
Basophils Absolute: 0.1 10*3/uL (ref 0.0–0.1)
EOS%: 2.8 % (ref 0.0–7.0)
Eosinophils Absolute: 0.2 10*3/uL (ref 0.0–0.5)
HCT: 37.8 % (ref 34.8–46.6)
HGB: 12.8 g/dL (ref 11.6–15.9)
LYMPH%: 30.7 % (ref 14.0–49.7)
MCH: 32.5 pg (ref 25.1–34.0)
MCV: 96.1 fL (ref 79.5–101.0)
MONO%: 8.8 % (ref 0.0–14.0)
NEUT#: 3.9 10*3/uL (ref 1.5–6.5)
Platelets: 217 10*3/uL (ref 145–400)
RDW: 14.4 % (ref 11.2–14.5)

## 2013-11-24 LAB — COMPREHENSIVE METABOLIC PANEL (CC13)
AST: 18 U/L (ref 5–34)
Alkaline Phosphatase: 45 U/L (ref 40–150)
BUN: 8.8 mg/dL (ref 7.0–26.0)
Creatinine: 0.7 mg/dL (ref 0.6–1.1)
Total Bilirubin: 0.55 mg/dL (ref 0.20–1.20)

## 2013-11-24 MED ORDER — IOHEXOL 300 MG/ML  SOLN
80.0000 mL | Freq: Once | INTRAMUSCULAR | Status: AC | PRN
  Administered 2013-11-24: 80 mL via INTRAVENOUS

## 2013-11-26 ENCOUNTER — Encounter: Payer: Self-pay | Admitting: Internal Medicine

## 2013-11-26 ENCOUNTER — Ambulatory Visit (HOSPITAL_BASED_OUTPATIENT_CLINIC_OR_DEPARTMENT_OTHER): Payer: Medicare Other | Admitting: Internal Medicine

## 2013-11-26 ENCOUNTER — Telehealth: Payer: Self-pay | Admitting: Internal Medicine

## 2013-11-26 DIAGNOSIS — C3490 Malignant neoplasm of unspecified part of unspecified bronchus or lung: Secondary | ICD-10-CM

## 2013-11-26 DIAGNOSIS — F17200 Nicotine dependence, unspecified, uncomplicated: Secondary | ICD-10-CM

## 2013-11-26 NOTE — Telephone Encounter (Signed)
appts made per 12/18 POF and chart orders AVS and CAL given shh

## 2013-11-26 NOTE — Patient Instructions (Signed)
Followup visit in 3 months with repeat CT scan of the chest. 

## 2013-11-26 NOTE — Progress Notes (Signed)
[facility] [phone]   [phone]  OFFICE PROGRESS NOTE  [name], MD [address] [place] [address]  DIAGNOSIS: Recurrent non-small cell lung cancer initially diagnosed as stage IIB (T3, N0, M0) non-small cell lung cancer consistent with invasive squamous cell carcinoma diagnosed in December of 2012.   PRIOR THERAPY:  1) Status post left upper lobectomy with lymph node dissection on 12/17/2011 under the care of Dr. Edwyna Shell.  2) status post palliative radiotherapy to recurrent disease along the left lateral chest well completed on 09/08/2013 under the care of Dr. Roselind Messier.  CURRENT THERAPY: None.   CHEMOTHERAPY INTENT: N/A  CURRENT # OF CHEMOTHERAPY CYCLES: 0  CURRENT ANTIEMETICS: N/A  CURRENT SMOKING STATUS: Current smoker. Not willing to quit. Given counseling  ORAL CHEMOTHERAPY AND CONSENT: None  CURRENT BISPHOSPHONATES USE: None  PAIN MANAGEMENT: 3/10 left chest wall pain  NARCOTICS INDUCED CONSTIPATION: None  LIVING WILL AND CODE STATUS: Full code initially but not for long term resuscitation.   INTERVAL HISTORY: Andrea [name] 76 y.o. female returns to the clinic today for followup visit accompanied by her sister. The patient is feeling fine today with no specific complaints except for mild fatigue and shortness of breath. She continues to smoke and I strongly encouraged her to quit smoking and offered her smoke cessation program. She tolerated her previous radiotherapy to the enlarging left upper lobe lung nodule fairly well except for fatigue. She had repeat CT scan of the chest performed recently and she is here for evaluation and discussion of her scan results.  MEDICAL HISTORY: Past Medical History  Diagnosis Date  . Hypertension   . PVD (peripheral vascular disease)   . History of shingles 1985  . Sleep related hypoxia     USES CPAP  . Tobacco abuse   . Complication of anesthesia     confusion after surgery  .  COPD (chronic obstructive pulmonary disease)     Emphezma  . Hypoxia, sleep related     Oxygen 2 liters at night, HOB  approx 30-45 degree  . Arthritis     Hands  . DEMENTIA   . Cancer     l lung cancer  . Hearing loss   . Lung cancer 12/06/11    left upper lung  . Acute encephalopathy 09/19/2013  . History of radiation therapy 08/17/2013-09/08/2013    42.5 gray to left lateral chest    ALLERGIES:  has No Known Allergies.  MEDICATIONS:  Current Outpatient Prescriptions  Medication Sig Dispense Refill  . acetaminophen (TYLENOL) 500 MG tablet Take 500 mg by mouth 2 (two) times daily.      Marland Kitchen albuterol (PROVENTIL HFA;VENTOLIN HFA) 108 (90 BASE) MCG/ACT inhaler Inhale 2 puffs into the lungs every 4 (four) hours as needed for shortness of breath.       Marland Kitchen albuterol (PROVENTIL) (2.5 MG/3ML) 0.083% nebulizer solution Take 3 mLs (2.5 mg total) by nebulization every 4 (four) hours as needed for shortness of breath. Use nebs every 8 hours x 3 days then use as needed.  75 mL  0  . alendronate (FOSAMAX) 70 MG tablet Take 70 mg by mouth every 7 (seven) days. Sundays      . aspirin EC 81 MG tablet Take 81 mg by mouth every morning.       . cholecalciferol (VITAMIN D) 1000 UNITS tablet Take 2,000 Units by mouth every morning.       . Emollient (BIAFINE WOUND  DRESSING EX) Apply topically.      . Fluticasone-Salmeterol (ADVAIR) 250-50 MCG/DOSE AEPB Inhale 1 puff into the lungs every 12 (twelve) hours.        Marland Kitchen galantamine (RAZADYNE ER) 16 MG 24 hr capsule Take 16 mg by mouth every morning.      Marland Kitchen guaiFENesin (MUCINEX) 600 MG 12 hr tablet Take 2 tablets (1,200 mg total) by mouth 2 (two) times daily. Use for 3 days then as needed.  20 tablet  0  . HYDROcodone-acetaminophen (NORCO/VICODIN) 5-325 MG per tablet Take 1 tablet by mouth every 6 (six) hours as needed for pain.  30 tablet  0  . ibuprofen (ADVIL,MOTRIN) 200 MG tablet Take 200 mg by mouth 2 (two) times daily.      Marland Kitchen losartan (COZAAR) 50 MG tablet  Take 50 mg by mouth every morning.      . lovastatin (MEVACOR) 40 MG tablet Take 40 mg by mouth every morning.       . memantine (NAMENDA) 10 MG tablet Take 10 mg by mouth 2 (two) times daily.      . Multiple Vitamin (MULITIVITAMIN WITH MINERALS) TABS Take 1 tablet by mouth every morning.       . Omega-3 Fatty Acids (FISH OIL) 1200 MG CAPS Take 1 capsule by mouth every morning.       . Probiotic Product (ALIGN) 4 MG CAPS Take 1 tablet by mouth daily.      . roflumilast (DALIRESP) 500 MCG TABS tablet Take 500 mcg by mouth every morning.       . tiotropium (SPIRIVA) 18 MCG inhalation capsule Place 18 mcg into inhaler and inhale daily.        No current facility-administered medications for this visit.    SURGICAL HISTORY:  Past Surgical History  Procedure Laterality Date  . Appendectomy      1957  . Elbow surgery  2008    right elbow  . Tonsillectomy    . Lung biospy    . Lung removal, partial Left 12/16/12    left upper resection    REVIEW OF SYSTEMS:  A comprehensive review of systems was negative except for: Constitutional: positive for fatigue Respiratory: positive for dyspnea on exertion   PHYSICAL EXAMINATION: General appearance: alert, cooperative, fatigued and no distress Head: Normocephalic, without obvious abnormality, atraumatic Neck: no adenopathy Lymph nodes: Cervical, supraclavicular, and axillary nodes normal. Resp: clear to auscultation bilaterally Cardio: regular rate and rhythm, S1, S2 normal, no murmur, click, rub or gallop GI: soft, non-tender; bowel sounds normal; no masses,  no organomegaly Extremities: extremities normal, atraumatic, no cyanosis or edema  ECOG PERFORMANCE STATUS: 1 - Symptomatic but completely ambulatory  Blood pressure 107/66, pulse 90, temperature 97 F (36.1 C), temperature source Oral, resp. rate 18, height 5' (1.524 m), weight 155 lb 1.6 oz (70.353 kg).  LABORATORY DATA: Lab Results  Component Value Date   WBC 6.8 11/24/2013   HGB  12.8 11/24/2013   HCT 37.8 11/24/2013   MCV 96.1 11/24/2013   PLT 217 11/24/2013      Chemistry      Component Value Date/Time   NA 139 11/24/2013 0954   NA 134* 09/22/2013 0600   K 3.7 11/24/2013 0954   K 3.6 09/22/2013 0600   CL 96 09/22/2013 0600   CO2 26 11/24/2013 0954   CO2 31 09/22/2013 0600   BUN 8.8 11/24/2013 0954   BUN 12 09/22/2013 0600   CREATININE 0.7 11/24/2013 0954   CREATININE 0.53  09/22/2013 0600   CREATININE 0.76 11/28/2011 1220      Component Value Date/Time   CALCIUM 9.8 11/24/2013 0954   CALCIUM 9.7 09/22/2013 0600   ALKPHOS 45 11/24/2013 0954   ALKPHOS 58 01/07/2012 1349   AST 18 11/24/2013 0954   AST 14 01/07/2012 1349   ALT 12 11/24/2013 0954   ALT 9 01/07/2012 1349   BILITOT 0.55 11/24/2013 0954   BILITOT 0.5 01/07/2012 1349       RADIOGRAPHIC STUDIES: Ct Chest W Contrast  11/24/2013   CLINICAL DATA:  Lung cancer.  Status post surgery and XRT.  EXAM: CT CHEST WITH CONTRAST  TECHNIQUE: Multidetector CT imaging of the chest was performed during intravenous contrast administration.  CONTRAST:  80mL OMNIPAQUE IOHEXOL 300 MG/ML  SOLN  COMPARISON:  09/16/2013  FINDINGS: No pleural effusion identified. Postsurgical change and volume loss is noted involving the left lung. Radiation change within the left mid lung is again identified. Mass like consolidation within the anterior mid left lung is new from previous exam. This measures 2.2 x 1.6 x 1.7 cm, image 32/series 5. Similar appearance of pleural thickening overlying the left lung. No suspicious pulmonary nodule or mass identified within the right lung.  The heart size is normal. No pericardial effusion. Prominent calcifications involving the LAD, and left circumflex coronary arteries noted. No pericardial effusion. No mediastinal or hilar adenopathy identified. There is no axillary or supraclavicular adenopathy.  Incidental imaging through the upper abdomen shows no acute findings.  Review of the visualized  osseous structures is significant for mild thoracic spondylosis. No aggressive lytic or sclerotic bone lesions identified.  IMPRESSION: 1. Interval development of mass like consolidation within the anterior left lung. The appearance is nonspecific and may reflect changes from external beam radiation. Residual or recurrent tumor would be difficult to exclude and close interval follow-up is advised. If there is a clinical concern for recurrence of tumor in the left lung then further assessment with PET-CT may be helpful. 2. No evidence for distant metastatic disease.   Electronically Signed   By: Signa Kell M.D.   On: 11/24/2013 12:46   ASSESSMENT AND PLAN: This is a very pleasant 77 years old white female with questionable recurrent non-small cell lung cancer presented with hypermetabolic nodular thickening associated with rib destruction along the left chest wall. She status post palliative radiotherapy to that area under the care of Dr. Roselind Messier completed on 09/08/2013. Her recent scan showed no significant evidence for disease progression but there was interval development of masslike consolidation within the anterior left lung. I would see her back for followup visit in 3 months with repeat CT scan of the chest for restaging of her disease. I strongly advise her again to quit smoking and offered her smoke cessation program. The patient was advised to call immediately if she has any concerning symptoms in the interval. The patient voices understanding of current disease status and treatment options and is in agreement with the current care plan.  All questions were answered. The patient knows to call the clinic with any problems, questions or concerns. We can certainly see the patient much sooner if necessary.

## 2014-01-14 ENCOUNTER — Encounter: Payer: Self-pay | Admitting: Radiation Oncology

## 2014-01-14 ENCOUNTER — Ambulatory Visit
Admission: RE | Admit: 2014-01-14 | Discharge: 2014-01-14 | Disposition: A | Discharge status: Home / Self Care | Payer: Medicare Other | Source: Ambulatory Visit | Attending: Radiation Oncology | Admitting: Radiation Oncology

## 2014-01-14 VITALS — BP 118/69 | HR 97 | Temp 98.4°F | Ht 60.0 in | Wt 157.2 lb

## 2014-01-14 DIAGNOSIS — C3490 Malignant neoplasm of unspecified part of unspecified bronchus or lung: Secondary | ICD-10-CM

## 2014-01-14 NOTE — Progress Notes (Addendum)
Ms. Andrea [name] here today for assessment following radiation therapy to her left lateral chest wall which completed in 08/2013.  She denies any pain today, but note when she is ambulating that she experiences SOB.  After ambulating today her )2 sat was 93%.  She uses home O2 at night.

## 2014-01-14 NOTE — Progress Notes (Signed)
Radiation Oncology         [phone] ________________________________  Name: Andrea [name] MRN: [id number]  Date: 01/14/2014  DOB: [birth year]  Follow-Up Visit Note  CC: Jerlyn Ly, MD  Curt Bears, MD  Diagnosis:   Lung cancer    Primary site: Lung (Left)   Staging method: AJCC 7th Edition   Clinical: Stage IIB (T3, N0, M0) signed by Curt Bears, MD on 07/15/2013  4:19 PM   Summary: Stage IIB (T3, N0, M0)   Interval Since Last Radiation:  4  months  Narrative:  The patient returns today for routine follow-up.  She seems stable at this time. She continues to use supplemental oxygen at night. She denies any pain along the left chest area or significant cough or hemoptysis.                              ALLERGIES:  has No Known Allergies.  Meds: Current Outpatient Prescriptions  Medication Sig Dispense Refill  . acetaminophen (TYLENOL) 500 MG tablet Take 500 mg by mouth 2 (two) times daily.      Marland Kitchen albuterol (PROVENTIL HFA;VENTOLIN HFA) 108 (90 BASE) MCG/ACT inhaler Inhale 2 puffs into the lungs every 4 (four) hours as needed for shortness of breath.       Marland Kitchen albuterol (PROVENTIL) (2.5 MG/3ML) 0.083% nebulizer solution Take 3 mLs (2.5 mg total) by nebulization every 4 (four) hours as needed for shortness of breath. Use nebs every 8 hours x 3 days then use as needed.  75 mL  0  . alendronate (FOSAMAX) 70 MG tablet Take 70 mg by mouth every 7 (seven) days. Sundays      . aspirin EC 81 MG tablet Take 81 mg by mouth every morning.       . cholecalciferol (VITAMIN D) 1000 UNITS tablet Take 2,000 Units by mouth every morning.       . cyclobenzaprine (FLEXERIL) 10 MG tablet Take 10 mg by mouth as needed for muscle spasms.      . Fluticasone-Salmeterol (ADVAIR) 250-50 MCG/DOSE AEPB Inhale 1 puff into the lungs every 12 (twelve) hours.        Marland Kitchen galantamine (RAZADYNE ER) 16 MG 24 hr capsule Take 16 mg by mouth every morning.      Marland Kitchen guaiFENesin (MUCINEX) 600 MG 12 hr tablet Take 2  tablets (1,200 mg total) by mouth 2 (two) times daily. Use for 3 days then as needed.  20 tablet  0  . ibuprofen (ADVIL,MOTRIN) 200 MG tablet Take 200 mg by mouth 2 (two) times daily.      Marland Kitchen losartan (COZAAR) 50 MG tablet Take 50 mg by mouth every morning.      . lovastatin (MEVACOR) 40 MG tablet Take 40 mg by mouth every morning.       . memantine (NAMENDA) 10 MG tablet Take 10 mg by mouth 2 (two) times daily.      . Omega-3 Fatty Acids (FISH OIL) 1200 MG CAPS Take 1 capsule by mouth every morning.       . Probiotic Product (ALIGN) 4 MG CAPS Take 1 tablet by mouth daily.      . roflumilast (DALIRESP) 500 MCG TABS tablet Take 500 mcg by mouth every morning.       . tiotropium (SPIRIVA) 18 MCG inhalation capsule Place 18 mcg into inhaler and inhale daily.       . Multiple Vitamin (MULITIVITAMIN WITH MINERALS) TABS  Take 1 tablet by mouth every morning.        No current facility-administered medications for this encounter.    Physical Findings: The patient is in no acute distress. Patient is alert and oriented.  height is 5' (1.524 m) and weight is 157 lb 3.2 oz (71.305 kg). Her temperature is 98.4 F (36.9 C). Her blood pressure is 118/69 and her pulse is 97. Marland Kitchen  No palpable supraclavicular or axillary adenopathy. The lungs are clear to auscultation. The heart has a regular rhythm and rate. Palpation along the left chest wall area reveals no point tenderness.  Lab Findings: Lab Results  Component Value Date   WBC 6.8 11/24/2013   HGB 12.8 11/24/2013   HCT 37.8 11/24/2013   MCV 96.1 11/24/2013   PLT 217 11/24/2013      Radiographic Findings: No results found.  Impression:  Clinically stable. Patient will undergo repeat chest CT scan next month and followup with Dr. Julien Nordmann soon afterward.  Plan:  When necessary followup in radiation oncology.  ____________________________________ Blair Promise, MD

## 2014-02-17 ENCOUNTER — Ambulatory Visit (HOSPITAL_COMMUNITY)
Admission: RE | Admit: 2014-02-17 | Discharge: 2014-02-17 | Disposition: A | Discharge status: Home / Self Care | Payer: Medicare Other | Source: Ambulatory Visit | Attending: Internal Medicine | Admitting: Internal Medicine

## 2014-02-17 ENCOUNTER — Other Ambulatory Visit (HOSPITAL_BASED_OUTPATIENT_CLINIC_OR_DEPARTMENT_OTHER): Payer: Medicare Other

## 2014-02-17 ENCOUNTER — Encounter (HOSPITAL_COMMUNITY): Payer: Self-pay

## 2014-02-17 DIAGNOSIS — I2584 Coronary atherosclerosis due to calcified coronary lesion: Secondary | ICD-10-CM | POA: Insufficient documentation

## 2014-02-17 DIAGNOSIS — C3490 Malignant neoplasm of unspecified part of unspecified bronchus or lung: Secondary | ICD-10-CM | POA: Insufficient documentation

## 2014-02-17 DIAGNOSIS — Z902 Acquired absence of lung [part of]: Secondary | ICD-10-CM | POA: Insufficient documentation

## 2014-02-17 LAB — CBC WITH DIFFERENTIAL/PLATELET
BASO%: 0.8 % (ref 0.0–2.0)
BASOS ABS: 0 10*3/uL (ref 0.0–0.1)
EOS%: 2.6 % (ref 0.0–7.0)
Eosinophils Absolute: 0.2 10*3/uL (ref 0.0–0.5)
HCT: 40.5 % (ref 34.8–46.6)
HEMOGLOBIN: 13.5 g/dL (ref 11.6–15.9)
LYMPH%: 32.8 % (ref 14.0–49.7)
MCH: 31.3 pg (ref 25.1–34.0)
MCHC: 33.2 g/dL (ref 31.5–36.0)
MCV: 94.1 fL (ref 79.5–101.0)
MONO#: 0.5 10*3/uL (ref 0.1–0.9)
MONO%: 7.9 % (ref 0.0–14.0)
NEUT#: 3.6 10*3/uL (ref 1.5–6.5)
NEUT%: 55.9 % (ref 38.4–76.8)
Platelets: 213 10*3/uL (ref 145–400)
RBC: 4.3 10*6/uL (ref 3.70–5.45)
RDW: 14.8 % — AB (ref 11.2–14.5)
WBC: 6.5 10*3/uL (ref 3.9–10.3)
lymph#: 2.1 10*3/uL (ref 0.9–3.3)

## 2014-02-17 LAB — COMPREHENSIVE METABOLIC PANEL (CC13)
ALBUMIN: 3.8 g/dL (ref 3.5–5.0)
ALT: 16 U/L (ref 0–55)
AST: 21 U/L (ref 5–34)
Alkaline Phosphatase: 48 U/L (ref 40–150)
Anion Gap: 9 mEq/L (ref 3–11)
BUN: 13.4 mg/dL (ref 7.0–26.0)
CO2: 26 meq/L (ref 22–29)
Calcium: 10 mg/dL (ref 8.4–10.4)
Chloride: 105 mEq/L (ref 98–109)
Creatinine: 0.8 mg/dL (ref 0.6–1.1)
Glucose: 92 mg/dl (ref 70–140)
POTASSIUM: 4.4 meq/L (ref 3.5–5.1)
Sodium: 141 mEq/L (ref 136–145)
Total Bilirubin: 0.73 mg/dL (ref 0.20–1.20)
Total Protein: 7.4 g/dL (ref 6.4–8.3)

## 2014-02-17 MED ORDER — IOHEXOL 300 MG/ML  SOLN
80.0000 mL | Freq: Once | INTRAMUSCULAR | Status: AC | PRN
  Administered 2014-02-17: 80 mL via INTRAVENOUS

## 2014-02-24 ENCOUNTER — Ambulatory Visit (HOSPITAL_BASED_OUTPATIENT_CLINIC_OR_DEPARTMENT_OTHER): Payer: Medicare Other | Admitting: Internal Medicine

## 2014-02-24 ENCOUNTER — Encounter: Payer: Self-pay | Admitting: Internal Medicine

## 2014-02-24 ENCOUNTER — Telehealth: Payer: Self-pay | Admitting: Internal Medicine

## 2014-02-24 VITALS — BP 120/58 | HR 77 | Temp 97.5°F | Resp 17 | Ht 60.0 in | Wt 153.0 lb

## 2014-02-24 DIAGNOSIS — C3490 Malignant neoplasm of unspecified part of unspecified bronchus or lung: Secondary | ICD-10-CM

## 2014-02-24 DIAGNOSIS — F17200 Nicotine dependence, unspecified, uncomplicated: Secondary | ICD-10-CM

## 2014-02-24 NOTE — Patient Instructions (Signed)
Smoking Cessation, Tips for Success If you are ready to quit smoking, congratulations! You have chosen to help yourself be healthier. Cigarettes bring nicotine, tar, carbon monoxide, and other irritants into your body. Your lungs, heart, and blood vessels will be able to work better without these poisons. There are many different ways to quit smoking. Nicotine gum, nicotine patches, a nicotine inhaler, or nicotine nasal spray can help with physical craving. Hypnosis, support groups, and medicines help break the habit of smoking. WHAT THINGS CAN I DO TO MAKE QUITTING EASIER?  Here are some tips to help you quit for good:  Pick a date when you will quit smoking completely. Tell all of your friends and family about your plan to quit on that date.  Do not try to slowly cut down on the number of cigarettes you are smoking. Pick a quit date and quit smoking completely starting on that day.  Throw away all cigarettes.   Clean and remove all ashtrays from your home, work, and car.   On a card, write down your reasons for quitting. Carry the card with you and read it when you get the urge to smoke.   Cleanse your body of nicotine. Drink enough water and fluids to keep your urine clear or pale yellow. Do this after quitting to flush the nicotine from your body.   Learn to predict your moods. Do not let a bad situation be your excuse to have a cigarette. Some situations in your life might tempt you into wanting a cigarette.   Never have "just one" cigarette. It leads to wanting another and another. Remind yourself of your decision to quit.   Change habits associated with smoking. If you smoked while driving or when feeling stressed, try other activities to replace smoking. Stand up when drinking your coffee. Brush your teeth after eating. Sit in a different chair when you read the paper. Avoid alcohol while trying to quit, and try to drink fewer caffeinated beverages. Alcohol and caffeine may urge  you to smoke.   Avoid foods and drinks that can trigger a desire to smoke, such as sugary or spicy foods and alcohol.   Ask people who smoke not to smoke around you.   Have something planned to do right after eating or having a cup of coffee. For example, plan to take a walk or exercise.   Try a relaxation exercise to calm you down and decrease your stress. Remember, you may be tense and nervous for the first 2 weeks after you quit, but this will pass.   Find new activities to keep your hands busy. Play with a pen, coin, or rubber band. Doodle or draw things on paper.   Brush your teeth right after eating. This will help cut down on the craving for the taste of tobacco after meals. You can also try mouthwash.   Use oral substitutes in place of cigarettes. Try using lemon drops, carrots, cinnamon sticks, or chewing gum. Keep them handy so they are available when you have the urge to smoke.   When you have the urge to smoke, try deep breathing.   Designate your home as a nonsmoking area.   If you are a heavy smoker, ask your health care provider about a prescription for nicotine chewing gum. It can ease your withdrawal from nicotine.   Reward yourself. Set aside the cigarette money you save and buy yourself something nice.   Look for support from others. Join a support group or   smoking cessation program. Ask someone at home or at work to help you with your plan to quit smoking.   Always ask yourself, "Do I need this cigarette or is this just a reflex?" Tell yourself, "Today, I choose not to smoke," or "I do not want to smoke." You are reminding yourself of your decision to quit.  Do not replace cigarette smoking with electronic cigarettes (commonly called e-cigarettes). The safety of e-cigarettes is unknown, and some may contain harmful chemicals.  If you relapse, do not give up! Plan ahead and think about what you will do the next time you get the urge to smoke.  HOW WILL  I FEEL WHEN I QUIT SMOKING? You may have symptoms of withdrawal because your body is used to nicotine (the addictive substance in cigarettes). You may crave cigarettes, be irritable, feel very hungry, cough often, get headaches, or have difficulty concentrating. The withdrawal symptoms are only temporary. They are strongest when you first quit but will go away within 10 14 days. When withdrawal symptoms occur, stay in control. Think about your reasons for quitting. Remind yourself that these are signs that your body is healing and getting used to being without cigarettes. Remember that withdrawal symptoms are easier to treat than the major diseases that smoking can cause.  Even after the withdrawal is over, expect periodic urges to smoke. However, these cravings are generally short lived and will go away whether you smoke or not. Do not smoke!  WHAT RESOURCES ARE AVAILABLE TO HELP ME QUIT SMOKING? Your health care provider can direct you to community resources or hospitals for support, which may include:  Group support.  Education.  Hypnosis.  Therapy. Document Released: 08/24/2004 Document Revised: 09/16/2013 Document Reviewed: 05/14/2013 [employer] Patient Information 2014 [employer].  

## 2014-02-24 NOTE — Telephone Encounter (Signed)
gv and printed appt sched and avs for pt for April  °

## 2014-02-24 NOTE — Progress Notes (Signed)
[facility] Telephone:([phone]) [phone]   [phone]) [phone]  OFFICE PROGRESS NOTE  Jerlyn Ly, MD [place] [id number]  DIAGNOSIS: Recurrent non-small cell lung cancer initially diagnosed as stage IIB (T3, N0, M0) non-small cell lung cancer consistent with invasive squamous cell carcinoma diagnosed in December of 2012.   PRIOR THERAPY:  1) Status post left upper lobectomy with lymph node dissection on 12/17/2011 under the care of Dr. Arlyce Dice.  2) status post palliative radiotherapy to recurrent disease along the left lateral chest well completed on 09/08/2013 under the care of Dr. Sondra Come.  CURRENT THERAPY: None.   CHEMOTHERAPY INTENT: N/A  CURRENT # OF CHEMOTHERAPY CYCLES: 0  CURRENT ANTIEMETICS: N/A  CURRENT SMOKING STATUS: Current smoker. Not willing to quit. Given counseling  ORAL CHEMOTHERAPY AND CONSENT: None  CURRENT BISPHOSPHONATES USE: None  PAIN MANAGEMENT: 3/10 left chest wall pain  NARCOTICS INDUCED CONSTIPATION: None  LIVING WILL AND CODE STATUS: Full code initially but not for long term resuscitation.   INTERVAL HISTORY: Andrea [name] 78 y.o. female returns to the clinic today for followup visit accompanied by her sister. The patient is feeling fine today with no specific complaints except for mild fatigue and shortness of breath as well as intermittent left-sided chest pain. She denied having any significant weight loss or night sweats. She has no nausea or vomiting. She continues to smoke and I strongly encouraged her to quit smoking and offered her smoke cessation program but she still unwilling to quit. She had repeat CT scan of the chest performed recently and she is here for evaluation and discussion of her scan results.   MEDICAL HISTORY: Past Medical History  Diagnosis Date  . Hypertension   . PVD (peripheral vascular disease)   . History of shingles 1985  . Sleep related hypoxia     USES CPAP  . Tobacco abuse   .  Complication of anesthesia     confusion after surgery  . COPD (chronic obstructive pulmonary disease)     Emphezma  . Hypoxia, sleep related     Oxygen 2 liters at night, HOB  approx 30-45 degree  . Arthritis     Hands  . DEMENTIA   . Cancer     l lung cancer  . Hearing loss   . Lung cancer 12/06/11    left upper lung  . Acute encephalopathy 09/19/2013  . History of radiation therapy 08/17/2013-09/08/2013    42.5 gray to left lateral chest    ALLERGIES:  has No Known Allergies.  MEDICATIONS:  Current Outpatient Prescriptions  Medication Sig Dispense Refill  . acetaminophen (TYLENOL) 500 MG tablet Take 500 mg by mouth 2 (two) times daily.      Marland Kitchen albuterol (PROVENTIL HFA;VENTOLIN HFA) 108 (90 BASE) MCG/ACT inhaler Inhale 2 puffs into the lungs every 4 (four) hours as needed for shortness of breath.       Marland Kitchen albuterol (PROVENTIL) (2.5 MG/3ML) 0.083% nebulizer solution Take 3 mLs (2.5 mg total) by nebulization every 4 (four) hours as needed for shortness of breath. Use nebs every 8 hours x 3 days then use as needed.  75 mL  0  . alendronate (FOSAMAX) 70 MG tablet Take 70 mg by mouth every 7 (seven) days. Sundays      . aspirin EC 81 MG tablet Take 81 mg by mouth every morning.       . cholecalciferol (VITAMIN D) 1000 UNITS tablet Take 2,000 Units by mouth  every morning.       . cyclobenzaprine (FLEXERIL) 10 MG tablet Take 10 mg by mouth as needed for muscle spasms.      . Fluticasone-Salmeterol (ADVAIR) 250-50 MCG/DOSE AEPB Inhale 1 puff into the lungs every 12 (twelve) hours.        Marland Kitchen galantamine (RAZADYNE ER) 16 MG 24 hr capsule Take 16 mg by mouth every morning.      Marland Kitchen guaiFENesin (MUCINEX) 600 MG 12 hr tablet Take 2 tablets (1,200 mg total) by mouth 2 (two) times daily. Use for 3 days then as needed.  20 tablet  0  . ibuprofen (ADVIL,MOTRIN) 200 MG tablet Take 200 mg by mouth 2 (two) times daily.      Marland Kitchen losartan (COZAAR) 50 MG tablet Take 50 mg by mouth every morning.      .  lovastatin (MEVACOR) 40 MG tablet Take 40 mg by mouth every morning.       . memantine (NAMENDA) 10 MG tablet Take 10 mg by mouth 2 (two) times daily.      . Multiple Vitamin (MULITIVITAMIN WITH MINERALS) TABS Take 1 tablet by mouth every morning.       . Omega-3 Fatty Acids (FISH OIL) 1200 MG CAPS Take 1 capsule by mouth every morning.       . Probiotic Product (ALIGN) 4 MG CAPS Take 1 tablet by mouth daily.      . roflumilast (DALIRESP) 500 MCG TABS tablet Take 500 mcg by mouth every morning.       . tiotropium (SPIRIVA) 18 MCG inhalation capsule Place 18 mcg into inhaler and inhale daily.       Marland Kitchen nystatin (MYCOSTATIN) 100000 UNIT/ML suspension        No current facility-administered medications for this visit.    SURGICAL HISTORY:  Past Surgical History  Procedure Laterality Date  . Appendectomy      1957  . Elbow surgery  2008    right elbow  . Tonsillectomy    . Lung biospy    . Lung removal, partial Left 12/16/12    left upper resection    REVIEW OF SYSTEMS:  A comprehensive review of systems was negative except for: Constitutional: positive for fatigue Respiratory: positive for dyspnea on exertion   PHYSICAL EXAMINATION: General appearance: alert, cooperative, fatigued and no distress Head: Normocephalic, without obvious abnormality, atraumatic Neck: no adenopathy Lymph nodes: Cervical, supraclavicular, and axillary nodes normal. Resp: clear to auscultation bilaterally Cardio: regular rate and rhythm, S1, S2 normal, no murmur, click, rub or gallop GI: soft, non-tender; bowel sounds normal; no masses,  no organomegaly Extremities: extremities normal, atraumatic, no cyanosis or edema  ECOG PERFORMANCE STATUS: 1 - Symptomatic but completely ambulatory  Blood pressure 120/58, pulse 77, temperature 97.5 F (36.4 C), temperature source Oral, resp. rate 17, height 5' (1.524 m), weight 153 lb (69.4 kg), SpO2 97.00%.  LABORATORY DATA: Lab Results  Component Value Date   WBC  6.5 02/17/2014   HGB 13.5 02/17/2014   HCT 40.5 02/17/2014   MCV 94.1 02/17/2014   PLT 213 02/17/2014      Chemistry      Component Value Date/Time   NA 141 02/17/2014 1019   NA 134* 09/22/2013 0600   K 4.4 02/17/2014 1019   K 3.6 09/22/2013 0600   CL 96 09/22/2013 0600   CO2 26 02/17/2014 1019   CO2 31 09/22/2013 0600   BUN 13.4 02/17/2014 1019   BUN 12 09/22/2013 0600   CREATININE 0.8  02/17/2014 1019   CREATININE 0.53 09/22/2013 0600   CREATININE 0.76 11/28/2011 1220      Component Value Date/Time   CALCIUM 10.0 02/17/2014 1019   CALCIUM 9.7 09/22/2013 0600   ALKPHOS 48 02/17/2014 1019   ALKPHOS 58 01/07/2012 1349   AST 21 02/17/2014 1019   AST 14 01/07/2012 1349   ALT 16 02/17/2014 1019   ALT 9 01/07/2012 1349   BILITOT 0.73 02/17/2014 1019   BILITOT 0.5 01/07/2012 1349       RADIOGRAPHIC STUDIES: Ct Chest W Contrast  02/17/2014   CLINICAL DATA Lung cancer.  EXAM CT CHEST WITH CONTRAST  TECHNIQUE Multidetector CT imaging of the chest was performed during intravenous contrast administration.  CONTRAST 36mL OMNIPAQUE IOHEXOL 300 MG/ML  SOLN  COMPARISON CT CHEST W/CM dated 11/24/2013; CT ANGIO CHEST W/CM &/OR WO/CM dated 09/16/2013  FINDINGS No pathologically enlarged mediastinal, hilar or axillary lymph nodes. Atherosclerotic calcification of the arterial vasculature, including three-vessel involvement of the coronary arteries. Heart size normal. No pericardial effusion.  Centrilobular emphysema. Clustered peribronchovascular nodularity in the subpleural right middle lobe (image 36) is new and likely infectious or post infectious in etiology. Postoperative changes of left upper lobectomy with stable subpleural rounded soft tissue in the anterolateral left lower lobe, measuring approximately 1.6 x 2.4 cm. Surrounding scarring and volume loss. No pleural fluid. Airway is otherwise unremarkable.  Incidental imaging of the upper abdomen shows the visualized portions of the liver, adrenal glands,  kidneys, spleen, pancreas, stomach and bowel to be grossly unremarkable. No upper abdominal adenopathy.  IMPRESSION 1. Postoperative changes of left upper lobectomy with stable rounded subpleural soft tissue in the left lower lobe. Difficult to exclude disease recurrence. PET may be helpful in further evaluation, as clinically indicated. 2. Three-vessel coronary artery calcification.  SIGNATURE  Electronically Signed   By: Lorin Picket M.D.   On: 02/17/2014 14:32    ASSESSMENT AND PLAN: This is a very pleasant 78 years old white female with questionable recurrent non-small cell lung cancer presented with hypermetabolic nodular thickening associated with rib destruction along the left chest wall. She status post palliative radiotherapy to that area under the care of Dr. Sondra Come completed on 09/08/2013. Her recent scan showed no significant evidence for disease progression but there was  masslike consolidation within the anterior left lung. I discussed the scan results with the patient and her sister today. I gave her the option of continuous observation and repeat scan in 3 months versus proceeding with a PET scan for further evaluation of the left lower lobe subpleural soft tissue mass. The patient would like to proceed with the PET scan. This would be performed in the next few weeks and the patient would come back for followup visit in 3 weeks for evaluation and discussion of her PET scan results. I strongly advise her again to quit smoking and offered her smoke cessation program. The patient was advised to call immediately if she has any concerning symptoms in the interval. The patient voices understanding of current disease status and treatment options and is in agreement with the current care plan.  All questions were answered. The patient knows to call the clinic with any problems, questions or concerns. We can certainly see the patient much sooner if necessary.  Disclaimer: This note was  dictated with voice recognition software. Similar sounding words can inadvertently be transcribed and may not be corrected upon review.

## 2014-03-04 ENCOUNTER — Other Ambulatory Visit: Payer: Self-pay | Admitting: Internal Medicine

## 2014-03-04 ENCOUNTER — Encounter (HOSPITAL_COMMUNITY): Payer: Self-pay

## 2014-03-04 ENCOUNTER — Ambulatory Visit (HOSPITAL_COMMUNITY)
Admission: RE | Admit: 2014-03-04 | Discharge: 2014-03-04 | Disposition: A | Discharge status: Home / Self Care | Payer: Medicare Other | Source: Ambulatory Visit | Attending: Internal Medicine | Admitting: Internal Medicine

## 2014-03-04 DIAGNOSIS — R091 Pleurisy: Secondary | ICD-10-CM | POA: Insufficient documentation

## 2014-03-04 DIAGNOSIS — C3490 Malignant neoplasm of unspecified part of unspecified bronchus or lung: Secondary | ICD-10-CM

## 2014-03-04 LAB — GLUCOSE, CAPILLARY: Glucose-Capillary: 87 mg/dL (ref 70–99)

## 2014-03-04 MED ORDER — FLUDEOXYGLUCOSE F - 18 (FDG) INJECTION
9.4000 | Freq: Once | INTRAVENOUS | Status: AC | PRN
  Administered 2014-03-04: 9.4 via INTRAVENOUS

## 2014-03-24 ENCOUNTER — Telehealth: Payer: Self-pay | Admitting: Internal Medicine

## 2014-03-24 ENCOUNTER — Ambulatory Visit: Payer: Medicare Other | Admitting: Internal Medicine

## 2014-03-24 NOTE — Telephone Encounter (Signed)
s.w. pt and she needed to r/s.....done...pt awareof new d.t

## 2014-04-06 ENCOUNTER — Encounter: Payer: Self-pay | Admitting: Internal Medicine

## 2014-04-06 ENCOUNTER — Ambulatory Visit (HOSPITAL_BASED_OUTPATIENT_CLINIC_OR_DEPARTMENT_OTHER): Payer: Medicare Other | Admitting: Internal Medicine

## 2014-04-06 ENCOUNTER — Telehealth: Payer: Self-pay | Admitting: Internal Medicine

## 2014-04-06 VITALS — BP 125/63 | HR 91 | Temp 97.9°F | Resp 20 | Ht 60.0 in | Wt 160.4 lb

## 2014-04-06 DIAGNOSIS — C3490 Malignant neoplasm of unspecified part of unspecified bronchus or lung: Secondary | ICD-10-CM

## 2014-04-06 DIAGNOSIS — C50919 Malignant neoplasm of unspecified site of unspecified female breast: Secondary | ICD-10-CM

## 2014-04-06 DIAGNOSIS — C779 Secondary and unspecified malignant neoplasm of lymph node, unspecified: Secondary | ICD-10-CM

## 2014-04-06 DIAGNOSIS — C3410 Malignant neoplasm of upper lobe, unspecified bronchus or lung: Secondary | ICD-10-CM

## 2014-04-06 NOTE — Telephone Encounter (Signed)
gv adn printed appts cehd adna vs for pt for July and Aut

## 2014-04-06 NOTE — Patient Instructions (Signed)
Smoking Cessation Quitting smoking is important to your health and has many advantages. However, it is not always easy to quit since nicotine is a very addictive drug. Often times, people try 3 times or more before being able to quit. This document explains the best ways for you to prepare to quit smoking. Quitting takes hard work and a lot of effort, but you can do it. ADVANTAGES OF QUITTING SMOKING  You will live longer, feel better, and live better.  Your body will feel the impact of quitting smoking almost immediately.  Within 20 minutes, blood pressure decreases. Your pulse returns to its normal level.  After 8 hours, carbon monoxide levels in the blood return to normal. Your oxygen level increases.  After 24 hours, the chance of having a heart attack starts to decrease. Your breath, hair, and body stop smelling like smoke.  After 48 hours, damaged nerve endings begin to recover. Your sense of taste and smell improve.  After 72 hours, the body is virtually free of nicotine. Your bronchial tubes relax and breathing becomes easier.  After 2 to 12 weeks, lungs can hold more air. Exercise becomes easier and circulation improves.  The risk of having a heart attack, stroke, cancer, or lung disease is greatly reduced.  After 1 year, the risk of coronary heart disease is cut in half.  After 5 years, the risk of stroke falls to the same as a nonsmoker.  After 10 years, the risk of lung cancer is cut in half and the risk of other cancers decreases significantly.  After 15 years, the risk of coronary heart disease drops, usually to the level of a nonsmoker.  If you are pregnant, quitting smoking will improve your chances of having a healthy baby.  The people you live with, especially any children, will be healthier.  You will have extra money to spend on things other than cigarettes. QUESTIONS TO THINK ABOUT BEFORE ATTEMPTING TO QUIT You may want to talk about your answers with your  caregiver.  Why do you want to quit?  If you tried to quit in the past, what helped and what did not?  What will be the most difficult situations for you after you quit? How will you plan to handle them?  Who can help you through the tough times? Your family? Friends? A caregiver?  What pleasures do you get from smoking? What ways can you still get pleasure if you quit? Here are some questions to ask your caregiver:  How can you help me to be successful at quitting?  What medicine do you think would be best for me and how should I take it?  What should I do if I need more help?  What is smoking withdrawal like? How can I get information on withdrawal? GET READY  Set a quit date.  Change your environment by getting rid of all cigarettes, ashtrays, matches, and lighters in your home, car, or work. Do not let people smoke in your home.  Review your past attempts to quit. Think about what worked and what did not. GET SUPPORT AND ENCOURAGEMENT You have a better chance of being successful if you have help. You can get support in many ways.  Tell your family, friends, and co-workers that you are going to quit and need their support. Ask them not to smoke around you.  Get individual, group, or telephone counseling and support. Programs are available at local hospitals and health centers. Call your local health department for   information about programs in your area.  Spiritual beliefs and practices may help some smokers quit.  Download a "quit meter" on your computer to keep track of quit statistics, such as how long you have gone without smoking, cigarettes not smoked, and money saved.  Get a self-help book about quitting smoking and staying off of tobacco. LEARN NEW SKILLS AND BEHAVIORS  Distract yourself from urges to smoke. Talk to someone, go for a walk, or occupy your time with a task.  Change your normal routine. Take a different route to work. Drink tea instead of coffee.  Eat breakfast in a different place.  Reduce your stress. Take a hot bath, exercise, or read a book.  Plan something enjoyable to do every day. Reward yourself for not smoking.  Explore interactive web-based programs that specialize in helping you quit. GET MEDICINE AND USE IT CORRECTLY Medicines can help you stop smoking and decrease the urge to smoke. Combining medicine with the above behavioral methods and support can greatly increase your chances of successfully quitting smoking.  Nicotine replacement therapy helps deliver nicotine to your body without the negative effects and risks of smoking. Nicotine replacement therapy includes nicotine gum, lozenges, inhalers, nasal sprays, and skin patches. Some may be available over-the-counter and others require a prescription.  Antidepressant medicine helps people abstain from smoking, but how this works is unknown. This medicine is available by prescription.  Nicotinic receptor partial agonist medicine simulates the effect of nicotine in your brain. This medicine is available by prescription. Ask your caregiver for advice about which medicines to use and how to use them based on your health history. Your caregiver will tell you what side effects to look out for if you choose to be on a medicine or therapy. Carefully read the information on the package. Do not use any other product containing nicotine while using a nicotine replacement product.  RELAPSE OR DIFFICULT SITUATIONS Most relapses occur within the first 3 months after quitting. Do not be discouraged if you start smoking again. Remember, most people try several times before finally quitting. You may have symptoms of withdrawal because your body is used to nicotine. You may crave cigarettes, be irritable, feel very hungry, cough often, get headaches, or have difficulty concentrating. The withdrawal symptoms are only temporary. They are strongest when you first quit, but they will go away within  10 14 days. To reduce the chances of relapse, try to:  Avoid drinking alcohol. Drinking lowers your chances of successfully quitting.  Reduce the amount of caffeine you consume. Once you quit smoking, the amount of caffeine in your body increases and can give you symptoms, such as a rapid heartbeat, sweating, and anxiety.  Avoid smokers because they can make you want to smoke.  Do not let weight gain distract you. Many smokers will gain weight when they quit, usually less than 10 pounds. Eat a healthy diet and stay active. You can always lose the weight gained after you quit.  Find ways to improve your mood other than smoking. FOR MORE INFORMATION  www.smokefree.gov  Document Released: 11/20/2001 Document Revised: 05/27/2012 Document Reviewed: 03/06/2012 [employer] Patient Information 2014 [employer].  

## 2014-04-06 NOTE — Progress Notes (Signed)
[facility] Telephone:([phone]   Fax:([phone]) [phone]  OFFICE PROGRESS NOTE  Jerlyn Ly, MD [place] [address]  DIAGNOSIS: Recurrent non-small cell lung cancer initially diagnosed as stage IIB (T3, N0, M0) non-small cell lung cancer consistent with invasive squamous cell carcinoma diagnosed in December of 2012.   PRIOR THERAPY:  1) Status post left upper lobectomy with lymph node dissection on 12/17/2011 under the care of Dr. Arlyce Dice.  2) status post palliative radiotherapy to recurrent disease along the left lateral chest well completed on 09/08/2013 under the care of Dr. Sondra Come.  CURRENT THERAPY: None.   CHEMOTHERAPY INTENT: N/A  CURRENT # OF CHEMOTHERAPY CYCLES: 0  CURRENT ANTIEMETICS: N/A  CURRENT SMOKING STATUS: Current smoker. Not willing to quit. Given counseling  ORAL CHEMOTHERAPY AND CONSENT: None  CURRENT BISPHOSPHONATES USE: None  PAIN MANAGEMENT: 3/10 left chest wall pain  NARCOTICS INDUCED CONSTIPATION: None  LIVING WILL AND CODE STATUS: Full code initially but not for long term resuscitation.   INTERVAL HISTORY: Andrea [name] 78 y.o. female returns to the clinic today for followup visit accompanied by her sister. The patient is feeling fine today with no specific complaints except for mild fatigue and shortness of breath. She denied having any significant weight loss or night sweats. She has no nausea or vomiting. She continues to smoke and I strongly encouraged her to quit smoking and offered her smoke cessation program but she still unwilling to quit. Her recent CT scan of the chest showed rounded subpleural soft tissue in the left lower lobe and disease recurrence was not excluded. I ordered a PET scan for further evaluation of this area and the patient is here today for discussion of her scan results and recommendation regarding her condition.  MEDICAL HISTORY: Past Medical History  Diagnosis Date  . Hypertension   .  PVD (peripheral vascular disease)   . History of shingles 1985  . Sleep related hypoxia     USES CPAP  . Tobacco abuse   . Complication of anesthesia     confusion after surgery  . COPD (chronic obstructive pulmonary disease)     Emphezma  . Hypoxia, sleep related     Oxygen 2 liters at night, HOB  approx 30-45 degree  . Arthritis     Hands  . DEMENTIA   . Cancer     l lung cancer  . Hearing loss   . Lung cancer 12/06/11    left upper lung  . Acute encephalopathy 09/19/2013  . History of radiation therapy 08/17/2013-09/08/2013    42.5 gray to left lateral chest    ALLERGIES:  has No Known Allergies.  MEDICATIONS:  Current Outpatient Prescriptions  Medication Sig Dispense Refill  . albuterol (PROVENTIL) (2.5 MG/3ML) 0.083% nebulizer solution Take 3 mLs (2.5 mg total) by nebulization every 4 (four) hours as needed for shortness of breath. Use nebs every 8 hours x 3 days then use as needed.  75 mL  0  . aspirin EC 81 MG tablet Take 81 mg by mouth every morning.       . cholecalciferol (VITAMIN D) 1000 UNITS tablet Take 2,000 Units by mouth every morning.       . Fluticasone-Salmeterol (ADVAIR) 250-50 MCG/DOSE AEPB Inhale 1 puff into the lungs every 12 (twelve) hours.        Marland Kitchen galantamine (RAZADYNE ER) 16 MG 24 hr capsule Take 16 mg by mouth every morning.      Marland Kitchen  guaiFENesin (MUCINEX) 600 MG 12 hr tablet Take 2 tablets (1,200 mg total) by mouth 2 (two) times daily. Use for 3 days then as needed.  20 tablet  0  . losartan (COZAAR) 50 MG tablet Take 50 mg by mouth every morning.      . lovastatin (MEVACOR) 40 MG tablet Take 40 mg by mouth every morning.       . memantine (NAMENDA) 10 MG tablet Take 10 mg by mouth 2 (two) times daily.      . Multiple Vitamin (MULITIVITAMIN WITH MINERALS) TABS Take 1 tablet by mouth every morning.       . Omega-3 Fatty Acids (FISH OIL) 1200 MG CAPS Take 1 capsule by mouth every morning.       . roflumilast (DALIRESP) 500 MCG TABS tablet Take 500 mcg by  mouth every morning.       . tiotropium (SPIRIVA) 18 MCG inhalation capsule Place 18 mcg into inhaler and inhale daily.       Marland Kitchen acetaminophen (TYLENOL) 500 MG tablet Take 500 mg by mouth 2 (two) times daily.      Marland Kitchen albuterol (PROVENTIL HFA;VENTOLIN HFA) 108 (90 BASE) MCG/ACT inhaler Inhale 2 puffs into the lungs every 4 (four) hours as needed for shortness of breath.       . cyclobenzaprine (FLEXERIL) 10 MG tablet Take 10 mg by mouth as needed for muscle spasms.      Marland Kitchen ibuprofen (ADVIL,MOTRIN) 200 MG tablet Take 200 mg by mouth 2 (two) times daily.       No current facility-administered medications for this visit.    SURGICAL HISTORY:  Past Surgical History  Procedure Laterality Date  . Appendectomy      1957  . Elbow surgery  2008    right elbow  . Tonsillectomy    . Lung biospy    . Lung removal, partial Left 12/16/12    left upper resection    REVIEW OF SYSTEMS:  A comprehensive review of systems was negative except for: Constitutional: positive for fatigue Respiratory: positive for dyspnea on exertion   PHYSICAL EXAMINATION: General appearance: alert, cooperative, fatigued and no distress Head: Normocephalic, without obvious abnormality, atraumatic Neck: no adenopathy Lymph nodes: Cervical, supraclavicular, and axillary nodes normal. Resp: clear to auscultation bilaterally Cardio: regular rate and rhythm, S1, S2 normal, no murmur, click, rub or gallop GI: soft, non-tender; bowel sounds normal; no masses,  no organomegaly Extremities: extremities normal, atraumatic, no cyanosis or edema  ECOG PERFORMANCE STATUS: 1 - Symptomatic but completely ambulatory  Blood pressure 125/63, pulse 91, temperature 97.9 F (36.6 C), temperature source Oral, resp. rate 20, height 5' (1.524 m), weight 160 lb 6.4 oz (72.757 kg), SpO2 100.00%.  LABORATORY DATA: Lab Results  Component Value Date   WBC 6.5 02/17/2014   HGB 13.5 02/17/2014   HCT 40.5 02/17/2014   MCV 94.1 02/17/2014   PLT 213  02/17/2014      Chemistry      Component Value Date/Time   NA 141 02/17/2014 1019   NA 134* 09/22/2013 0600   K 4.4 02/17/2014 1019   K 3.6 09/22/2013 0600   CL 96 09/22/2013 0600   CO2 26 02/17/2014 1019   CO2 31 09/22/2013 0600   BUN 13.4 02/17/2014 1019   BUN 12 09/22/2013 0600   CREATININE 0.8 02/17/2014 1019   CREATININE 0.53 09/22/2013 0600   CREATININE 0.76 11/28/2011 1220      Component Value Date/Time   CALCIUM 10.0 02/17/2014 1019  CALCIUM 9.7 09/22/2013 0600   ALKPHOS 48 02/17/2014 1019   ALKPHOS 58 01/07/2012 1349   AST 21 02/17/2014 1019   AST 14 01/07/2012 1349   ALT 16 02/17/2014 1019   ALT 9 01/07/2012 1349   BILITOT 0.73 02/17/2014 1019   BILITOT 0.5 01/07/2012 1349       RADIOGRAPHIC STUDIES: Nm Pet Image Restag (ps) Skull Base To Thigh  03/04/2014   CLINICAL DATA:  Subsequent treatment strategy for lung carcinoma. Query recurrence in left hemi thorax.  EXAM: NUCLEAR MEDICINE PET SKULL BASE TO THIGH  TECHNIQUE: 9.4 mCi F-18 FDG was injected intravenously. Full-ring PET imaging was performed from the skull base to thigh after the radiotracer. CT data was obtained and used for attenuation correction and anatomic localization.  FASTING BLOOD GLUCOSE:  Value: 87 mg/dl  COMPARISON:  PET-CT 07/20/2013, CT 02/17/2014  FINDINGS: NECK  No hypermetabolic lymph nodes in the neck.  CHEST  Focus of hypermetabolic activity along the left lateral chest wall associated with the nodular pleural parenchymal thickening (image 65) with SUV max equal 5.0. More medial portion of this pleural-parenchymal thickening is less hypermetabolic. . The activity along the left chest wall is much less intense than a recurrence on comparison PET-CT scan of 07/20/2013 where the vessel activity was SUV max equals 16.0.  No hypermetabolic pulmonary nodules. No hypermetabolic mediastinal lymph nodes.  ABDOMEN/PELVIS  No abnormal hypermetabolic activity within the liver, pancreas, adrenal glands, or spleen. No  hypermetabolic lymph nodes in the abdomen or pelvis.  SKELETON  No focal hypermetabolic activity to suggest skeletal metastasis.  IMPRESSION: 1. Moderate hypermetabolic activity associated with the pleural-parenchymal thickening in the left lateral hemi thorax along the chest wall is much less metabolically active than chest wall recurrence on comparison PET-CT of 07/20/2013. Therefore favor inflammatory change rather than recurrence although cannot completely exclude recurrence without biopsy. 2. No additional evidence of metastatic disease.   Electronically Signed   By: Suzy Bouchard M.D.   On: 03/04/2014 16:06   ASSESSMENT AND PLAN: This is a very pleasant 78 years old white female with questionable recurrent non-small cell lung cancer presented with hypermetabolic nodular thickening associated with rib destruction along the left chest wall. She status post palliative radiotherapy to that area under the care of Dr. Sondra Come completed on 09/08/2013. Her last CT scan of the chest showed no significant evidence for disease progression but there was  masslike consolidation within the anterior left lung. The recent PET scan showed moderate hypermetabolic activity in this area but is much less metabolically active than the chest will recurrence in August of 2014. This is suspicious for inflammatory changes rather than recurrence. I discussed the PET scan results with the patient and her sister. I recommended for her to continue on observation with repeat CT scan of the chest in 3 months. I strongly advise her again to quit smoking and offered her smoke cessation program. The patient was advised to call immediately if she has any concerning symptoms in the interval. The patient voices understanding of current disease status and treatment options and is in agreement with the current care plan.  All questions were answered. The patient knows to call the clinic with any problems, questions or concerns. We can  certainly see the patient much sooner if necessary.  Disclaimer: This note was dictated with voice recognition software. Similar sounding words can inadvertently be transcribed and may not be corrected upon review.

## 2014-07-06 ENCOUNTER — Ambulatory Visit (HOSPITAL_COMMUNITY)
Admission: RE | Admit: 2014-07-06 | Discharge: 2014-07-06 | Disposition: A | Discharge status: Home / Self Care | Payer: Medicare Other | Source: Ambulatory Visit | Attending: Internal Medicine | Admitting: Internal Medicine

## 2014-07-06 ENCOUNTER — Encounter (HOSPITAL_COMMUNITY): Payer: Self-pay

## 2014-07-06 ENCOUNTER — Other Ambulatory Visit (HOSPITAL_BASED_OUTPATIENT_CLINIC_OR_DEPARTMENT_OTHER): Payer: Medicare Other

## 2014-07-06 DIAGNOSIS — I700 Atherosclerosis of aorta: Secondary | ICD-10-CM | POA: Diagnosis not present

## 2014-07-06 DIAGNOSIS — Z923 Personal history of irradiation: Secondary | ICD-10-CM | POA: Diagnosis not present

## 2014-07-06 DIAGNOSIS — C50919 Malignant neoplasm of unspecified site of unspecified female breast: Secondary | ICD-10-CM

## 2014-07-06 DIAGNOSIS — J438 Other emphysema: Secondary | ICD-10-CM | POA: Insufficient documentation

## 2014-07-06 DIAGNOSIS — C3490 Malignant neoplasm of unspecified part of unspecified bronchus or lung: Secondary | ICD-10-CM | POA: Diagnosis present

## 2014-07-06 DIAGNOSIS — C779 Secondary and unspecified malignant neoplasm of lymph node, unspecified: Secondary | ICD-10-CM

## 2014-07-06 DIAGNOSIS — C3410 Malignant neoplasm of upper lobe, unspecified bronchus or lung: Secondary | ICD-10-CM

## 2014-07-06 DIAGNOSIS — Z902 Acquired absence of lung [part of]: Secondary | ICD-10-CM | POA: Diagnosis not present

## 2014-07-06 LAB — CBC WITH DIFFERENTIAL/PLATELET
BASO%: 1.6 % (ref 0.0–2.0)
BASOS ABS: 0.1 10*3/uL (ref 0.0–0.1)
EOS ABS: 0.1 10*3/uL (ref 0.0–0.5)
EOS%: 1.8 % (ref 0.0–7.0)
HEMATOCRIT: 41.3 % (ref 34.8–46.6)
HEMOGLOBIN: 13.7 g/dL (ref 11.6–15.9)
LYMPH#: 1.9 10*3/uL (ref 0.9–3.3)
LYMPH%: 28.9 % (ref 14.0–49.7)
MCH: 32.3 pg (ref 25.1–34.0)
MCHC: 33.3 g/dL (ref 31.5–36.0)
MCV: 97.1 fL (ref 79.5–101.0)
MONO#: 0.5 10*3/uL (ref 0.1–0.9)
MONO%: 7.1 % (ref 0.0–14.0)
NEUT#: 4 10*3/uL (ref 1.5–6.5)
NEUT%: 60.6 % (ref 38.4–76.8)
Platelets: 248 10*3/uL (ref 145–400)
RBC: 4.25 10*6/uL (ref 3.70–5.45)
RDW: 13.9 % (ref 11.2–14.5)
WBC: 6.6 10*3/uL (ref 3.9–10.3)

## 2014-07-06 LAB — COMPREHENSIVE METABOLIC PANEL (CC13)
ALBUMIN: 3.7 g/dL (ref 3.5–5.0)
ALT: 14 U/L (ref 0–55)
ANION GAP: 7 meq/L (ref 3–11)
AST: 18 U/L (ref 5–34)
Alkaline Phosphatase: 38 U/L — ABNORMAL LOW (ref 40–150)
BUN: 9.9 mg/dL (ref 7.0–26.0)
CALCIUM: 9.7 mg/dL (ref 8.4–10.4)
CHLORIDE: 101 meq/L (ref 98–109)
CO2: 28 mEq/L (ref 22–29)
Creatinine: 0.7 mg/dL (ref 0.6–1.1)
Glucose: 104 mg/dl (ref 70–140)
Potassium: 4.3 mEq/L (ref 3.5–5.1)
Sodium: 136 mEq/L (ref 136–145)
Total Bilirubin: 0.64 mg/dL (ref 0.20–1.20)
Total Protein: 7.3 g/dL (ref 6.4–8.3)

## 2014-07-06 MED ORDER — IOHEXOL 300 MG/ML  SOLN
80.0000 mL | Freq: Once | INTRAMUSCULAR | Status: AC | PRN
  Administered 2014-07-06: 80 mL via INTRAVENOUS

## 2014-07-13 ENCOUNTER — Telehealth: Payer: Self-pay | Admitting: Internal Medicine

## 2014-07-13 ENCOUNTER — Encounter: Payer: Self-pay | Admitting: Internal Medicine

## 2014-07-13 ENCOUNTER — Ambulatory Visit (HOSPITAL_BASED_OUTPATIENT_CLINIC_OR_DEPARTMENT_OTHER): Payer: Medicare Other | Admitting: Internal Medicine

## 2014-07-13 VITALS — BP 115/61 | HR 79 | Temp 97.6°F | Resp 18 | Ht 60.0 in | Wt 158.5 lb

## 2014-07-13 DIAGNOSIS — F17200 Nicotine dependence, unspecified, uncomplicated: Secondary | ICD-10-CM

## 2014-07-13 DIAGNOSIS — C3412 Malignant neoplasm of upper lobe, left bronchus or lung: Secondary | ICD-10-CM

## 2014-07-13 DIAGNOSIS — R071 Chest pain on breathing: Secondary | ICD-10-CM

## 2014-07-13 DIAGNOSIS — C3410 Malignant neoplasm of upper lobe, unspecified bronchus or lung: Secondary | ICD-10-CM

## 2014-07-13 NOTE — Progress Notes (Signed)
[facility] [phone]   [phone]  OFFICE PROGRESS NOTE  Jerlyn Ly, MD [place] [address]  DIAGNOSIS: Recurrent non-small cell lung cancer initially diagnosed as stage IIB (T3, N0, M0) non-small cell lung cancer consistent with invasive squamous cell carcinoma diagnosed in December of 2012.   PRIOR THERAPY:  1) Status post left upper lobectomy with lymph node dissection on 12/17/2011 under the care of Dr. Arlyce Dice.  2) status post palliative radiotherapy to recurrent disease along the left lateral chest well completed on 09/08/2013 under the care of Dr. Sondra Come.  CURRENT THERAPY: Observation.   CHEMOTHERAPY INTENT: N/A  CURRENT # OF CHEMOTHERAPY CYCLES: 0  CURRENT ANTIEMETICS: N/A  CURRENT SMOKING STATUS: Current smoker. Not willing to quit. Given counseling  ORAL CHEMOTHERAPY AND CONSENT: None  CURRENT BISPHOSPHONATES USE: None  PAIN MANAGEMENT: 3/10 left chest wall pain  NARCOTICS INDUCED CONSTIPATION: None  LIVING WILL AND CODE STATUS: Full code initially but not for long term resuscitation.   INTERVAL HISTORY: Andrea [name] 78 y.o. female returns to the clinic today for followup visit accompanied by her sister. The patient is feeling fine today with no specific complaints except for shortness of breath. She denied having any significant weight loss or night sweats. She has no nausea or vomiting. She continues to smoke and I strongly encouraged her to quit smoking and offered her smoke cessation program but she is still unwilling to quit. She had repeat CT scan of the chest performed recently and she is here for evaluation and discussion of her scan results.   MEDICAL HISTORY: Past Medical History  Diagnosis Date  . Hypertension   . PVD (peripheral vascular disease)   . History of shingles 1985  . Sleep related hypoxia     USES CPAP  . Tobacco abuse   . Complication of anesthesia     confusion after  surgery  . COPD (chronic obstructive pulmonary disease)     Emphezma  . Hypoxia, sleep related     Oxygen 2 liters at night, HOB  approx 30-45 degree  . Arthritis     Hands  . DEMENTIA   . Cancer     l lung cancer  . Hearing loss   . Lung cancer 12/06/11    left upper lung  . Acute encephalopathy 09/19/2013  . History of radiation therapy 08/17/2013-09/08/2013    42.5 gray to left lateral chest    ALLERGIES:  has No Known Allergies.  MEDICATIONS:  Current Outpatient Prescriptions  Medication Sig Dispense Refill  . acetaminophen (TYLENOL) 500 MG tablet Take 500 mg by mouth 2 (two) times daily.      Marland Kitchen albuterol (PROVENTIL HFA;VENTOLIN HFA) 108 (90 BASE) MCG/ACT inhaler Inhale 2 puffs into the lungs every 4 (four) hours as needed for shortness of breath.       Marland Kitchen albuterol (PROVENTIL) (2.5 MG/3ML) 0.083% nebulizer solution Take 3 mLs (2.5 mg total) by nebulization every 4 (four) hours as needed for shortness of breath. Use nebs every 8 hours x 3 days then use as needed.  75 mL  0  . aspirin EC 81 MG tablet Take 81 mg by mouth every morning.       . cholecalciferol (VITAMIN D) 1000 UNITS tablet Take 2,000 Units by mouth every morning.       . cyclobenzaprine (FLEXERIL) 10 MG tablet Take 10 mg by mouth as needed for muscle spasms.      Marland Kitchen  Fluticasone-Salmeterol (ADVAIR) 250-50 MCG/DOSE AEPB Inhale 1 puff into the lungs every 12 (twelve) hours.        Marland Kitchen galantamine (RAZADYNE ER) 16 MG 24 hr capsule Take 16 mg by mouth every morning.      Marland Kitchen guaiFENesin (MUCINEX) 600 MG 12 hr tablet Take 2 tablets (1,200 mg total) by mouth 2 (two) times daily. Use for 3 days then as needed.  20 tablet  0  . ibuprofen (ADVIL,MOTRIN) 200 MG tablet Take 200 mg by mouth 2 (two) times daily.      Marland Kitchen losartan (COZAAR) 50 MG tablet Take 50 mg by mouth every morning.      . lovastatin (MEVACOR) 40 MG tablet Take 40 mg by mouth every morning.       . memantine (NAMENDA) 10 MG tablet Take 10 mg by mouth 2 (two) times  daily.      . Multiple Vitamin (MULITIVITAMIN WITH MINERALS) TABS Take 1 tablet by mouth every morning.       . Omega-3 Fatty Acids (FISH OIL) 1200 MG CAPS Take 1 capsule by mouth every morning.       . roflumilast (DALIRESP) 500 MCG TABS tablet Take 500 mcg by mouth every morning.       . tiotropium (SPIRIVA) 18 MCG inhalation capsule Place 18 mcg into inhaler and inhale daily.        No current facility-administered medications for this visit.    SURGICAL HISTORY:  Past Surgical History  Procedure Laterality Date  . Appendectomy      1957  . Elbow surgery  2008    right elbow  . Tonsillectomy    . Lung biospy    . Lung removal, partial Left 12/16/12    left upper resection    REVIEW OF SYSTEMS:  A comprehensive review of systems was negative except for: Constitutional: positive for fatigue Respiratory: positive for dyspnea on exertion   PHYSICAL EXAMINATION: General appearance: alert, cooperative, fatigued and no distress Head: Normocephalic, without obvious abnormality, atraumatic Neck: no adenopathy Lymph nodes: Cervical, supraclavicular, and axillary nodes normal. Resp: clear to auscultation bilaterally Cardio: regular rate and rhythm, S1, S2 normal, no murmur, click, rub or gallop GI: soft, non-tender; bowel sounds normal; no masses,  no organomegaly Extremities: extremities normal, atraumatic, no cyanosis or edema  ECOG PERFORMANCE STATUS: 1 - Symptomatic but completely ambulatory  There were no vitals taken for this visit.  LABORATORY DATA: Lab Results  Component Value Date   WBC 6.6 07/06/2014   HGB 13.7 07/06/2014   HCT 41.3 07/06/2014   MCV 97.1 07/06/2014   PLT 248 07/06/2014      Chemistry      Component Value Date/Time   NA 136 07/06/2014 1005   NA 134* 09/22/2013 0600   K 4.3 07/06/2014 1005   K 3.6 09/22/2013 0600   CL 96 09/22/2013 0600   CO2 28 07/06/2014 1005   CO2 31 09/22/2013 0600   BUN 9.9 07/06/2014 1005   BUN 12 09/22/2013 0600   CREATININE  0.7 07/06/2014 1005   CREATININE 0.53 09/22/2013 0600   CREATININE 0.76 11/28/2011 1220      Component Value Date/Time   CALCIUM 9.7 07/06/2014 1005   CALCIUM 9.7 09/22/2013 0600   ALKPHOS 38* 07/06/2014 1005   ALKPHOS 58 01/07/2012 1349   AST 18 07/06/2014 1005   AST 14 01/07/2012 1349   ALT 14 07/06/2014 1005   ALT 9 01/07/2012 1349   BILITOT 0.64 07/06/2014 1005   BILITOT  0.5 01/07/2012 1349       RADIOGRAPHIC STUDIES: Ct Chest W Contrast  07/06/2014   CLINICAL DATA:  Lung cancer diagnosed in September 2012. History of partial lung resection and completion of radiation therapy last year.  EXAM: CT CHEST WITH CONTRAST  TECHNIQUE: Multidetector CT imaging of the chest was performed during intravenous contrast administration.  CONTRAST:  15mL OMNIPAQUE IOHEXOL 300 MG/ML  SOLN  COMPARISON:  PET-CT 03/04/2014.  Chest CT 02/17/2014.  FINDINGS: Mediastinum: There are no enlarged mediastinal, hilar or axillary lymph nodes. The thyroid gland, trachea and esophagus appear normal. The heart size is normal. Diffuse atherosclerosis of the aorta, great vessels and coronary arteries appears unchanged.  Lungs/Pleura: There is no pleural or pericardial effusion.Patient is status post left upper lobe resection. The peripheral subpleural scarring in the left mid lung is grossly stable and favored to reflect postradiation fibrosis based on stability. There is no chest wall extension. There are no suspicious pulmonary nodules. Mild emphysematous changes are stable.  Upper abdomen: Stable mild prominence of the left adrenal gland. There is no adrenal mass. Vascular calcifications are noted.  Musculoskeletal/Chest wall: Probable radiation changes within the left lateral chest wall are stable without evidence of focal mass. There is no rib destruction or other suspicious osseous finding.  IMPRESSION: 1. Stable postoperative appearance of the chest status post left upper lobe resection. 2. Subpleural scarring peripherally in  the left lower lobe appears unchanged. 3. No evidence of local recurrence or metastatic disease. 4. Diffuse atherosclerosis.   Electronically Signed   By: Camie Patience M.D.   On: 07/06/2014 14:34   ASSESSMENT AND PLAN: This is a very pleasant 78 years old white female with questionable recurrent non-small cell lung cancer presented with hypermetabolic nodular thickening associated with rib destruction along the left chest wall. She status post palliative radiotherapy to that area under the care of Dr. Sondra Come completed on 09/08/2013. The recent CT scan of the chest showed no evidence for disease progression. I discussed the scan results with the patient and her sister. I recommended for her to continue on observation with repeat CT scan of the chest in 6 months. I strongly advise her again to quit smoking and offered her smoke cessation program but again the patient is unwilling to quit smoking. The patient was advised to call immediately if she has any concerning symptoms in the interval. The patient voices understanding of current disease status and treatment options and is in agreement with the current care plan.  All questions were answered. The patient knows to call the clinic with any problems, questions or concerns. We can certainly see the patient much sooner if necessary.  Disclaimer: This note was dictated with voice recognition software. Similar sounding words can inadvertently be transcribed and may not be corrected upon review.

## 2014-07-13 NOTE — Patient Instructions (Signed)
Smoking Cessation, Tips for Success  If you are ready to quit smoking, congratulations! You have chosen to help yourself be healthier. Cigarettes bring nicotine, tar, carbon monoxide, and other irritants into your body. Your lungs, heart, and blood vessels will be able to work better without these poisons. There are many different ways to quit smoking. Nicotine gum, nicotine patches, a nicotine inhaler, or nicotine nasal spray can help with physical craving. Hypnosis, support groups, and medicines help break the habit of smoking.  WHAT THINGS CAN I DO TO MAKE QUITTING EASIER?   Here are some tips to help you quit for good:  · Pick a date when you will quit smoking completely. Tell all of your friends and family about your plan to quit on that date.  · Do not try to slowly cut down on the number of cigarettes you are smoking. Pick a quit date and quit smoking completely starting on that day.  · Throw away all cigarettes.    · Clean and remove all ashtrays from your home, work, and car.  · On a card, write down your reasons for quitting. Carry the card with you and read it when you get the urge to smoke.  · Cleanse your body of nicotine. Drink enough water and fluids to keep your urine clear or pale yellow. Do this after quitting to flush the nicotine from your body.  · Learn to predict your moods. Do not let a bad situation be your excuse to have a cigarette. Some situations in your life might tempt you into wanting a cigarette.  · Never have "just one" cigarette. It leads to wanting another and another. Remind yourself of your decision to quit.  · Change habits associated with smoking. If you smoked while driving or when feeling stressed, try other activities to replace smoking. Stand up when drinking your coffee. Brush your teeth after eating. Sit in a different chair when you read the paper. Avoid alcohol while trying to quit, and try to drink fewer caffeinated beverages. Alcohol and caffeine may urge you to  smoke.  · Avoid foods and drinks that can trigger a desire to smoke, such as sugary or spicy foods and alcohol.  · Ask people who smoke not to smoke around you.  · Have something planned to do right after eating or having a cup of coffee. For example, plan to take a walk or exercise.  · Try a relaxation exercise to calm you down and decrease your stress. Remember, you may be tense and nervous for the first 2 weeks after you quit, but this will pass.  · Find new activities to keep your hands busy. Play with a pen, coin, or rubber band. Doodle or draw things on paper.  · Brush your teeth right after eating. This will help cut down on the craving for the taste of tobacco after meals. You can also try mouthwash.    · Use oral substitutes in place of cigarettes. Try using lemon drops, carrots, cinnamon sticks, or chewing gum. Keep them handy so they are available when you have the urge to smoke.  · When you have the urge to smoke, try deep breathing.  · Designate your home as a nonsmoking area.  · If you are a heavy smoker, ask your health care provider about a prescription for nicotine chewing gum. It can ease your withdrawal from nicotine.  · Reward yourself. Set aside the cigarette money you save and buy yourself something nice.  · Look for   support from others. Join a support group or smoking cessation program. Ask someone at home or at work to help you with your plan to quit smoking.  · Always ask yourself, "Do I need this cigarette or is this just a reflex?" Tell yourself, "Today, I choose not to smoke," or "I do not want to smoke." You are reminding yourself of your decision to quit.  · Do not replace cigarette smoking with electronic cigarettes (commonly called e-cigarettes). The safety of e-cigarettes is unknown, and some may contain harmful chemicals.  · If you relapse, do not give up! Plan ahead and think about what you will do the next time you get the urge to smoke.  HOW WILL I FEEL WHEN I QUIT SMOKING?  You  may have symptoms of withdrawal because your body is used to nicotine (the addictive substance in cigarettes). You may crave cigarettes, be irritable, feel very hungry, cough often, get headaches, or have difficulty concentrating. The withdrawal symptoms are only temporary. They are strongest when you first quit but will go away within 10-14 days. When withdrawal symptoms occur, stay in control. Think about your reasons for quitting. Remind yourself that these are signs that your body is healing and getting used to being without cigarettes. Remember that withdrawal symptoms are easier to treat than the major diseases that smoking can cause.   Even after the withdrawal is over, expect periodic urges to smoke. However, these cravings are generally short lived and will go away whether you smoke or not. Do not smoke!  WHAT RESOURCES ARE AVAILABLE TO HELP ME QUIT SMOKING?  Your health care provider can direct you to community resources or hospitals for support, which may include:  · Group support.  · Education.  · Hypnosis.  · Therapy.  Document Released: 08/24/2004 Document Revised: 04/12/2014 Document Reviewed: 05/14/2013  [employer]® Patient Information ©2015 [employer]. This information is not intended to replace advice given to you by your health care provider. Make sure you discuss any questions you have with your health care provider.

## 2014-07-13 NOTE — Telephone Encounter (Signed)
gv pt appt schedule for feb 2016. [facility] will [facility] pt [facility]ct.

## 2015-01-10 ENCOUNTER — Ambulatory Visit (HOSPITAL_COMMUNITY)
Admission: RE | Admit: 2015-01-10 | Discharge: 2015-01-10 | Disposition: A | Discharge status: Home / Self Care | Payer: Medicare Other | Source: Ambulatory Visit | Attending: Internal Medicine | Admitting: Internal Medicine

## 2015-01-10 ENCOUNTER — Other Ambulatory Visit (HOSPITAL_BASED_OUTPATIENT_CLINIC_OR_DEPARTMENT_OTHER): Payer: Medicare Other

## 2015-01-10 ENCOUNTER — Encounter (HOSPITAL_COMMUNITY): Payer: Self-pay

## 2015-01-10 DIAGNOSIS — C3412 Malignant neoplasm of upper lobe, left bronchus or lung: Secondary | ICD-10-CM

## 2015-01-10 DIAGNOSIS — C3490 Malignant neoplasm of unspecified part of unspecified bronchus or lung: Secondary | ICD-10-CM | POA: Diagnosis not present

## 2015-01-10 LAB — CBC WITH DIFFERENTIAL/PLATELET
BASO%: 1.1 % (ref 0.0–2.0)
Basophils Absolute: 0.1 10*3/uL (ref 0.0–0.1)
EOS%: 2.2 % (ref 0.0–7.0)
Eosinophils Absolute: 0.2 10*3/uL (ref 0.0–0.5)
HCT: 40.8 % (ref 34.8–46.6)
HGB: 13.3 g/dL (ref 11.6–15.9)
LYMPH%: 27.5 % (ref 14.0–49.7)
MCH: 31.2 pg (ref 25.1–34.0)
MCHC: 32.7 g/dL (ref 31.5–36.0)
MCV: 95.5 fL (ref 79.5–101.0)
MONO#: 0.6 10*3/uL (ref 0.1–0.9)
MONO%: 8.2 % (ref 0.0–14.0)
NEUT#: 4.4 10*3/uL (ref 1.5–6.5)
NEUT%: 61 % (ref 38.4–76.8)
Platelets: 270 10*3/uL (ref 145–400)
RBC: 4.27 10*6/uL (ref 3.70–5.45)
RDW: 13.8 % (ref 11.2–14.5)
WBC: 7.3 10*3/uL (ref 3.9–10.3)
lymph#: 2 10*3/uL (ref 0.9–3.3)

## 2015-01-10 LAB — COMPREHENSIVE METABOLIC PANEL (CC13)
ALT: 13 U/L (ref 0–55)
AST: 18 U/L (ref 5–34)
Albumin: 3.7 g/dL (ref 3.5–5.0)
Alkaline Phosphatase: 49 U/L (ref 40–150)
Anion Gap: 9 mEq/L (ref 3–11)
BUN: 12.2 mg/dL (ref 7.0–26.0)
CO2: 28 mEq/L (ref 22–29)
Calcium: 9.5 mg/dL (ref 8.4–10.4)
Chloride: 101 mEq/L (ref 98–109)
Creatinine: 0.7 mg/dL (ref 0.6–1.1)
EGFR: 84 mL/min/{1.73_m2} — ABNORMAL LOW (ref >90)
Glucose: 84 mg/dl (ref 70–140)
Potassium: 4.5 mEq/L (ref 3.5–5.1)
Sodium: 138 mEq/L (ref 136–145)
Total Bilirubin: 0.56 mg/dL (ref 0.20–1.20)
Total Protein: 7.4 g/dL (ref 6.4–8.3)

## 2015-01-10 MED ORDER — IOHEXOL 300 MG/ML  SOLN
80.0000 mL | Freq: Once | INTRAMUSCULAR | Status: AC | PRN
  Administered 2015-01-10: 80 mL via INTRAVENOUS

## 2015-01-17 ENCOUNTER — Ambulatory Visit (HOSPITAL_BASED_OUTPATIENT_CLINIC_OR_DEPARTMENT_OTHER): Payer: Medicare Other | Admitting: Internal Medicine

## 2015-01-17 ENCOUNTER — Encounter: Payer: Self-pay | Admitting: Internal Medicine

## 2015-01-17 ENCOUNTER — Telehealth: Payer: Self-pay | Admitting: Internal Medicine

## 2015-01-17 VITALS — BP 97/75 | HR 76 | Temp 98.2°F | Resp 18 | Ht 60.0 in | Wt 162.3 lb

## 2015-01-17 DIAGNOSIS — C3412 Malignant neoplasm of upper lobe, left bronchus or lung: Secondary | ICD-10-CM

## 2015-01-17 DIAGNOSIS — C7989 Secondary malignant neoplasm of other specified sites: Secondary | ICD-10-CM

## 2015-01-17 DIAGNOSIS — M899 Disorder of bone, unspecified: Secondary | ICD-10-CM

## 2015-01-17 NOTE — Telephone Encounter (Signed)
Pt confirmed MD visit per 02/08 POF, gave pt AVS.... KJ

## 2015-01-17 NOTE — Patient Instructions (Signed)
Smoking Cessation, Tips for Success  If you are ready to quit smoking, congratulations! You have chosen to help yourself be healthier. Cigarettes bring nicotine, tar, carbon monoxide, and other irritants into your body. Your lungs, heart, and blood vessels will be able to work better without these poisons. There are many different ways to quit smoking. Nicotine gum, nicotine patches, a nicotine inhaler, or nicotine nasal spray can help with physical craving. Hypnosis, support groups, and medicines help break the habit of smoking.  WHAT THINGS CAN I DO TO MAKE QUITTING EASIER?   Here are some tips to help you quit for good:  · Pick a date when you will quit smoking completely. Tell all of your friends and family about your plan to quit on that date.  · Do not try to slowly cut down on the number of cigarettes you are smoking. Pick a quit date and quit smoking completely starting on that day.  · Throw away all cigarettes.    · Clean and remove all ashtrays from your home, work, and car.  · On a card, write down your reasons for quitting. Carry the card with you and read it when you get the urge to smoke.  · Cleanse your body of nicotine. Drink enough water and fluids to keep your urine clear or pale yellow. Do this after quitting to flush the nicotine from your body.  · Learn to predict your moods. Do not let a bad situation be your excuse to have a cigarette. Some situations in your life might tempt you into wanting a cigarette.  · Never have "just one" cigarette. It leads to wanting another and another. Remind yourself of your decision to quit.  · Change habits associated with smoking. If you smoked while driving or when feeling stressed, try other activities to replace smoking. Stand up when drinking your coffee. Brush your teeth after eating. Sit in a different chair when you read the paper. Avoid alcohol while trying to quit, and try to drink fewer caffeinated beverages. Alcohol and caffeine may urge you to  smoke.  · Avoid foods and drinks that can trigger a desire to smoke, such as sugary or spicy foods and alcohol.  · Ask people who smoke not to smoke around you.  · Have something planned to do right after eating or having a cup of coffee. For example, plan to take a walk or exercise.  · Try a relaxation exercise to calm you down and decrease your stress. Remember, you may be tense and nervous for the first 2 weeks after you quit, but this will pass.  · Find new activities to keep your hands busy. Play with a pen, coin, or rubber band. Doodle or draw things on paper.  · Brush your teeth right after eating. This will help cut down on the craving for the taste of tobacco after meals. You can also try mouthwash.    · Use oral substitutes in place of cigarettes. Try using lemon drops, carrots, cinnamon sticks, or chewing gum. Keep them handy so they are available when you have the urge to smoke.  · When you have the urge to smoke, try deep breathing.  · Designate your home as a nonsmoking area.  · If you are a heavy smoker, ask your health care provider about a prescription for nicotine chewing gum. It can ease your withdrawal from nicotine.  · Reward yourself. Set aside the cigarette money you save and buy yourself something nice.  · Look for   support from others. Join a support group or smoking cessation program. Ask someone at home or at work to help you with your plan to quit smoking.  · Always ask yourself, "Do I need this cigarette or is this just a reflex?" Tell yourself, "Today, I choose not to smoke," or "I do not want to smoke." You are reminding yourself of your decision to quit.  · Do not replace cigarette smoking with electronic cigarettes (commonly called e-cigarettes). The safety of e-cigarettes is unknown, and some may contain harmful chemicals.  · If you relapse, do not give up! Plan ahead and think about what you will do the next time you get the urge to smoke.  HOW WILL I FEEL WHEN I QUIT SMOKING?  You  may have symptoms of withdrawal because your body is used to nicotine (the addictive substance in cigarettes). You may crave cigarettes, be irritable, feel very hungry, cough often, get headaches, or have difficulty concentrating. The withdrawal symptoms are only temporary. They are strongest when you first quit but will go away within 10-14 days. When withdrawal symptoms occur, stay in control. Think about your reasons for quitting. Remind yourself that these are signs that your body is healing and getting used to being without cigarettes. Remember that withdrawal symptoms are easier to treat than the major diseases that smoking can cause.   Even after the withdrawal is over, expect periodic urges to smoke. However, these cravings are generally short lived and will go away whether you smoke or not. Do not smoke!  WHAT RESOURCES ARE AVAILABLE TO HELP ME QUIT SMOKING?  Your health care provider can direct you to community resources or hospitals for support, which may include:  · Group support.  · Education.  · Hypnosis.  · Therapy.  Document Released: 08/24/2004 Document Revised: 04/12/2014 Document Reviewed: 05/14/2013  [employer]® Patient Information ©2015 [employer]. This information is not intended to replace advice given to you by your health care provider. Make sure you discuss any questions you have with your health care provider.

## 2015-01-17 NOTE — Progress Notes (Signed)
[place] [phone]   [phone]  OFFICE PROGRESS NOTE  Jerlyn Ly, MD [place] [address]  DIAGNOSIS: Recurrent non-small cell lung cancer initially diagnosed as stage IIB (T3, N0, M0) non-small cell lung cancer consistent with invasive squamous cell carcinoma diagnosed in December of 2012.   PRIOR THERAPY:  1) Status post left upper lobectomy with lymph node dissection on 12/17/2011 under the care of Dr. Arlyce Dice.  2) status post palliative radiotherapy to recurrent disease along the left lateral chest well completed on 09/08/2013 under the care of Dr. Sondra Come.  CURRENT THERAPY: Observation.   CHEMOTHERAPY INTENT: N/A  CURRENT # OF CHEMOTHERAPY CYCLES: 0  CURRENT ANTIEMETICS: N/A  CURRENT SMOKING STATUS: Current smoker. Not willing to quit. Given counseling  ORAL CHEMOTHERAPY AND CONSENT: None  CURRENT BISPHOSPHONATES USE: None  PAIN MANAGEMENT: 3/10 left chest wall pain  NARCOTICS INDUCED CONSTIPATION: None  LIVING WILL AND CODE STATUS: Full code initially but not for long term resuscitation.   INTERVAL HISTORY: Andrea [name] 79 y.o. female returns to the clinic today for followup visit accompanied by her sister. The patient is feeling fine today with no specific complaints except for shortness of breath. She has intermittent left-sided chest pain. She denied having any significant weight loss or night sweats. She has no nausea or vomiting. She continues to smoke and unwilling to quit. She had repeat CT scan of the chest performed recently and she is here for evaluation and discussion of her scan results.   MEDICAL HISTORY: Past Medical History  Diagnosis Date  . Hypertension   . PVD (peripheral vascular disease)   . History of shingles 1985  . Sleep related hypoxia     USES CPAP  . Tobacco abuse   . Complication of anesthesia     confusion after surgery  . COPD (chronic obstructive pulmonary disease)      Emphezma  . Hypoxia, sleep related     Oxygen 2 liters at night, HOB  approx 30-45 degree  . Arthritis     Hands  . DEMENTIA   . Hearing loss   . Acute encephalopathy 09/19/2013  . History of radiation therapy 08/17/2013-09/08/2013    42.5 gray to left lateral chest  . Cancer     l lung cancer  . Lung cancer 12/06/11    left upper lung    ALLERGIES:  has No Known Allergies.  MEDICATIONS:  Current Outpatient Prescriptions  Medication Sig Dispense Refill  . acetaminophen (TYLENOL) 500 MG tablet Take 500 mg by mouth 2 (two) times daily.    Marland Kitchen albuterol (PROVENTIL HFA;VENTOLIN HFA) 108 (90 BASE) MCG/ACT inhaler Inhale 2 puffs into the lungs every 4 (four) hours as needed for shortness of breath.     Marland Kitchen albuterol (PROVENTIL) (2.5 MG/3ML) 0.083% nebulizer solution Take 3 mLs (2.5 mg total) by nebulization every 4 (four) hours as needed for shortness of breath. Use nebs every 8 hours x 3 days then use as needed. 75 mL 0  . aspirin EC 81 MG tablet Take 81 mg by mouth every morning.     . cholecalciferol (VITAMIN D) 1000 UNITS tablet Take 2,000 Units by mouth every morning.     . Fluticasone-Salmeterol (ADVAIR) 250-50 MCG/DOSE AEPB Inhale 1 puff into the lungs every 12 (twelve) hours.      Marland Kitchen galantamine (RAZADYNE ER) 16 MG 24 hr capsule Take 16 mg by mouth every morning.    Marland Kitchen guaiFENesin (  MUCINEX) 600 MG 12 hr tablet Take 2 tablets (1,200 mg total) by mouth 2 (two) times daily. Use for 3 days then as needed. 20 tablet 0  . HYDROcodone-acetaminophen (NORCO/VICODIN) 5-325 MG per tablet Take 1-2 tablets by mouth every 6 (six) hours as needed.     Marland Kitchen losartan (COZAAR) 50 MG tablet Take 50 mg by mouth every morning.    . lovastatin (MEVACOR) 40 MG tablet Take 40 mg by mouth every morning.     . memantine (NAMENDA) 10 MG tablet Take 10 mg by mouth 2 (two) times daily.    . Multiple Vitamin (MULITIVITAMIN WITH MINERALS) TABS Take 1 tablet by mouth every morning.     . Omega-3 Fatty Acids (FISH OIL)  1200 MG CAPS Take 1 capsule by mouth every morning.     . tiotropium (SPIRIVA) 18 MCG inhalation capsule Place 18 mcg into inhaler and inhale daily.      No current facility-administered medications for this visit.    SURGICAL HISTORY:  Past Surgical History  Procedure Laterality Date  . Appendectomy      1957  . Elbow surgery  2008    right elbow  . Tonsillectomy    . Lung biospy    . Lung removal, partial Left 12/16/12    left upper resection    REVIEW OF SYSTEMS:  A comprehensive review of systems was negative except for: Constitutional: positive for fatigue Respiratory: positive for dyspnea on exertion and pleurisy/chest pain   PHYSICAL EXAMINATION: General appearance: alert, cooperative, fatigued and no distress Head: Normocephalic, without obvious abnormality, atraumatic Neck: no adenopathy Lymph nodes: Cervical, supraclavicular, and axillary nodes normal. Resp: clear to auscultation bilaterally Cardio: regular rate and rhythm, S1, S2 normal, no murmur, click, rub or gallop GI: soft, non-tender; bowel sounds normal; no masses,  no organomegaly Extremities: extremities normal, atraumatic, no cyanosis or edema  ECOG PERFORMANCE STATUS: 1 - Symptomatic but completely ambulatory  Blood pressure 97/75, pulse 76, temperature 98.2 F (36.8 C), temperature source Oral, resp. rate 18, height 5' (1.524 m), weight 162 lb 4.8 oz (73.619 kg).  LABORATORY DATA: Lab Results  Component Value Date   WBC 7.3 01/10/2015   HGB 13.3 01/10/2015   HCT 40.8 01/10/2015   MCV 95.5 01/10/2015   PLT 270 01/10/2015      Chemistry      Component Value Date/Time   NA 138 01/10/2015 0957   NA 134* 09/22/2013 0600   K 4.5 01/10/2015 0957   K 3.6 09/22/2013 0600   CL 96 09/22/2013 0600   CO2 28 01/10/2015 0957   CO2 31 09/22/2013 0600   BUN 12.2 01/10/2015 0957   BUN 12 09/22/2013 0600   CREATININE 0.7 01/10/2015 0957   CREATININE 0.53 09/22/2013 0600   CREATININE 0.76 11/28/2011 1220        Component Value Date/Time   CALCIUM 9.5 01/10/2015 0957   CALCIUM 9.7 09/22/2013 0600   ALKPHOS 49 01/10/2015 0957   ALKPHOS 58 01/07/2012 1349   AST 18 01/10/2015 0957   AST 14 01/07/2012 1349   ALT 13 01/10/2015 0957   ALT 9 01/07/2012 1349   BILITOT 0.56 01/10/2015 0957   BILITOT 0.5 01/07/2012 1349       RADIOGRAPHIC STUDIES: Ct Chest W Contrast  01/10/2015   CLINICAL DATA:  Lung cancer  EXAM: CT CHEST WITH CONTRAST  TECHNIQUE: Multidetector CT imaging of the chest was performed during intravenous contrast administration.  CONTRAST:  37mL OMNIPAQUE IOHEXOL 300 MG/ML  SOLN  COMPARISON:  07/06/2014  FINDINGS: Mediastinum: The heart size appears normal. There is no pericardial effusion. The trachea is patent and midline. Normal appearance of the esophagus. Calcified atherosclerotic plaque involves the thoracic aorta as well as the LAD, RCA and left circumflex coronary arteries. No mediastinal or hilar adenopathy identified.  Lungs/Pleura: There is no pleural effusion. A left upper lobectomy has been performed. In the area of previous subpleural scarring in the left lower lobe there is an invasive chest wall mass which measures 2.2 x 4.4 x 3.3 cm. There is associated destructive change involving left fifth rib, image 29/series 2.  Upper Abdomen: The adrenal glands appear normal. The visualized portions of the spleen and liver are within normal limits.  Musculoskeletal: Destructive changes involving the left fifth rib as above. No additional sites of bone involving identified.  IMPRESSION: 1. Today's findings are consistent with recurrent tumor within the left chest wall. Associated destructive bone changes involve the left fifth rib. 2. No evidence for distant metastatic disease. 3. Atherosclerotic disease including multi vessel coronary artery calcification.   Electronically Signed   By: Kerby Moors M.D.   On: 01/10/2015 12:36   ASSESSMENT AND PLAN: This is a very pleasant 79 years old  white female with questionable recurrent non-small cell lung cancer presented with hypermetabolic nodular thickening associated with rib destruction along the left chest wall. She status post palliative radiotherapy to that area under the care of Dr. Sondra Come completed on 09/08/2013. The recent CT scan of the chest showed findings consistent with recurrent tumor within the left chest wall with associated destructive bone changes involving the left fifth rib. I discussed the scan results with the patient and her sister.  I recommended for her to have CT-guided biopsy of this lesion for confirmation of disease recurrence before considering the patient for either more palliative radiotherapy or systemic chemotherapy. I will see her back for follow-up visit in 2 weeks for reevaluation and discussion of her treatment options. I strongly advise her again to quit smoking and offered her smoke cessation program but again the patient is unwilling to quit smoking. The patient was advised to call immediately if she has any concerning symptoms in the interval. The patient voices understanding of current disease status and treatment options and is in agreement with the current care plan.  All questions were answered. The patient knows to call the clinic with any problems, questions or concerns. We can certainly see the patient much sooner if necessary.  Disclaimer: This note was dictated with voice recognition software. Similar sounding words can inadvertently be transcribed and may not be corrected upon review.

## 2015-01-21 ENCOUNTER — Other Ambulatory Visit: Payer: Self-pay | Admitting: Radiology

## 2015-01-24 ENCOUNTER — Other Ambulatory Visit: Payer: Self-pay | Admitting: Radiology

## 2015-01-25 ENCOUNTER — Encounter (HOSPITAL_COMMUNITY): Payer: Self-pay

## 2015-01-25 ENCOUNTER — Ambulatory Visit (HOSPITAL_COMMUNITY)
Admission: RE | Admit: 2015-01-25 | Discharge: 2015-01-25 | Disposition: A | Discharge status: Home / Self Care | Payer: Medicare Other | Source: Ambulatory Visit | Attending: Internal Medicine | Admitting: Internal Medicine

## 2015-01-25 DIAGNOSIS — R222 Localized swelling, mass and lump, trunk: Secondary | ICD-10-CM | POA: Insufficient documentation

## 2015-01-25 DIAGNOSIS — Z85118 Personal history of other malignant neoplasm of bronchus and lung: Secondary | ICD-10-CM | POA: Diagnosis not present

## 2015-01-25 DIAGNOSIS — C384 Malignant neoplasm of pleura: Secondary | ICD-10-CM | POA: Insufficient documentation

## 2015-01-25 DIAGNOSIS — C3412 Malignant neoplasm of upper lobe, left bronchus or lung: Secondary | ICD-10-CM

## 2015-01-25 DIAGNOSIS — R918 Other nonspecific abnormal finding of lung field: Secondary | ICD-10-CM | POA: Diagnosis present

## 2015-01-25 LAB — CBC WITH DIFFERENTIAL/PLATELET
Basophils Absolute: 0 10*3/uL (ref 0.0–0.1)
Basophils Relative: 1 % (ref 0–1)
Eosinophils Absolute: 0.1 10*3/uL (ref 0.0–0.7)
Eosinophils Relative: 2 % (ref 0–5)
HEMATOCRIT: 39.8 % (ref 36.0–46.0)
HEMOGLOBIN: 13.3 g/dL (ref 12.0–15.0)
Lymphocytes Relative: 29 % (ref 12–46)
Lymphs Abs: 2 10*3/uL (ref 0.7–4.0)
MCH: 32.4 pg (ref 26.0–34.0)
MCHC: 33.4 g/dL (ref 30.0–36.0)
MCV: 96.8 fL (ref 78.0–100.0)
MONO ABS: 0.5 10*3/uL (ref 0.1–1.0)
MONOS PCT: 8 % (ref 3–12)
NEUTROS ABS: 4.2 10*3/uL (ref 1.7–7.7)
Neutrophils Relative %: 62 % (ref 43–77)
Platelets: 240 10*3/uL (ref 150–400)
RBC: 4.11 MIL/uL (ref 3.87–5.11)
RDW: 14.1 % (ref 11.5–15.5)
WBC: 6.8 10*3/uL (ref 4.0–10.5)

## 2015-01-25 LAB — PROTIME-INR
INR: 0.99 (ref 0.00–1.49)
Prothrombin Time: 13.2 seconds (ref 11.6–15.2)

## 2015-01-25 LAB — APTT: aPTT: 31 seconds (ref 24–37)

## 2015-01-25 MED ORDER — HYDROCODONE-ACETAMINOPHEN 5-325 MG PO TABS
1.0000 | ORAL_TABLET | ORAL | Status: DC | PRN
  Filled 2015-01-25: qty 2

## 2015-01-25 MED ORDER — SODIUM CHLORIDE 0.9 % IV SOLN
INTRAVENOUS | Status: DC
  Administered 2015-01-25: 12:00:00 via INTRAVENOUS

## 2015-01-25 MED ORDER — FENTANYL CITRATE 0.05 MG/ML IJ SOLN
INTRAMUSCULAR | Status: AC | PRN
  Administered 2015-01-25: 25 ug via INTRAVENOUS

## 2015-01-25 MED ORDER — FENTANYL CITRATE 0.05 MG/ML IJ SOLN
INTRAMUSCULAR | Status: AC
  Filled 2015-01-25: qty 4

## 2015-01-25 MED ORDER — MIDAZOLAM HCL 2 MG/2ML IJ SOLN
INTRAMUSCULAR | Status: AC | PRN
  Administered 2015-01-25: 0.5 mg via INTRAVENOUS

## 2015-01-25 MED ORDER — MIDAZOLAM HCL 2 MG/2ML IJ SOLN
INTRAMUSCULAR | Status: AC
  Filled 2015-01-25: qty 6

## 2015-01-25 NOTE — Procedures (Signed)
CT core bx L chest wall lesion 10Y x3 No complication No blood loss. See complete dictation in [facility].

## 2015-01-25 NOTE — Discharge Instructions (Signed)
Leave dressing on for 24 hours.  You may shower after 24 hours.  Please remove the dressing before you shower.    Conscious Sedation, Adult, Care After Refer to this sheet in the next few weeks. These instructions provide you with information on caring for yourself after your procedure. Your health care provider may also give you more specific instructions. Your treatment has been planned according to current medical practices, but problems sometimes occur. Call your health care provider if you have any problems or questions after your procedure. WHAT TO EXPECT AFTER THE PROCEDURE  After your procedure:  You may feel sleepy, clumsy, and have poor balance for several hours.  Vomiting may occur if you eat too soon after the procedure. HOME CARE INSTRUCTIONS  Do not participate in any activities where you could become injured for at least 24 hours. Do not:  Drive.  Swim.  Ride a bicycle.  Operate heavy machinery.  Cook.  Use power tools.  Climb ladders.  Work from a high place.  Do not make important decisions or sign legal documents until you are improved.  If you vomit, drink water, juice, or soup when you can drink without vomiting. Make sure you have little or no nausea before eating solid foods.  Only take over-the-counter or prescription medicines for pain, discomfort, or fever as directed by your health care provider.  Make sure you and your family fully understand everything about the medicines given to you, including what side effects may occur.  You should not drink alcohol, take sleeping pills, or take medicines that cause drowsiness for at least 24 hours.  If you smoke, do not smoke without supervision.  If you are feeling better, you may resume normal activities 24 hours after you were sedated.  Keep all appointments with your health care provider. SEEK MEDICAL CARE IF:  Your skin is pale or bluish in color.  You continue to feel nauseous or vomit.  Your  pain is getting worse and is not helped by medicine.  You have bleeding or swelling.  You are still sleepy or feeling clumsy after 24 hours. SEEK IMMEDIATE MEDICAL CARE IF:  You develop a rash.  You have difficulty breathing.  You develop any type of allergic problem.  You have a fever. MAKE SURE YOU:  Understand these instructions.  Will watch your condition.  Will get help right away if you are not doing well or get worse. Document Released: 09/16/2013 Document Reviewed: 09/16/2013 [facility] Patient Information 2015 [place], [place]. This information is not intended to replace advice given to you by your health care provider. Make sure you discuss any questions you have with your health care provider. Needle Biopsy Care After These instructions give you information on caring for yourself after your procedure. Your doctor may also give you more specific instructions. Call your doctor if you have any problems or questions after your procedure. HOME CARE  Rest for 4 hours after your biopsy, except for getting up to go to the bathroom or as told.  Keep the places where the needles were put in clean and dry.  Do not put powder or lotion on the sites.  Do not shower until 24 hours after the test. Remove all bandages (dressings) before showering.  Remove all bandages at least once every day. Gently clean the sites with soap and water. Keep putting a new bandage on until the skin is closed. Finding out the results of your test Ask your doctor when your test results  will be ready. Make sure you follow up and get the test results. GET HELP RIGHT AWAY IF:   You have shortness of breath or trouble breathing.  You have pain or cramping in your belly (abdomen).  You feel sick to your stomach (nauseous) or throw up (vomit).  Any of the places where the needles were put in:  Are puffy (swollen) or red.  Are sore or hot to the touch.  Are draining yellowish-white fluid  (pus).  Are bleeding after 10 minutes of pressing down on the site. Have someone keep pressing on any place that is bleeding until you see a doctor.  You have any unusual pain that will not stop.  You have a fever. If you go to the emergency room, tell the nurse that you had a biopsy. Take this paper with you to show the nurse. MAKE SURE YOU:   Understand these instructions.  Will watch your condition.  Will get help right away if you are not doing well or get worse. Document Released: 11/08/2008 Document Revised: 02/18/2012 Document Reviewed: 11/08/2008 [facility] Patient Information 2015 [place], [place]. This information is not intended to replace advice given to you by your health care provider. Make sure you discuss any questions you have with your health care provider.

## 2015-01-25 NOTE — H&P (Signed)
Chief Complaint: "I am here for a biopsy."  Referring Physician(s): Mohamed,Mohamed  History of Present Illness: Andrea [name] is a 79 y.o. female with concern for recurrent non-small cell lung cancer. She has a history of LUL lobectomy and lymph node dissection and palliative radiotherapy along lateral chest well. Recent images reveal hypermetabolic left chest wall mass and destructive rib changes with concern for recurrence. She has been seen by Dr. Julien Nordmann on 01/17/15 and scheduled today for image guided left rib/chest wall mass biopsy. She denies any chest pain, change in chronic shortness of breath or palpitations. She denies any active signs of bleeding or excessive bruising. She denies any recent fever or chills. The patient does have COPD and uses 3L [place] at night. She has previously tolerated sedation without complications.     Past Medical History  Diagnosis Date  . Hypertension   . PVD (peripheral vascular disease)   . History of shingles 1985  . Sleep related hypoxia     USES CPAP  . Tobacco abuse   . Complication of anesthesia     confusion after surgery  . COPD (chronic obstructive pulmonary disease)     Emphezma  . Hypoxia, sleep related     Oxygen 2 liters at night, HOB  approx 30-45 degree  . Arthritis     Hands  . DEMENTIA   . Hearing loss   . Acute encephalopathy 09/19/2013  . History of radiation therapy 08/17/2013-09/08/2013    42.5 gray to left lateral chest  . Cancer     l lung cancer  . Lung cancer 12/06/11    left upper lung    Past Surgical History  Procedure Laterality Date  . Appendectomy      1957  . Elbow surgery  2008    right elbow  . Tonsillectomy    . Lung biospy    . Lung removal, partial Left 12/16/12    left upper resection    Allergies: Review of patient's allergies indicates no known allergies.  Medications: Prior to Admission medications   Medication Sig Start Date End Date Taking? Authorizing Provider    HYDROcodone-acetaminophen (NORCO/VICODIN) 5-325 MG per tablet Take 1-2 tablets by mouth every 6 (six) hours as needed.  01/14/15  Yes Historical Provider, MD  acetaminophen (TYLENOL) 500 MG tablet Take 500 mg by mouth 2 (two) times daily.    Historical Provider, MD  albuterol (PROVENTIL HFA;VENTOLIN HFA) 108 (90 BASE) MCG/ACT inhaler Inhale 2 puffs into the lungs every 4 (four) hours as needed for shortness of breath.     Historical Provider, MD  albuterol (PROVENTIL) (2.5 MG/3ML) 0.083% nebulizer solution Take 3 mLs (2.5 mg total) by nebulization every 4 (four) hours as needed for shortness of breath. Use nebs every 8 hours x 3 days then use as needed. 09/22/13   Eugenie Filler, MD  aspirin EC 81 MG tablet Take 81 mg by mouth every morning.     Historical Provider, MD  cholecalciferol (VITAMIN D) 1000 UNITS tablet Take 2,000 Units by mouth every morning.     Historical Provider, MD  Fluticasone-Salmeterol (ADVAIR) 250-50 MCG/DOSE AEPB Inhale 1 puff into the lungs every 12 (twelve) hours.      Historical Provider, MD  galantamine (RAZADYNE ER) 16 MG 24 hr capsule Take 16 mg by mouth every morning.    Historical Provider, MD  guaiFENesin (MUCINEX) 600 MG 12 hr tablet Take 2 tablets (1,200 mg total) by mouth 2 (two) times daily. Use  for 3 days then as needed. 09/22/13   Eugenie Filler, MD  losartan (COZAAR) 50 MG tablet Take 50 mg by mouth every morning.    Historical Provider, MD  lovastatin (MEVACOR) 40 MG tablet Take 40 mg by mouth every morning.     Historical Provider, MD  memantine (NAMENDA) 10 MG tablet Take 10 mg by mouth 2 (two) times daily.    Historical Provider, MD  Multiple Vitamin (MULITIVITAMIN WITH MINERALS) TABS Take 1 tablet by mouth every morning.     Historical Provider, MD  Omega-3 Fatty Acids (FISH OIL) 1200 MG CAPS Take 1 capsule by mouth every morning.     Historical Provider, MD  tiotropium (SPIRIVA) 18 MCG inhalation capsule Place 18 mcg into inhaler and inhale daily.      Historical Provider, MD     Family History  Problem Relation Age of Onset  . Stroke Mother   . Stroke Father   . Colon cancer Sister   . Anesthesia problems Neg Hx   . Alzheimer's disease Mother   . Lung cancer Father     was a smoker    History   Social History  . Marital Status: Widowed    Spouse Name: N/A  . Number of Children: 0  . Years of Education: N/A   Occupational History  . Retired [employer] Professor    Social History Main Topics  . Smoking status: Current Every Day Smoker -- 1.50 packs/day for 40 years    Types: Cigarettes  . Smokeless tobacco: Never Used  . Alcohol Use: Yes     Comment: rarely  . Drug Use: No  . Sexual Activity: Not on file   Other Topics Concern  . None   Social History Narrative    Review of Systems: A 12 point ROS discussed and pertinent positives are indicated in the HPI above.  All other systems are negative.  Review of Systems  Vital Signs: BP 134/52 mmHg  Pulse 75  Temp(Src) 97.7 F (36.5 C) (Oral)  Resp 18  SpO2 92%  Physical Exam  Constitutional: No distress.  HENT:  Head: Normocephalic and atraumatic.  Neck: No tracheal deviation present.  Cardiovascular: Normal rate and regular rhythm.  Exam reveals no gallop and no friction rub.   No murmur heard. Pulmonary/Chest: Effort normal and breath sounds normal. No respiratory distress. She has no wheezes.  Abdominal: Soft. Bowel sounds are normal. She exhibits no distension. There is no tenderness.  Neurological: She is alert.  Skin: She is not diaphoretic.   Mallampati Score:  MD Evaluation Airway: WNL Heart: WNL Abdomen: WNL Chest/ Lungs: WNL ASA  Classification: 3 Mallampati/Airway Score: Two  Imaging: Ct Chest W Contrast  01/10/2015   CLINICAL DATA:  Lung cancer  EXAM: CT CHEST WITH CONTRAST  TECHNIQUE: Multidetector CT imaging of the chest was performed during intravenous contrast administration.  CONTRAST:  74mL OMNIPAQUE IOHEXOL 300 MG/ML  SOLN   COMPARISON:  07/06/2014  FINDINGS: Mediastinum: The heart size appears normal. There is no pericardial effusion. The trachea is patent and midline. Normal appearance of the esophagus. Calcified atherosclerotic plaque involves the thoracic aorta as well as the LAD, RCA and left circumflex coronary arteries. No mediastinal or hilar adenopathy identified.  Lungs/Pleura: There is no pleural effusion. A left upper lobectomy has been performed. In the area of previous subpleural scarring in the left lower lobe there is an invasive chest wall mass which measures 2.2 x 4.4 x 3.3 cm. There is associated destructive  change involving left fifth rib, image 29/series 2.  Upper Abdomen: The adrenal glands appear normal. The visualized portions of the spleen and liver are within normal limits.  Musculoskeletal: Destructive changes involving the left fifth rib as above. No additional sites of bone involving identified.  IMPRESSION: 1. Today's findings are consistent with recurrent tumor within the left chest wall. Associated destructive bone changes involve the left fifth rib. 2. No evidence for distant metastatic disease. 3. Atherosclerotic disease including multi vessel coronary artery calcification.   Electronically Signed   By: Kerby Moors M.D.   On: 01/10/2015 12:36    Labs:  CBC:  Recent Labs  02/17/14 1019 07/06/14 1005 01/10/15 0957 01/25/15 1138  WBC 6.5 6.6 7.3 6.8  HGB 13.5 13.7 13.3 13.3  HCT 40.5 41.3 40.8 39.8  PLT 213 248 270 240    COAGS:  Recent Labs  01/25/15 1138  INR 0.99  APTT 31    BMP:  Recent Labs  02/17/14 1019 07/06/14 1005 01/10/15 0957  NA 141 136 138  K 4.4 4.3 4.5  CO2 26 28 28   GLUCOSE 92 104 84  BUN 13.4 9.9 12.2  CALCIUM 10.0 9.7 9.5  CREATININE 0.8 0.7 0.7    LIVER FUNCTION TESTS:  Recent Labs  02/17/14 1019 07/06/14 1005 01/10/15 0957  BILITOT 0.73 0.64 0.56  AST 21 18 18   ALT 16 14 13   ALKPHOS 48 38* 49  PROT 7.4 7.3 7.4  ALBUMIN 3.8 3.7  3.7    Assessment and Plan: Recurrent non-small cell lung cancer history of LUL lobectomy and lymph node dissection and palliative radiotherapy along lateral chest well Hypermetabolic left chest wall mass and destructive rib changes concern for recurrence.  Seen by Dr. Julien Nordmann 01/17/15 Scheduled today for image guided left rib/chest wall mass biopsy with moderate sedation Patient has been NPO, no blood thinners taken, labs reviewed Risks and Benefits discussed with the patient. All of the patient's questions were answered, patient is agreeable to proceed. Consent signed and in chart. COPD uses [place] 3L at night    Thank you for this interesting consult.  I greatly enjoyed meeting [employer] and look forward to participating in their care.  SignedHedy Jacob 01/25/2015, 12:12 PM   I spent a total of 30 minutes in face to face in clinical consultation, greater than 50% of which was counseling/coordinating care for left rib/chest wall mass.

## 2015-01-31 ENCOUNTER — Telehealth: Payer: Self-pay | Admitting: Internal Medicine

## 2015-01-31 ENCOUNTER — Encounter: Payer: Self-pay | Admitting: Oncology

## 2015-01-31 ENCOUNTER — Ambulatory Visit (HOSPITAL_BASED_OUTPATIENT_CLINIC_OR_DEPARTMENT_OTHER): Payer: Medicare Other | Admitting: Oncology

## 2015-01-31 VITALS — BP 109/47 | HR 73 | Temp 98.2°F | Resp 18 | Ht 60.0 in | Wt 160.6 lb

## 2015-01-31 DIAGNOSIS — Z720 Tobacco use: Secondary | ICD-10-CM

## 2015-01-31 DIAGNOSIS — R0789 Other chest pain: Secondary | ICD-10-CM

## 2015-01-31 DIAGNOSIS — C384 Malignant neoplasm of pleura: Secondary | ICD-10-CM

## 2015-01-31 DIAGNOSIS — C3412 Malignant neoplasm of upper lobe, left bronchus or lung: Secondary | ICD-10-CM

## 2015-01-31 NOTE — Progress Notes (Signed)
[facility] [phone]   [phone]  OFFICE PROGRESS NOTE  Jerlyn Ly, MD [place] [address]  DIAGNOSIS: Recurrent non-small cell lung cancer initially diagnosed as stage IIB (T3, N0, M0) non-small cell lung cancer consistent with invasive squamous cell carcinoma diagnosed in December of 2012.   PRIOR THERAPY:  1) Status post left upper lobectomy with lymph node dissection on 12/17/2011 under the care of Dr. Arlyce Dice.  2) status post palliative radiotherapy to recurrent disease along the left lateral chest well completed on 09/08/2013 under the care of Dr. Sondra Come.  CURRENT THERAPY: Observation.   CHEMOTHERAPY INTENT: N/A  CURRENT # OF CHEMOTHERAPY CYCLES: 0  CURRENT ANTIEMETICS: N/A  CURRENT SMOKING STATUS: Current smoker. Not willing to quit. Given counseling  ORAL CHEMOTHERAPY AND CONSENT: None  CURRENT BISPHOSPHONATES USE: None  PAIN MANAGEMENT: 3/10 left chest wall pain  NARCOTICS INDUCED CONSTIPATION: None  LIVING WILL AND CODE STATUS: Full code initially but not for long term resuscitation.   INTERVAL HISTORY: Andrea [name] 79 y.o. female returns to the clinic today for followup visit accompanied by her sister. Since her last visit with [place], she had a biopsy of the mass on her left ribs. The patient is feeling fine today with no specific complaints except for shortness of breath. She has intermittent left-sided chest pain. She takes hydrocodone 2 tablets every 6 hours for pain which helps. She denied having any significant weight loss or night sweats. She has no nausea or vomiting. She continues to smoke and unwilling to quit. She is here to discuss the results of her biopsy.   MEDICAL HISTORY: Past Medical History  Diagnosis Date  . Hypertension   . PVD (peripheral vascular disease)   . History of shingles 1985  . Sleep related hypoxia     USES CPAP  . Tobacco abuse   . Complication of anesthesia    confusion after surgery  . COPD (chronic obstructive pulmonary disease)     Emphezma  . Hypoxia, sleep related     Oxygen 2 liters at night, HOB  approx 30-45 degree  . Arthritis     Hands  . DEMENTIA   . Hearing loss   . Acute encephalopathy 09/19/2013  . History of radiation therapy 08/17/2013-09/08/2013    42.5 gray to left lateral chest  . Cancer     l lung cancer  . Lung cancer 12/06/11    left upper lung    ALLERGIES:  has No Known Allergies.  MEDICATIONS:  Current Outpatient Prescriptions  Medication Sig Dispense Refill  . acetaminophen (TYLENOL) 500 MG tablet Take 500 mg by mouth every 6 (six) hours as needed.    Marland Kitchen albuterol (PROVENTIL HFA;VENTOLIN HFA) 108 (90 BASE) MCG/ACT inhaler Inhale 2 puffs into the lungs every 4 (four) hours as needed for wheezing or shortness of breath.    Marland Kitchen aspirin 81 MG tablet Take 81 mg by mouth daily.    . cholecalciferol (VITAMIN D) 1000 UNITS tablet Take 1,000 Units by mouth daily.    Marland Kitchen guaiFENesin (MUCINEX) 600 MG 12 hr tablet Take by mouth 2 (two) times daily.    Marland Kitchen HYDROcodone-acetaminophen (NORCO/VICODIN) 5-325 MG per tablet Take 1 tablet by mouth every 6 (six) hours as needed for moderate pain.    Marland Kitchen losartan (COZAAR) 50 MG tablet Take 50 mg by mouth daily.    Marland Kitchen lovastatin (MEVACOR) 40 MG tablet Take 40 mg by mouth at bedtime.    Marland Kitchen  memantine (NAMENDA) 10 MG tablet Take 10 mg by mouth 2 (two) times daily.    . Multiple Vitamin (MULTIVITAMIN) tablet Take 1 tablet by mouth daily.    . Omega-3 Fatty Acids (FISH OIL CONCENTRATE PO) Take 1 capsule by mouth daily.    Marland Kitchen ADVAIR DISKUS 250-50 MCG/DOSE AEPB Place 1 puff into the nose 2 (two) times daily.  5  . galantamine (RAZADYNE ER) 16 MG 24 hr capsule Take 16 mg by mouth daily. with food  11  . SPIRIVA HANDIHALER 18 MCG inhalation capsule Take 1 capsule by mouth daily.     No current facility-administered medications for this visit.    SURGICAL HISTORY:  Past Surgical History  Procedure  Laterality Date  . Appendectomy      1957  . Elbow surgery  2008    right elbow  . Tonsillectomy    . Lung biospy    . Lung removal, partial Left 12/16/12    left upper resection    REVIEW OF SYSTEMS:  A comprehensive review of systems was negative except for: Constitutional: positive for fatigue Respiratory: positive for dyspnea on exertion and pleurisy/chest pain   PHYSICAL EXAMINATION: General appearance: alert, cooperative, fatigued and no distress Head: Normocephalic, without obvious abnormality, atraumatic Neck: no adenopathy Lymph nodes: Cervical, supraclavicular, and axillary nodes normal. Resp: clear to auscultation bilaterally Cardio: regular rate and rhythm, S1, S2 normal, no murmur, click, rub or gallop GI: soft, non-tender; bowel sounds normal; no masses,  no organomegaly Extremities: extremities normal, atraumatic, no cyanosis or edema  ECOG PERFORMANCE STATUS: 1 - Symptomatic but completely ambulatory  Blood pressure 109/47, pulse 73, temperature 98.2 F (36.8 C), temperature source Oral, resp. rate 18, height 5' (1.524 m), weight 160 lb 9.6 oz (72.848 kg).  LABORATORY DATA: Lab Results  Component Value Date   WBC 6.8 01/25/2015   HGB 13.3 01/25/2015   HCT 39.8 01/25/2015   MCV 96.8 01/25/2015   PLT 240 01/25/2015      Chemistry      Component Value Date/Time   NA 138 01/10/2015 0957   NA 134* 09/22/2013 0600   K 4.5 01/10/2015 0957   K 3.6 09/22/2013 0600   CL 96 09/22/2013 0600   CO2 28 01/10/2015 0957   CO2 31 09/22/2013 0600   BUN 12.2 01/10/2015 0957   BUN 12 09/22/2013 0600   CREATININE 0.7 01/10/2015 0957   CREATININE 0.53 09/22/2013 0600   CREATININE 0.76 11/28/2011 1220      Component Value Date/Time   CALCIUM 9.5 01/10/2015 0957   CALCIUM 9.7 09/22/2013 0600   ALKPHOS 49 01/10/2015 0957   ALKPHOS 58 01/07/2012 1349   AST 18 01/10/2015 0957   AST 14 01/07/2012 1349   ALT 13 01/10/2015 0957   ALT 9 01/07/2012 1349   BILITOT 0.56  01/10/2015 0957   BILITOT 0.5 01/07/2012 1349       RADIOGRAPHIC STUDIES: Ct Chest W Contrast  01/10/2015   CLINICAL DATA:  Lung cancer  EXAM: CT CHEST WITH CONTRAST  TECHNIQUE: Multidetector CT imaging of the chest was performed during intravenous contrast administration.  CONTRAST:  40mL OMNIPAQUE IOHEXOL 300 MG/ML  SOLN  COMPARISON:  07/06/2014  FINDINGS: Mediastinum: The heart size appears normal. There is no pericardial effusion. The trachea is patent and midline. Normal appearance of the esophagus. Calcified atherosclerotic plaque involves the thoracic aorta as well as the LAD, RCA and left circumflex coronary arteries. No mediastinal or hilar adenopathy identified.  Lungs/Pleura: There is no  pleural effusion. A left upper lobectomy has been performed. In the area of previous subpleural scarring in the left lower lobe there is an invasive chest wall mass which measures 2.2 x 4.4 x 3.3 cm. There is associated destructive change involving left fifth rib, image 29/series 2.  Upper Abdomen: The adrenal glands appear normal. The visualized portions of the spleen and liver are within normal limits.  Musculoskeletal: Destructive changes involving the left fifth rib as above. No additional sites of bone involving identified.  IMPRESSION: 1. Today's findings are consistent with recurrent tumor within the left chest wall. Associated destructive bone changes involve the left fifth rib. 2. No evidence for distant metastatic disease. 3. Atherosclerotic disease including multi vessel coronary artery calcification.   Electronically Signed   By: Kerby Moors M.D.   On: 01/10/2015 12:36   Ct Biopsy  01/25/2015   CLINICAL DATA:  History of lung carcinoma. Hypermetabolic left pleural-based mass with chest wall invasion.  EXAM: CT GUIDED CORE BIOPSY OF LEFT PLEURAL MASS  ANESTHESIA/SEDATION: Intravenous Fentanyl and Versed were administered as conscious sedation during continuous cardiorespiratory monitoring by the  radiology RN, with a total moderate sedation time of 7 minutes.  PROCEDURE: The procedure risks, benefits, and alternatives were explained to the patient. Questions regarding the procedure were encouraged and answered. The patient understands and consents to the procedure.  Select axial scans through the mid thorax were obtained. The lesion was localized and an appropriate skin entry site determined and marked.  The operative field was prepped with Betadinein a sterile fashion, and a sterile drape was applied covering the operative field. A sterile gown and sterile gloves were used for the procedure. Local anesthesia was provided with 1% Lidocaine.  Under CT fluoroscopic guidance, a 17 gauge trocar needle was advanced to the margin of the lesion. Once needle tip position was confirmed, coaxial 18-gauge core biopsy samples were obtained, submitted in formalin to surgical pathology. The guide needle was removed. Postprocedure scans show no significant hemorrhage. No pneumothorax.  The patient tolerated the procedure well.  COMPLICATIONS: None immediate  FINDINGS: Limited CT scans Confirma and redemonstrate the left pleural-based lateral chest wall mass with chest wall and lateral fifth rib invasion. Core biopsy of the lesion was performed under CT fluoroscopic guidance.  IMPRESSION: 1. Technically successful CT-guided core biopsy of left pleural/chest wall mass.   Electronically Signed   By: Lucrezia Europe M.D.   On: 01/25/2015 15:50   PATHOLOGY: Pleura, biopsy, left chest wall - INVASIVE SQUAMOUS CELL CARCINOMA, CLINICALLY RECURRENT.  ASSESSMENT AND PLAN: This is a very pleasant 79 year old white female with recurrent non-small cell lung cancer presented with hypermetabolic nodular thickening associated with rib destruction along the left chest wall. She status post palliative radiotherapy to that area under the care of Dr. Sondra Come completed on 09/08/2013. The recent CT scan of the chest showed findings consistent  with recurrent tumor within the left chest wall with associated destructive bone changes involving the left fifth rib. The biopsy result confirmed the diagnosis of recurrent tumor.  The patient was seen and examined with Dr. Julien Nordmann who discussed the biopsy results with the patient and her sister. We recommended referral to radiation oncology for consideration of radiation to the left ribs. If Dr. Sondra Come is not able to perform radiation, then we will proceed with chemotherapy. The patient's sister had questions today including questions about side effects of chemotherapy, chemotherapy agents that we would use, length of treatment, and chance of response. Dr.  Mohamed address these questions and indicated that we would likely use carboplatin and Taxol for 6 cycles given every 3 weeks. Side effects can include myelosuppression, alopecia, nausea, vomiting. He discusses approximate 50% chance of the chemotherapy working and if it does not work and we will consider other options such as immunotherapy. If the patient elects not to do anything, then his cancer would likely spread to other parts of her body. However, we have explained to the patient and her sister that we are not yet ready to move the direction of chemotherapy we will await further recommendation from radiation oncology. Referral to radiation oncology has been made.  We will see her back for follow-up visit in approximately one month reevaluation and discussion of her treatment options. He will see her sooner if she is not a candidate for radiation oncology to discuss treatment with chemotherapy.  I strongly advise her again to quit smoking and offered her smoke cessation program but again the patient is unwilling to quit smoking. The patient was advised to call immediately if she has any concerning symptoms in the interval. The patient voices understanding of current disease status and treatment options and is in agreement with the current care  plan.  All questions were answered. The patient knows to call the clinic with any problems, questions or concerns. We can certainly see the patient much sooner if necessary.  Mikey Bussing, DNP, AGPCNP-BC, AOCNP  ADDENDUM: Hematology/Oncology Attending: I had a face to face encounter with the patient. I recommended her care plan. This is a very pleasant 79 years old white female with recurrent non-small cell lung cancer presented with left chest wall lesion. She recently underwent CT-guided biopsy of the lesion and it was consistent with recurrent squamous cell carcinoma. I discussed with the patient has treatment options including repeat course of radiotherapy to the left chest wall lesion versus systemic chemotherapy with carboplatin and paclitaxel. I will refer the patient to Dr. Randa Ngo for discussion of the radiotherapy option. If she is not a candidate for repeat radiotherapy to the left chest wall, I would consider the patient for systemic chemotherapy with carboplatin and paclitaxel as discussed above. I briefly discussed the adverse effects of this treatment with the patient and her sister. She will come back for follow-up visit in one month for reevaluation. For smoke cessation, I strongly encouraged the patient to quit smoking. She was advised to call immediately if she has any concerning symptoms in the interval.  Disclaimer: This note was dictated with voice recognition software. Similar sounding words can inadvertently be transcribed and may be missed upon review. Eilleen Kempf., MD 01/31/2015

## 2015-01-31 NOTE — Telephone Encounter (Signed)
Gave avs & calendar for March. °

## 2015-02-04 NOTE — Progress Notes (Signed)
Histology and Location of Primary Cancer: Recurrent non-small cell lung cancer    Sites of Visceral and Bony Metastatic Disease: left chest wall mass found on CT Scan of her chest.  Location(s) of Symptomatic Metastases: left chest wall mass involving the left fifth rib - biopsy from 01/25/15 shows:  Diagnosis Pleura, biopsy, left chest wall - INVASIVE SQUAMOUS CELL CARCINOMA, CLINICALLY RECURRENT.  Past/Anticipated chemotherapy by medical oncology, if any: possible carboplatin and Taxol for 6 cycles given every 3 weeks if radiation can't be done.  Pain on a scale of 0-10 is: patient rating pain in her left side at a 2/10.  She is taking 2 tablets of hydrocodone/acetaminophen 5/325 mg q 6 hours.  Her caregiver has noticed that the patient is leaning to her left side when walking and sitting.  Ambulatory status? Walker? Wheelchair?: wheelchair.  She uses a walker at home.  SAFETY ISSUES:  Prior radiation? 08/17/2013-09/08/2013 42.5 gray to left lateral chest  Pacemaker/ICD? no  Possible current pregnancy? no  Is the patient on methotrexate? no  Current Complaints / other details:  Patient is here with her sister and is her POA.  Patient has shortness of breath with activity.  She uses 3 l of oxygen at night.  BP 118/56 mmHg  Pulse 66  Temp(Src) 98 F (36.7 C) (Oral)  Ht 5' (1.524 m)  Wt 158 lb 6.4 oz (71.85 kg)  BMI 30.94 kg/m2  SpO2 98%

## 2015-02-09 ENCOUNTER — Ambulatory Visit
Admission: RE | Admit: 2015-02-09 | Discharge: 2015-02-09 | Disposition: A | Discharge status: Home / Self Care | Payer: Medicare Other | Source: Ambulatory Visit | Attending: Radiation Oncology | Admitting: Radiation Oncology

## 2015-02-09 ENCOUNTER — Encounter: Payer: Self-pay | Admitting: Radiation Oncology

## 2015-02-09 VITALS — BP 118/56 | HR 66 | Temp 98.0°F | Resp 16 | Ht 60.0 in | Wt 158.4 lb

## 2015-02-09 DIAGNOSIS — C3412 Malignant neoplasm of upper lobe, left bronchus or lung: Secondary | ICD-10-CM | POA: Insufficient documentation

## 2015-02-09 DIAGNOSIS — Z923 Personal history of irradiation: Secondary | ICD-10-CM | POA: Diagnosis not present

## 2015-02-09 DIAGNOSIS — R222 Localized swelling, mass and lump, trunk: Secondary | ICD-10-CM

## 2015-02-09 DIAGNOSIS — Z7982 Long term (current) use of aspirin: Secondary | ICD-10-CM | POA: Insufficient documentation

## 2015-02-09 DIAGNOSIS — Z7951 Long term (current) use of inhaled steroids: Secondary | ICD-10-CM | POA: Diagnosis not present

## 2015-02-09 DIAGNOSIS — Z79891 Long term (current) use of opiate analgesic: Secondary | ICD-10-CM | POA: Insufficient documentation

## 2015-02-09 DIAGNOSIS — Z79899 Other long term (current) drug therapy: Secondary | ICD-10-CM | POA: Diagnosis not present

## 2015-02-09 NOTE — Progress Notes (Signed)
Radiation Oncology         [phone] ________________________________  Name: JUDIT AWAD MRN: [id number]  Date: 02/09/2015  DOB: 04-26-1936  Reevaluation Note  CC: Jerlyn Ly, MD  Curt Bears, MD    ICD-9-CM ICD-10-CM   1. Malignant neoplasm of upper lobe of left lung 162.3 C34.12     Diagnosis:   Recurrent non-small cell lung cancer  Interval Since Last Radiation:  One year and 5 months, the patient completed 42.5 gray and 17 fractions directed at the area of recurrence along the left lateral chest wall area. The patient was found to have an isolated recurrence in this area with associated pain after prior surgery.  Narrative:  The patient returns today for further evaluation at the courtesy of Dr. Earlie Server.  Patient has been doing well up until recently with frequent follow-ups in medical oncology. Patient had complete relief of her pain along the left chest wall area after her prior radiation therapy. Routine chest CT scan however recently showed recurrence along the left lateral chest wall area. The patient proceeded to undergo biopsy of this area which revealed squamous cell carcinoma consistent with the patient's prior lung cancer. She is now seen in radiation oncology for consideration for additional treatment.  In retrospect the patient has been having some pain along the left lateral chest wall for approximately one to 2 months.  She is now requiring narcotic pain medication for this issue.                    ALLERGIES:  has No Known Allergies.  Meds: Current Outpatient Prescriptions  Medication Sig Dispense Refill  . ADVAIR DISKUS 250-50 MCG/DOSE AEPB Place 1 puff into the nose 2 (two) times daily.  5  . albuterol (2.5 MG/3ML) 0.083% NEBU 3 mL, albuterol (5 MG/ML) 0.5% NEBU 0.5 mL Inhale 2.5 mg into the lungs every 12 (twelve) hours.    Marland Kitchen albuterol (PROVENTIL HFA;VENTOLIN HFA) 108 (90 BASE) MCG/ACT inhaler Inhale 2 puffs into the lungs every 4 (four) hours as  needed for wheezing or shortness of breath.    Marland Kitchen aspirin 81 MG tablet Take 81 mg by mouth daily.    . cholecalciferol (VITAMIN D) 1000 UNITS tablet Take 1,000 Units by mouth daily.    Marland Kitchen dextromethorphan-guaiFENesin (MUCINEX DM) 30-600 MG per 12 hr tablet Take 2 tablets by mouth daily.    Marland Kitchen galantamine (RAZADYNE ER) 16 MG 24 hr capsule Take 80 mg by mouth daily. with food  11  . HYDROcodone-acetaminophen (NORCO/VICODIN) 5-325 MG per tablet Take 1 tablet by mouth every 6 (six) hours as needed for moderate pain.    Marland Kitchen losartan (COZAAR) 50 MG tablet Take 50 mg by mouth daily.    Marland Kitchen lovastatin (MEVACOR) 40 MG tablet Take 40 mg by mouth at bedtime.    . memantine (NAMENDA) 10 MG tablet Take 28 mg by mouth daily.     . Multiple Vitamin (MULTIVITAMIN) tablet Take 1 tablet by mouth daily.    . Omega-3 Fatty Acids (FISH OIL CONCENTRATE PO) Take 1 capsule by mouth daily.    Marland Kitchen omeprazole (PRILOSEC) 20 MG capsule Take 20 mg by mouth daily.    Marland Kitchen SPIRIVA HANDIHALER 18 MCG inhalation capsule Take 1 capsule by mouth daily.    Marland Kitchen acetaminophen (TYLENOL) 500 MG tablet Take 500 mg by mouth every 6 (six) hours as needed.    Marland Kitchen guaiFENesin (MUCINEX) 600 MG 12 hr tablet Take by mouth 2 (two) times daily.  No current facility-administered medications for this encounter.    Physical Findings: The patient is in no acute distress. Patient is alert and oriented.  height is 5' (1.524 m) and weight is 158 lb 6.4 oz (71.85 kg). Her oral temperature is 98 F (36.7 C). Her blood pressure is 118/56 and her pulse is 66. Her oxygen saturation is 98%. .  No palpable supraclavicular or axillary adenopathy. Lungs are clear to auscultation. The heart has regular rhythm and rate area summation of the left lateral chest wall area reveals no soft tissue mass. The patient has minimal point tenderness.  She has tattoos along the left chest and breast area from her prior radiation therapy  Lab Findings: Lab Results  Component Value Date    WBC 6.8 01/25/2015   HGB 13.3 01/25/2015   HCT 39.8 01/25/2015   MCV 96.8 01/25/2015   PLT 240 01/25/2015    Radiographic Findings: Ct Chest W Contrast  01/10/2015   CLINICAL DATA:  Lung cancer  EXAM: CT CHEST WITH CONTRAST  TECHNIQUE: Multidetector CT imaging of the chest was performed during intravenous contrast administration.  CONTRAST:  40mL OMNIPAQUE IOHEXOL 300 MG/ML  SOLN  COMPARISON:  07/06/2014  FINDINGS: Mediastinum: The heart size appears normal. There is no pericardial effusion. The trachea is patent and midline. Normal appearance of the esophagus. Calcified atherosclerotic plaque involves the thoracic aorta as well as the LAD, RCA and left circumflex coronary arteries. No mediastinal or hilar adenopathy identified.  Lungs/Pleura: There is no pleural effusion. A left upper lobectomy has been performed. In the area of previous subpleural scarring in the left lower lobe there is an invasive chest wall mass which measures 2.2 x 4.4 x 3.3 cm. There is associated destructive change involving left fifth rib, image 29/series 2.  Upper Abdomen: The adrenal glands appear normal. The visualized portions of the spleen and liver are within normal limits.  Musculoskeletal: Destructive changes involving the left fifth rib as above. No additional sites of bone involving identified.  IMPRESSION: 1. Today's findings are consistent with recurrent tumor within the left chest wall. Associated destructive bone changes involve the left fifth rib. 2. No evidence for distant metastatic disease. 3. Atherosclerotic disease including multi vessel coronary artery calcification.   Electronically Signed   By: Kerby Moors M.D.   On: 01/10/2015 12:36   Ct Biopsy  01/25/2015   CLINICAL DATA:  History of lung carcinoma. Hypermetabolic left pleural-based mass with chest wall invasion.  EXAM: CT GUIDED CORE BIOPSY OF LEFT PLEURAL MASS  ANESTHESIA/SEDATION: Intravenous Fentanyl and Versed were administered as conscious  sedation during continuous cardiorespiratory monitoring by the radiology RN, with a total moderate sedation time of 7 minutes.  PROCEDURE: The procedure risks, benefits, and alternatives were explained to the patient. Questions regarding the procedure were encouraged and answered. The patient understands and consents to the procedure.  Select axial scans through the mid thorax were obtained. The lesion was localized and an appropriate skin entry site determined and marked.  The operative field was prepped with Betadinein a sterile fashion, and a sterile drape was applied covering the operative field. A sterile gown and sterile gloves were used for the procedure. Local anesthesia was provided with 1% Lidocaine.  Under CT fluoroscopic guidance, a 17 gauge trocar needle was advanced to the margin of the lesion. Once needle tip position was confirmed, coaxial 18-gauge core biopsy samples were obtained, submitted in formalin to surgical pathology. The guide needle was removed. Postprocedure scans show no significant  hemorrhage. No pneumothorax.  The patient tolerated the procedure well.  COMPLICATIONS: None immediate  FINDINGS: Limited CT scans Confirma and redemonstrate the left pleural-based lateral chest wall mass with chest wall and lateral fifth rib invasion. Core biopsy of the lesion was performed under CT fluoroscopic guidance.  IMPRESSION: 1. Technically successful CT-guided core biopsy of left pleural/chest wall mass.   Electronically Signed   By: Lucrezia Europe M.D.   On: 01/25/2015 15:50   549. 02/01/15 11:10:01 AM (gt) FINAL DIAGNOSIS Diagnosis Pleura, biopsy, left chest wall - INVASIVE SQUAMOUS CELL CARCINOMA, CLINICALLY RECURRENT. Diagnosis Note The tumor is similar in appearance to the previous biopsy ([id number]). Dr. Gari Crown has reviewed the case.   Impression:  Recurrent non-small cell lung cancer. The patient's area of recurrence is in her previous high-dose radiation field.  The patient is not a  candidate for surgery for this issue given the extensive involvement along the chest wall area. The patient would be a potential candidate for chemotherapy but given the patient's age and other medical issues she may have difficulty tolerating this treatment. The patient would be a candidate for limited course of treatment using focused therapy to treat minimal normal tissue surrounding the the area of recurrence. The patient understands that this is a risk for potential fibrosis and rib fracture resulting in pain but she also understands with tumor progression this could result in further pain and rib fracture.   Plan:  Simulation and planning March 3.  I anticipate approximately 2-3 weeks of radiation therapy  ____________________________________ Blair Promise, MD

## 2015-02-09 NOTE — Progress Notes (Signed)
Please see the Nurse Progress Note in the MD Initial Consult Encounter for this patient. 

## 2015-02-10 ENCOUNTER — Telehealth: Payer: Self-pay | Admitting: Oncology

## 2015-02-10 ENCOUNTER — Ambulatory Visit
Admission: RE | Admit: 2015-02-10 | Discharge: 2015-02-10 | Disposition: A | Discharge status: Home / Self Care | Payer: Medicare Other | Source: Ambulatory Visit | Attending: Radiation Oncology | Admitting: Radiation Oncology

## 2015-02-10 ENCOUNTER — Ambulatory Visit: Payer: Medicare Other | Admitting: Radiation Oncology

## 2015-02-10 DIAGNOSIS — C3412 Malignant neoplasm of upper lobe, left bronchus or lung: Secondary | ICD-10-CM

## 2015-02-10 NOTE — Telephone Encounter (Signed)
Left a message for Andrea [name] regarding Andrea [name]'s CT [facility] appointment today.  Requested a return call.

## 2015-02-10 NOTE — Telephone Encounter (Signed)
Andrea [name] called back and said Andrea [name] does not want to have radiation treatment because she does not think there will be a long term benefit.  Advised her that per Dr. Sondra Come, it may help her pain control.  Andrea [name] said that Andrea [name] was aware of that and does not want the treatment.  Advised her to call if they change their mind.  Notified CT SIM to cancel the appointment for today.

## 2015-02-25 ENCOUNTER — Telehealth: Payer: Self-pay | Admitting: Internal Medicine

## 2015-02-25 NOTE — Telephone Encounter (Signed)
returned call and s.w. patient and confirmed appts.

## 2015-02-28 ENCOUNTER — Ambulatory Visit: Payer: Medicare Other | Admitting: Internal Medicine

## 2015-02-28 ENCOUNTER — Other Ambulatory Visit: Payer: Medicare Other

## 2015-02-28 ENCOUNTER — Telehealth: Payer: Self-pay | Admitting: Internal Medicine

## 2015-02-28 NOTE — Telephone Encounter (Signed)
patient called to cancel appt....done....patient will call back to resched

## 2015-04-10 DEATH — deceased

## 2016-02-16 IMAGING — CT NM PET TUM IMG RESTAG (PS) SKULL BASE T - THIGH
8 series · 25 of 25 positions shown · non-contrast
Comparison: PET-CT 07/20/2013, CT 02/17/2014

CLINICAL DATA: Subsequent treatment strategy for lung carcinoma.
Query recurrence in left hemi thorax.

EXAM:
NUCLEAR MEDICINE PET SKULL BASE TO THIGH
TECHNIQUE: 9.4 mCi F-18 FDG was injected intravenously. Full-ring PET imaging
was performed from the skull base to thigh after the radiotracer. CT
data was obtained and used for attenuation correction and anatomic
localization.
FASTING BLOOD GLUCOSE:  Value: 87 mg/dl

[Series 3: pet sk_thigh ac · axial · 5.0mm · 4.07mm/px · z∈[-1254,-446]mm · 4 of 203 slices shown]
[im 1/203]
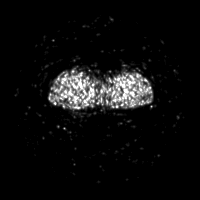
[im 68/203]
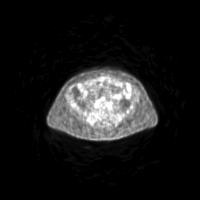
[im 135/203]
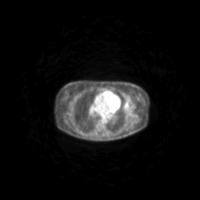
[im 203/203]
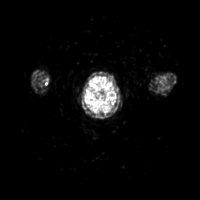

[Series 4: ct sk_thigh 5.0 b31f · axial · 5.0mm · 0.98mm/px · z∈[-1254,-446]mm · 5 of 203 slices shown]
[im 1/203]
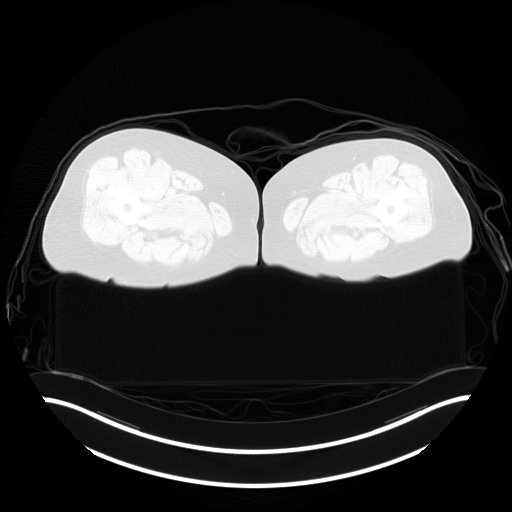
[im 51/203]
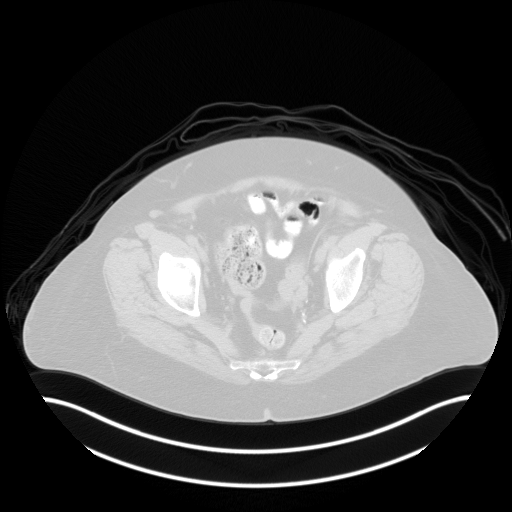
[im 102/203]
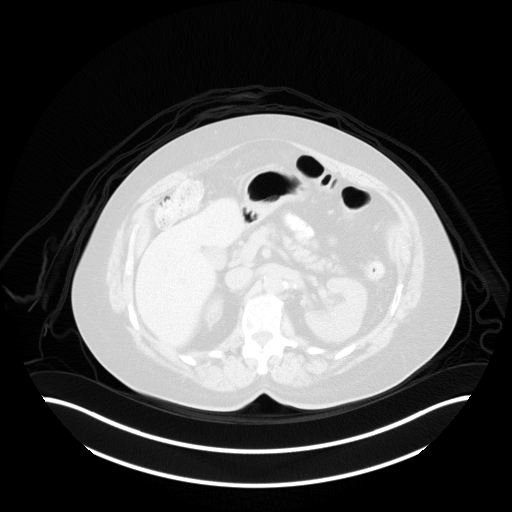
[im 152/203]
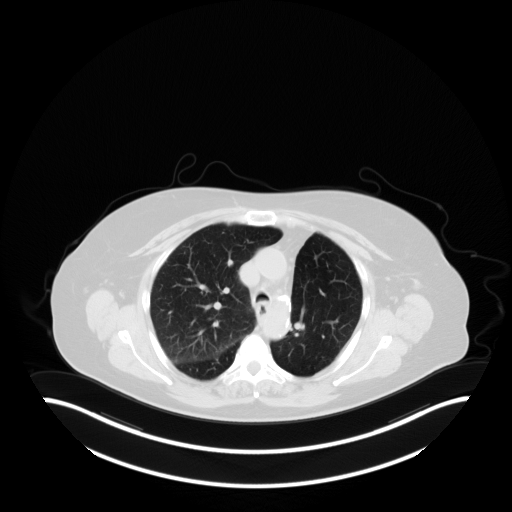
[im 203/203  brain]
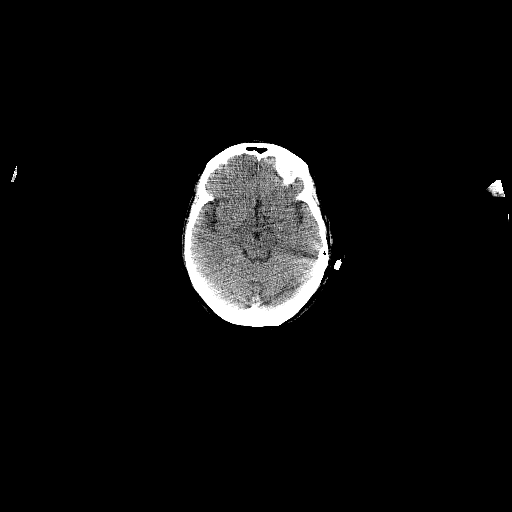

[Series 7: pet sk_thigh nac · axial · 5.0mm · 4.07mm/px · z∈[-1254,-446]mm · 5 of 203 slices shown]
[im 1/203]
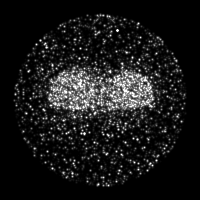
[im 51/203]
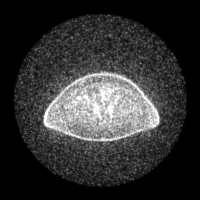
[im 102/203]
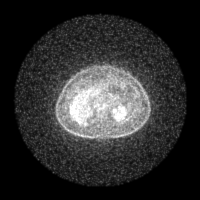
[im 152/203]
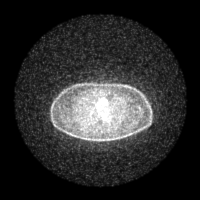
[im 203/203]
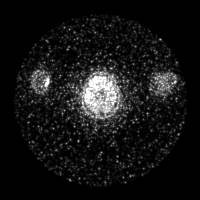

[Series 8: ct sk_thigh 5.0 b70f (id)_bone · axial · 5.0mm · 0.68mm/px · z∈[-856,-560]mm · 2 of 75 slices shown]
[im 1/75  bone]
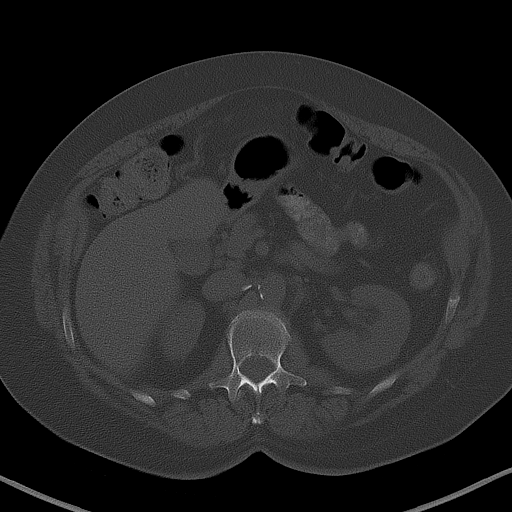
[im 75/75  bone]
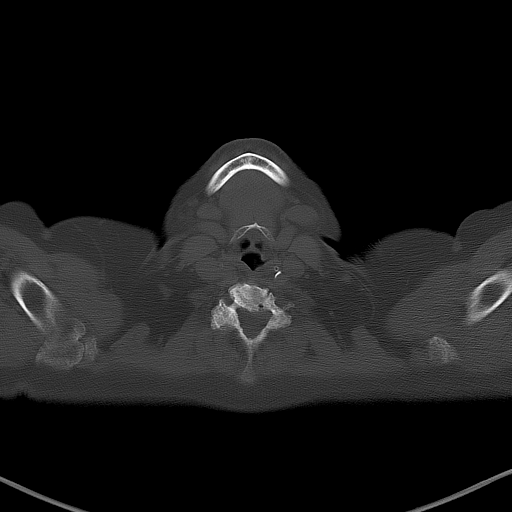

[Series 603: mip collection<mip range> · coronal · 1.68mm/px · 1 of 32 slices shown]
[im 1/32]
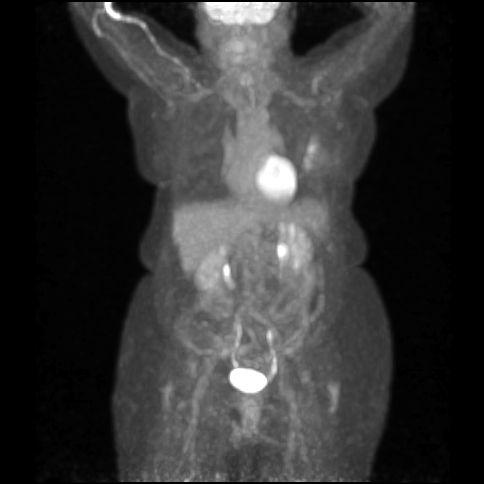

[Series 604: range-ct sk_thigh 5.0 (id)<alpha range> · 2 of 86 slices shown (1 of 2)]
[im 1/86]
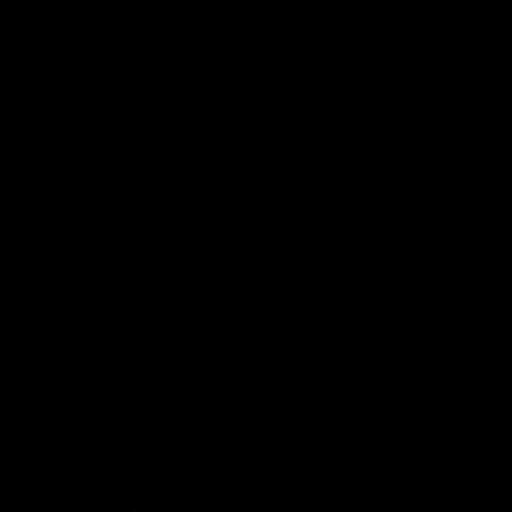
[im 86/86]
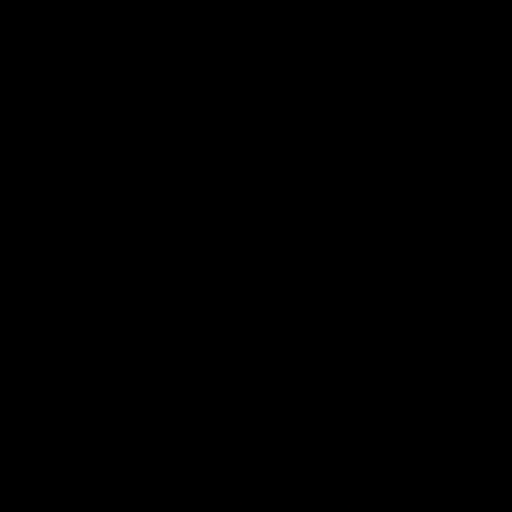

[Series 605: range-ct sk_thigh 5.0 (id)<alpha range> · 5 of 195 slices shown (2 of 2)]
[im 1/195]
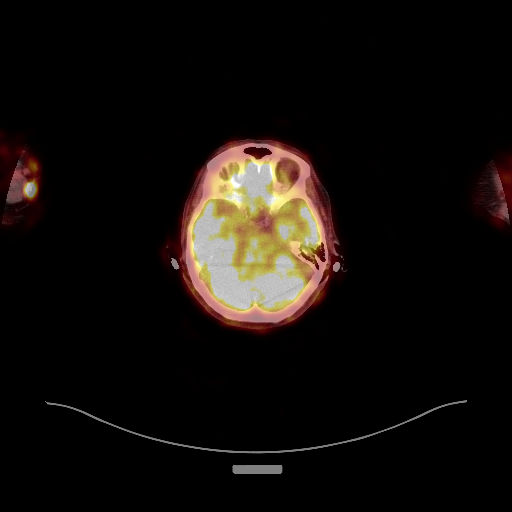
[im 49/195]
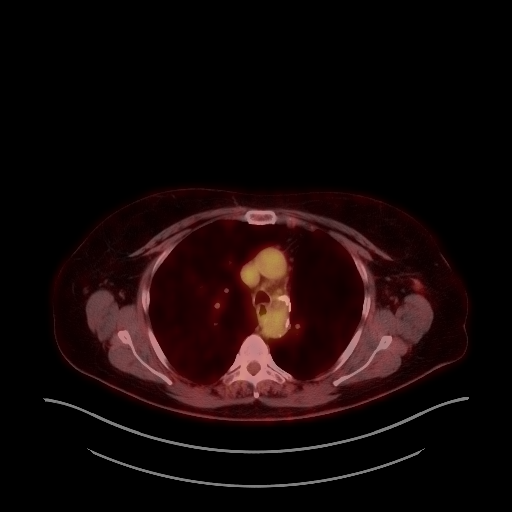
[im 98/195]
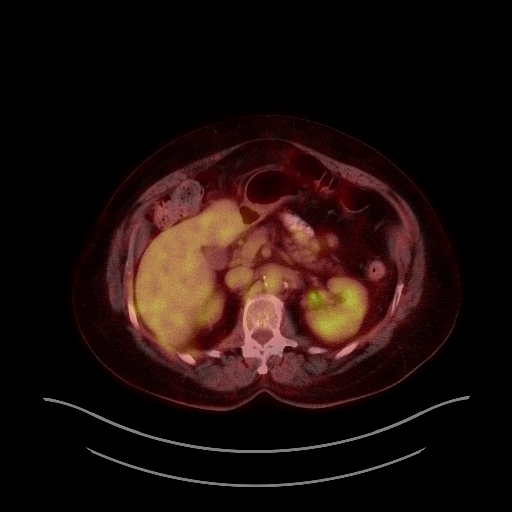
[im 146/195]
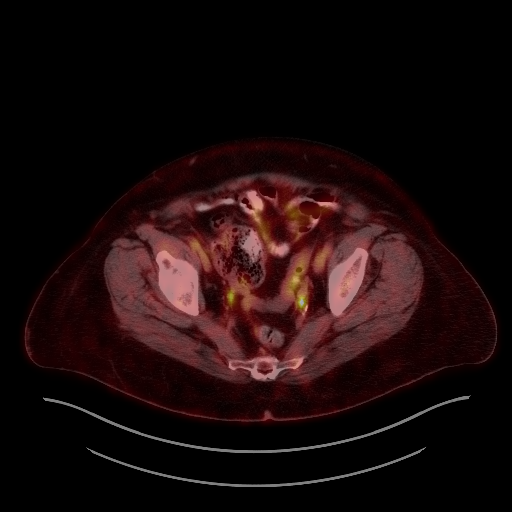
[im 195/195]
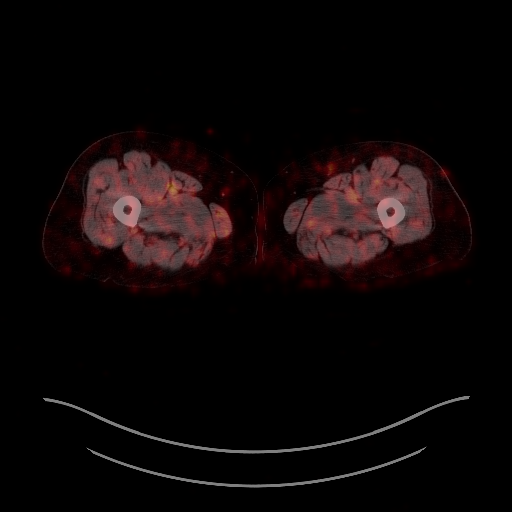

[Series 1076: results mm oncology reading · 5.0mm · 1.08mm/px · 1 of 1 slices shown]
[im 1/1]
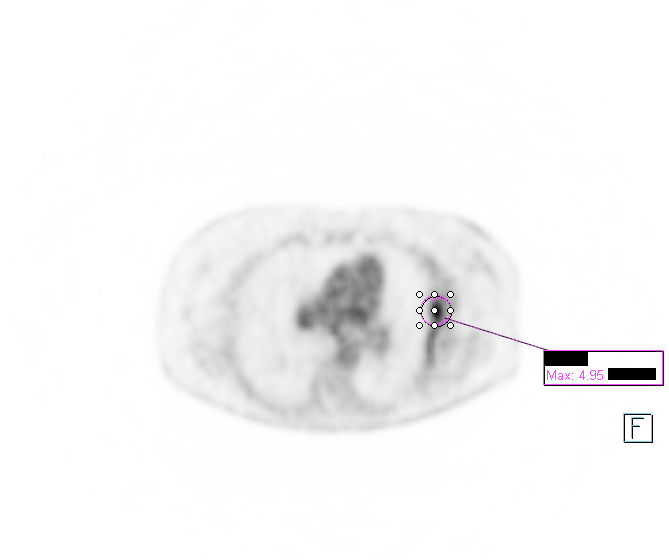

[25 of 25 positions shown; findings below may reference images not displayed]

FINDINGS: NECK

No hypermetabolic lymph nodes in the neck.

CHEST

Focus of hypermetabolic activity along the left lateral chest wall
associated with the nodular pleural parenchymal thickening (image
65) with SUV max equal 5.0. More medial portion of this
pleural-parenchymal thickening is less hypermetabolic. . The
activity along the left chest wall is much less intense than a
recurrence on comparison PET-CT scan of 07/20/2013 where the vessel
activity was SUV max equals 16.0.

No hypermetabolic pulmonary nodules. No hypermetabolic mediastinal
lymph nodes.

ABDOMEN/PELVIS

No abnormal hypermetabolic activity within the liver, pancreas,
adrenal glands, or spleen. No hypermetabolic lymph nodes in the
abdomen or pelvis.

SKELETON

No focal hypermetabolic activity to suggest skeletal metastasis.
IMPRESSION: 1. Moderate hypermetabolic activity associated with the
pleural-parenchymal thickening in the left lateral hemi thorax along
the chest wall is much less metabolically active than chest wall
recurrence on comparison PET-CT of 07/20/2013.
Therefore favor inflammatory change rather than recurrence although
cannot completely exclude recurrence without biopsy.
2. No additional evidence of metastatic disease.
